# Patient Record
Sex: Female | Born: 1975 | Race: White | Hispanic: No | Marital: Married | Smoking: Never smoker
Health system: Southern US, Community
[De-identification: ages and names within clinical notes are randomized; demographics above are authoritative.]

## PROBLEM LIST (undated history)

## (undated) DIAGNOSIS — C50919 Malignant neoplasm of unspecified site of unspecified female breast: Secondary | ICD-10-CM

## (undated) DIAGNOSIS — R102 Pelvic and perineal pain: Secondary | ICD-10-CM

## (undated) DIAGNOSIS — R3915 Urgency of urination: Secondary | ICD-10-CM

## (undated) DIAGNOSIS — N390 Urinary tract infection, site not specified: Secondary | ICD-10-CM

## (undated) DIAGNOSIS — N838 Other noninflammatory disorders of ovary, fallopian tube and broad ligament: Secondary | ICD-10-CM

## (undated) DIAGNOSIS — Z923 Personal history of irradiation: Secondary | ICD-10-CM

## (undated) DIAGNOSIS — E785 Hyperlipidemia, unspecified: Secondary | ICD-10-CM

## (undated) DIAGNOSIS — R319 Hematuria, unspecified: Secondary | ICD-10-CM

## (undated) DIAGNOSIS — N809 Endometriosis, unspecified: Secondary | ICD-10-CM

## (undated) HISTORY — DX: Hyperlipidemia, unspecified: E78.5

## (undated) HISTORY — DX: Malignant neoplasm of unspecified site of unspecified female breast: C50.919

## (undated) HISTORY — PX: AUGMENTATION MAMMAPLASTY: SUR837

---

## 1998-09-21 HISTORY — PX: ANAL SPHINCTEROTOMY: SHX1140

## 1999-09-22 HISTORY — PX: ABSCESS DRAINAGE: SHX1119

## 2000-01-30 ENCOUNTER — Other Ambulatory Visit: Admission: RE | Admit: 2000-01-30 | Discharge: 2000-01-30 | Payer: Self-pay | Admitting: Obstetrics and Gynecology

## 2000-02-23 ENCOUNTER — Other Ambulatory Visit: Admission: RE | Admit: 2000-02-23 | Discharge: 2000-02-23 | Payer: Self-pay | Admitting: Obstetrics and Gynecology

## 2000-03-09 ENCOUNTER — Emergency Department (HOSPITAL_COMMUNITY): Admission: EM | Admit: 2000-03-09 | Discharge: 2000-03-09 | Payer: Self-pay | Admitting: Emergency Medicine

## 2000-07-30 ENCOUNTER — Other Ambulatory Visit: Admission: RE | Admit: 2000-07-30 | Discharge: 2000-07-30 | Payer: Self-pay | Admitting: Obstetrics and Gynecology

## 2001-03-18 ENCOUNTER — Other Ambulatory Visit: Admission: RE | Admit: 2001-03-18 | Discharge: 2001-03-18 | Payer: Self-pay | Admitting: Obstetrics and Gynecology

## 2002-05-19 ENCOUNTER — Other Ambulatory Visit: Admission: RE | Admit: 2002-05-19 | Discharge: 2002-05-19 | Payer: Self-pay | Admitting: Obstetrics and Gynecology

## 2003-07-13 ENCOUNTER — Other Ambulatory Visit: Admission: RE | Admit: 2003-07-13 | Discharge: 2003-07-13 | Payer: Self-pay | Admitting: Obstetrics and Gynecology

## 2004-08-08 ENCOUNTER — Other Ambulatory Visit: Admission: RE | Admit: 2004-08-08 | Discharge: 2004-08-08 | Payer: Self-pay | Admitting: Obstetrics and Gynecology

## 2007-08-31 ENCOUNTER — Encounter (INDEPENDENT_AMBULATORY_CARE_PROVIDER_SITE_OTHER): Payer: Self-pay | Admitting: Obstetrics and Gynecology

## 2007-08-31 ENCOUNTER — Ambulatory Visit (HOSPITAL_COMMUNITY): Admission: RE | Admit: 2007-08-31 | Discharge: 2007-09-01 | Payer: Self-pay | Admitting: Obstetrics and Gynecology

## 2007-08-31 HISTORY — PX: OTHER SURGICAL HISTORY: SHX169

## 2011-02-03 NOTE — Op Note (Signed)
NAMEColman Miller                ACCOUNT NO.:  [id number]   MEDICAL RECORD NO.:  [id number]          PATIENT TYPE:  AMB   LOCATION:  SDC                           FACILITY:  [facility]   PHYSICIAN:  Miguel Aschoff, M.D.       DATE OF BIRTH:  03/09/1976   DATE OF PROCEDURE:  08/31/2007  DATE OF DISCHARGE:                               OPERATIVE REPORT   PREOPERATIVE DIAGNOSIS:  Left adnexal mass, probable endometrioma.   POSTOPERATIVE DIAGNOSIS:  Large endometrioma involving left ovary.  Endometriosis involving cul-de-sac and right ovary.   PROCEDURE:  Diagnostic laparoscopy, left oophorectomy.   SURGEON:  Dr. Miguel Aschoff.   ASSISTANT:  Dr. Conley Simmonds.   ANESTHESIA:  General.   COMPLICATIONS:  None.   JUSTIFICATION:  The patient is a 35 year old white female noted on  physical examination to have a large mass in the left adnexa and marked  nodularity involving the cul-de-sac.  The mass has been observed now  several times, and the ultrasound findings are consistent with large  endometrioma.  Because of symptoms associated with this, the patient  requested a procedure be done to deal with the endometrium, and she  presents now to undergo laparoscopy and laparotomy if indicated.  The  patient would like to preserve for fertility, and the plan is to proceed  with as minimal surgery as it takes to resolve this issue.  Risks and  benefits have been discussed with the patient including the possibility  of a laparotomy and a hysterectomy and bilateral salpingo-oophorectomy.   PROCEDURE:  The patient was taken to the operating room, placed in the  supine position.  General anesthesia was administered without  difficulty.  She was then placed into in the dorsal lithotomy position,  prepped and draped in the usual sterile fashion.  Bladder was  catheterized, and Hulka tenaculum was placed through the cervix and  held.  Attention was directed to the umbilicus where a small  infraumbilical  incision was made.  A Veress needle was inserted, and  then the abdomen was insufflated with 3 liters CO2.  Following  insufflation, the trocar to the laparoscope was placed followed by  laparoscope itself.  Then, under direct visualization, two accessory  ports were established.  A 5-mm port was established in the right lower  quadrant, and 11-mm port was established in the left lower performed  quadrant, both under direct visualization.  At this point, systematic  inspection of the pelvis was carried out.  This revealed the uterus to  be anterior, normal size and shape.  Anterior bladder and peritoneum was  unremarkable.  The right tube was inspected, traced out to its  fimbriated end which was noted be wrapped around the ovary, on the right  side, the right ovary was noted be adherent to the lateral pelvic  sidewall with the tube adherent to the right ovary.  The fimbriae,  however, were fine and delicate.  On the left side, there was a large  mass encompassing the ovary approximately 6 cm in size.  This was  somewhat adherent to the  lateral pelvic sidewall but was able to be  freed with blunt dissection.  It was apparent at this point this  represented a large endometrioma involving the ovary.  The left tube,  however, appeared to be within normal limits.  In the cul-de-sac, there  were marked implants noted within the cul-de-sac of endometriosis, and  there appeared to be some obliteration of the most distal portion of the  cul-de-sac with endometriosis.  Intestinal surfaces were unremarkable.  However, it did appear that the appendix was being pulled somewhere down  to the pelvis near the cul-de-sac.  There were no other remarkable  findings noted in the abdomen.  At this point, once the ovary was freed  off the lateral pelvic sidewall, it was elected to proceed with a left  oophorectomy.  This was done using the gyrus unit, and meso-ovarian  ligament was identified, grasped,  cauterized and then cut, and then this  continued along the meso-ovarian ligament until it was possible to free  the left ovary from the left tube.  Care was taken to avoid any damage  to the distal end of the fimbriated tube.  It was possible to remove the  ovary and _it was_________  placed in the cul-de-sac.  Inspection was  then made for hemostasis with the abdomen being irrigated with saline  via Nezhat suction irrigation unit.  Inspection for hemostasis appeared  to be excellent.  At that this point, the EndoCatch bag was introduced,  and the left ovary was placed into the EndoCatch bag and brought out  through the left lower quadrant incision prior to removing specimen from  the abdomen.  Final inspection was made for hemostasis.  Again,  hemostasis appeared be excellent.  In an effort to reduce the number of  adhesions, Intercede was placed in the cul-de-sac to try to avoid  adherence of the bowel and the tube into this area.  After the intercede  was placed, the left lower quadrant incision was extended proximally by  2 cm and then was possible to remove the specimen of the left ovary via  the EndoCatch bag.  Once this was done, all instruments were removed.  CO2 was allowed to escape in the left lower quadrant.  This incision was  closed using a figure-of-eight suture of 0 Vicryl to close the fascia.  Two such sutures were placed, and the fascia was successfully closed.  Subcutaneous tissue was then closed using interrupted 0 Vicryl suture,  and the remainder of the skin incisions were closed using subcuticular 4-  0 Vicryl.  Portal sites were then injected with a total of 10 mL of 1%  Xylocaine.  Steri-Strips were applied.  Hulka tenaculum was removed, and  the patient was reversed from the anesthetic and taken to the recovery  room in satisfactory condition.  The estimated blood loss was  approximately 20 mL.  The patient tolerated the procedure well and went  to the recovery  room in satisfactory condition.      Miguel Aschoff, M.D.  Electronically Signed     AR/MEDQ  D:  08/31/2007  T:  09/01/2007  Job:  [id number]

## 2011-02-06 NOTE — Discharge Summary (Signed)
NAMEColman Miller                ACCOUNT NO.:  [id number]   MEDICAL RECORD NO.:  [id number]          PATIENT TYPE:  OIB   LOCATION:  9316                          FACILITY:  [facility]   PHYSICIAN:  Miguel Aschoff, M.D.       DATE OF BIRTH:  1976-01-11   DATE OF ADMISSION:  08/31/2007  DATE OF DISCHARGE:  09/01/2007                               DISCHARGE SUMMARY   ADMISSION DIAGNOSIS:  Left adnexal mass.   FINAL DIAGNOSIS:  Endometrioma of left ovary.   OPERATIONS AND PROCEDURES:  Laparoscopy with left oophorectomy.   BRIEF HISTORY:  The patient is a 35 year old white female noted to have  a large mass on clinical examination and nodularity located in the cul-  de-sac.  Ultrasound examination suggested that this mass represented an  endometrioma and because of its nature, failure to resolve and clinical  symptoms, she presented to the hospital to undergo laparoscopy and  laparotomy, if indicated.   HOSPITAL COURSE:  Preoperative studies were obtained and then on  August 31, 2007 under general anesthesia laparoscopy was carried out  and confirmed the presence of a large endometrioma involving the left  ovary.  The endometrioma was removed by laparoscopically removing the  left ovary.  This was done without difficulty.  The patient's  postoperative course was essentially uncomplicated.  She tolerated  increased ambulation and diet well and by September 01, 2007 was in  satisfactory condition to be discharged home.   MEDICATIONS FOR HOME:  1. Tylox one every three hours as needed for pain.  2. Doxycycline one twice a day x7 days.   DIET:  She was sent home on a regular diet.   ACTIVITY:  Told to do no heavy lifting, place nothing per vagina for two  weeks.   FOLLOW-UP VISIT:  In four weeks and call if there were any problems such  as fever, pain or heavy bleeding.  Her hemoglobin on discharge was 1,  white count was 9800.   Final diagnosis on the pathology specimen revealed benign  ovary with  hemorrhagic cyst consistent with endometrioma.      Miguel Aschoff, M.D.  Electronically Signed     AR/MEDQ  D:  09/15/2007  T:  09/15/2007  Job:  [id number]

## 2011-06-29 LAB — APTT: aPTT: 34

## 2011-06-29 LAB — COMPREHENSIVE METABOLIC PANEL
Albumin: 4
BUN: 6
Calcium: 9.4
Creatinine, Ser: 0.63
Total Protein: 6.8

## 2011-06-29 LAB — DIFFERENTIAL
Lymphocytes Relative: 40
Monocytes Absolute: 0.4
Monocytes Relative: 10
Neutro Abs: 2.1

## 2011-06-29 LAB — CBC
HCT: 40.4
MCV: 91.1
MCV: 92.3
Platelets: 386
RBC: 3.54 — ABNORMAL LOW
RDW: 12.5
WBC: 9.8

## 2011-06-29 LAB — PROTIME-INR: Prothrombin Time: 13.4

## 2011-07-10 ENCOUNTER — Other Ambulatory Visit: Payer: Self-pay | Admitting: Obstetrics and Gynecology

## 2012-04-04 ENCOUNTER — Encounter (INDEPENDENT_AMBULATORY_CARE_PROVIDER_SITE_OTHER): Payer: Self-pay

## 2012-04-27 ENCOUNTER — Ambulatory Visit (INDEPENDENT_AMBULATORY_CARE_PROVIDER_SITE_OTHER): Payer: Self-pay | Admitting: Surgery

## 2012-05-30 ENCOUNTER — Ambulatory Visit (INDEPENDENT_AMBULATORY_CARE_PROVIDER_SITE_OTHER): Payer: Self-pay | Admitting: General Surgery

## 2012-06-21 HISTORY — PX: LAPAROSCOPIC CHOLECYSTECTOMY: SUR755

## 2013-07-14 ENCOUNTER — Other Ambulatory Visit: Payer: Self-pay | Admitting: Obstetrics and Gynecology

## 2013-08-27 ENCOUNTER — Inpatient Hospital Stay (HOSPITAL_COMMUNITY)
Admission: AD | Admit: 2013-08-27 | Discharge: 2013-08-27 | Disposition: A | Discharge status: Home / Self Care | Payer: 59 | Source: Ambulatory Visit | Attending: Obstetrics and Gynecology | Admitting: Obstetrics and Gynecology

## 2013-08-27 ENCOUNTER — Encounter (HOSPITAL_COMMUNITY): Payer: Self-pay | Admitting: *Deleted

## 2013-08-27 DIAGNOSIS — N83209 Unspecified ovarian cyst, unspecified side: Secondary | ICD-10-CM | POA: Insufficient documentation

## 2013-08-27 DIAGNOSIS — N390 Urinary tract infection, site not specified: Secondary | ICD-10-CM | POA: Insufficient documentation

## 2013-08-27 DIAGNOSIS — R109 Unspecified abdominal pain: Secondary | ICD-10-CM | POA: Insufficient documentation

## 2013-08-27 DIAGNOSIS — R319 Hematuria, unspecified: Secondary | ICD-10-CM | POA: Insufficient documentation

## 2013-08-27 DIAGNOSIS — N83201 Unspecified ovarian cyst, right side: Secondary | ICD-10-CM

## 2013-08-27 MED ORDER — OXYCODONE-ACETAMINOPHEN 5-325 MG PO TABS
1.0000 | ORAL_TABLET | Freq: Once | ORAL | Status: AC
  Administered 2013-08-27: 1 via ORAL
  Filled 2013-08-27: qty 1

## 2013-08-27 MED ORDER — PHENAZOPYRIDINE HCL 100 MG PO TABS
200.0000 mg | ORAL_TABLET | Freq: Once | ORAL | Status: AC
  Administered 2013-08-27: 200 mg via ORAL
  Filled 2013-08-27: qty 2

## 2013-08-27 MED ORDER — PHENAZOPYRIDINE HCL 200 MG PO TABS: 200.0000 mg | ORAL_TABLET | Freq: Three times a day (TID) | ORAL | Status: DC | PRN

## 2013-08-27 MED ORDER — NITROFURANTOIN MONOHYD MACRO 100 MG PO CAPS
100.0000 mg | ORAL_CAPSULE | Freq: Once | ORAL | Status: AC
  Administered 2013-08-27: 100 mg via ORAL
  Filled 2013-08-27: qty 1

## 2013-08-27 MED ORDER — NITROFURANTOIN MONOHYD MACRO 100 MG PO CAPS: 100.0000 mg | ORAL_CAPSULE | Freq: Two times a day (BID) | ORAL | Status: DC

## 2013-08-27 NOTE — MAU Provider Note (Signed)
History     CSN: [id number]  Arrival date and time: 08/27/13 4098   First Provider Initiated Contact with Patient 08/27/13 [phone]      Chief Complaint  Patient presents with  . Abdominal Pain  . Hematuria   HPI Carly Miller 37 y.o. transported via EMS from [facility] to [facility].  Client has known ovarian cyst and is planning to have surgery on Thursday for removal.  Has been having pain and urinary urgency since Thanksgiving.  Today the urgency was worse and she was having blood in her urine.  Was having difficulty managing the pain so she went to the closest ER.  Records reviewed from [facility].  Was treated for UTI today and given IV antibiotics.  Was given Morphine for the abdominal pain.  Currently her pain is 5/10 and she is feeling less pan than earlier today.   OB History   Grav Para Term Preterm Abortions TAB SAB Ect Mult Living   1 1 1  0 0 0 0 0 0 1      History reviewed. No pertinent past medical history.  Past Surgical History  Procedure Laterality Date  . Anal sphincteroplasty    . Salpingoophorectomy      left  . Cholecystectomy    . Abscess drainage      Family History  Problem Relation Age of Onset  . Asthma Mother   . Thyroid disease Mother   . Asthma Father   . Diabetes Father   . Thyroid disease Father   . Kidney disease Sister   . Thyroid disease Maternal Aunt   . Thyroid disease Maternal Uncle   . Cancer Maternal Grandmother     History  Substance Use Topics  . Smoking status: Never Smoker   . Smokeless tobacco: Not on file  . Alcohol Use: No    Allergies:  Allergies  Allergen Reactions  . Aleve [Naproxen Sodium]     "Feel like I'm choking when I take it"    Prescriptions prior to admission  Medication Sig Dispense Refill  . acetaminophen (TYLENOL) 500 MG tablet Take 1,000 mg by mouth every 6 (six) hours as needed.      Marland Kitchen HYDROcodone-acetaminophen (NORCO/VICODIN) 5-325 MG per tablet Take 1 tablet by mouth every 6 (six)  hours as needed for moderate pain.      . Multiple Vitamin (MULTIVITAMIN) capsule Take 1 capsule by mouth daily.        Review of Systems  Constitutional: Negative for fever.  Gastrointestinal: Positive for abdominal pain. Negative for nausea and vomiting.  Genitourinary: Positive for dysuria, urgency and hematuria.   Physical Exam   Blood pressure 96/54, pulse 94, temperature 98.1 F (36.7 C), temperature source Oral, resp. rate 18, last menstrual period 08/14/2013, SpO2 98.00%.  Physical Exam  Nursing note and vitals reviewed. Constitutional: She is oriented to person, place, and time. She appears well-developed and well-nourished.  HENT:  Head: Normocephalic.  Eyes: EOM are normal.  Neck: Neck supple.  GI: Soft. There is tenderness. There is no rebound and no guarding.  Musculoskeletal: Normal range of motion.  Some flank pain in the right side, but is not true CVA tenderness.  Neurological: She is alert and oriented to person, place, and time.  Skin: Skin is warm and dry.  Psychiatric: She has a normal mood and affect.    MAU Course  Procedures Urine culture ordered.  MDM 0510 Records from [place] reviewed. 4782  Consult with Dr. Vincente Poli re:  plan of care   Assessment and Plan  Ovarian cyst UTI  Plan Will give pyridium and Macrobid for UTI.  Will prescribe both for her. Will give Rx for vicodin for pain control. Will discharge.  Call the office if you are not doing well. Plan to have your scheduled surgery on Thursday.   Milano Rosevear 08/27/2013, 5:29 AM

## 2013-08-27 NOTE — Progress Notes (Signed)
Lilyan Punt Np notified of pt's increasing pain. Pain med ordered

## 2013-08-27 NOTE — Progress Notes (Signed)
Dr Vincente Poli notified of pt's arrival to MAU and status. Requested MAU provider to see pt and call MD. Lilyan Punt NP on unit and aware

## 2013-08-27 NOTE — MAU Note (Addendum)
Pt admitted to RM #6 via [facility] from [facility]. Pt has known mass R ovary and for surgery this Thurs with Dr Rana Snare. Pt presented to [facility] with pain and hematuria. Pt alert and oriented.

## 2013-08-28 LAB — URINE CULTURE
Colony Count: NO GROWTH
Culture: NO GROWTH

## 2013-08-29 ENCOUNTER — Encounter (HOSPITAL_BASED_OUTPATIENT_CLINIC_OR_DEPARTMENT_OTHER): Payer: Self-pay | Admitting: *Deleted

## 2013-08-30 ENCOUNTER — Encounter (HOSPITAL_BASED_OUTPATIENT_CLINIC_OR_DEPARTMENT_OTHER): Payer: Self-pay | Admitting: *Deleted

## 2013-08-30 NOTE — Progress Notes (Signed)
NPO AFTER MN. ARRIVE AT 0600. PRE-OP ORDERS PENDING.  NEEDS HG AND URING. MAY TAKE HYDROCODONE IF NEEDED W/ SIPS OF WATER.

## 2013-08-30 NOTE — H&P (Addendum)
Carly Miller presents today for evaluation of pelvic pain, history of right ovarian cyst.  She is scheduled for surgical evaluation of probable endometrioma in two days.  Carly Miller's history has progressively worsened just in this last week.  She has a known history of endometriosis and a right ovarian cyst.  It has gotten worse starting on Thanksgiving Day.  She presented shortly thereafter.  To the point that she has really been lying around in the bed and not being able to take care of herself or her family because she is in so much pain.  Currently on Vicodin.  She really does not like taking pain medicine.  She denies any fever or chills.  We had done an ultrasound shortly after that showing that there had been increase in the size of the cyst to about the 7-8 cm area.  The plan was to proceed laparoscopically on Thursday.  However, just two days ago on the weekend, she presented to the emergency room in [place] with gross hematuria and pain.  They did a CT scan which showed an 8.8 cm mass on the right side that showed it is possibly coming from the uterine wall or right adnexal area.  They transferred her by EMS to [facility] for further evaluation.  It showed a white count of 15, hemoglobin of 9.7.  She was placed on Macrobid and Pyridium and sent home with a UTI.  In the last 48 hours, her pain has actually improved fairly significantly.  She is currently not having gross hematuria, so the pain has improved.  She is not sure if this is due to the antibiotics or Pyridium, however, in the last day or so, she has really not tolerated the antibiotics well and has not been able to keep them down because she throws them up immediately.  In the past, has not had any problems with this.  Again, denies any fever or chills.  Really has not eaten a lot today other than some crackers. O: Physical exam:  Carly Miller appears to be fairly fatigued and in mild distress.  Abdomen is soft and nontender, nondistended, no rebound, no  guarding, no peritoneal signs, no flank pain.  Pelvic exam:  Uterus is anteverted and mobile.  You can palpate the mass on the right adnexal side just above the uterus and to the right adnexal area.  It is tender to deep palpation 2/5.  Urethra is nontender.  The bladder is relatively nontender.  Left adnexa is nontender.  Pelvic ultrasound was carried out without difficulty.  It shows the right ovary is now 6.1 cm, so it is actually much smaller than it was last week when we did an ultrasound.  Significantly smaller than the CT scan.  There is no free fluid.  There is flow seen to the ovary but there is no blood flow within the cyst.  The cyst appears to be more hemorrhagic in nature rather than endometriotic.  A&P: Pelvic pain, worsening, now with hematuria and possible urinary tract infection.  Urinalysis today was not really useful since she is on Pyridium.  It does show large red blood cells and leukocytes.  Discussed different options with Carly Miller.  Because she does not have gross hematuria we feel like she is in improving and she is afebrile.  Unable to keep the Macrobid down.  I wrote her for some Zofran.  I also changed her to Cipro 500 mg b.i.d. for 5 days.  Certainly if the pain or fever does  come back before this, she would need hospitalization with IV antibiotics.  Otherwise we discussed proceeding with surgical intervention.  We were going to proceed with laparoscopic evaluation of the abdomen and pelvis, ablation of endometriosis if there, evaluation of the right ovary and ovarian cyst.  If this is endometriotic or hemorrhagic, we are going to try to preserve the ovary but it is okay to remove the ovary if necessary, even though that would leave her essentially without ovaries and menopausal.  She does understand this and wishes that to occur if necessary.   If we have to remove the uterus, she is okay with doing a hysterectomy and removing the ovary.  She would prefer to do it at the time of this  surgery if the ovary has to be removed, she wants a hysterectomy as well.   Certainly if it is very involved as far as abscesses or bladder involvement or ureter involvement, we may need a urologist involved.  She does understand that as well.  So the plan would be hopefully to remove the cyst, preserve the ovary, or possibly remove the tube and ovary at that time.  We discussed the risks and benefits of the procedure at length including but not limited to risk of infection, bleeding, damage to bowel, bladder, ureter, obviously the ovary, the possibility of having to remove the uterus and/or ovary, removing the ovary.  She does give her informed consent and wishes to proceed.  I did spend nearly an hour and a half today face-to-face in evaluation and ultrasounds and reviewing records with Carly Miller today in the office.   Dineen Kid Rana Snare, MD/rg    This patient has been seen and examined.   All of her questions were answered.  Labs and vital signs reviewed.  Informed consent has been obtained.  The History and Physical is current. 08/31/13 0715 DL

## 2013-08-31 ENCOUNTER — Ambulatory Visit (HOSPITAL_BASED_OUTPATIENT_CLINIC_OR_DEPARTMENT_OTHER): Payer: 59 | Admitting: Anesthesiology

## 2013-08-31 ENCOUNTER — Encounter (HOSPITAL_COMMUNITY)
Admission: RE | Disposition: A | Discharge status: Home / Self Care | Payer: Self-pay | Source: Ambulatory Visit | Attending: Obstetrics and Gynecology

## 2013-08-31 ENCOUNTER — Encounter (HOSPITAL_BASED_OUTPATIENT_CLINIC_OR_DEPARTMENT_OTHER): Payer: Self-pay | Admitting: *Deleted

## 2013-08-31 ENCOUNTER — Encounter (HOSPITAL_BASED_OUTPATIENT_CLINIC_OR_DEPARTMENT_OTHER): Payer: 59 | Admitting: Anesthesiology

## 2013-08-31 ENCOUNTER — Inpatient Hospital Stay (HOSPITAL_BASED_OUTPATIENT_CLINIC_OR_DEPARTMENT_OTHER)
Admission: RE | Admit: 2013-08-31 | Discharge: 2013-09-03 | DRG: 743 | Disposition: A | Discharge status: Home / Self Care | Payer: 59 | Source: Ambulatory Visit | Attending: Obstetrics and Gynecology | Admitting: Obstetrics and Gynecology

## 2013-08-31 DIAGNOSIS — N80129 Deep endometriosis of ovary, unspecified ovary: Secondary | ICD-10-CM

## 2013-08-31 DIAGNOSIS — N80109 Endometriosis of ovary, unspecified side, unspecified depth: Principal | ICD-10-CM | POA: Diagnosis present

## 2013-08-31 DIAGNOSIS — Z90721 Acquired absence of ovaries, unilateral: Secondary | ICD-10-CM

## 2013-08-31 DIAGNOSIS — N736 Female pelvic peritoneal adhesions (postinfective): Secondary | ICD-10-CM

## 2013-08-31 DIAGNOSIS — N7093 Salpingitis and oophoritis, unspecified: Secondary | ICD-10-CM

## 2013-08-31 DIAGNOSIS — N801 Endometriosis of ovary: Principal | ICD-10-CM | POA: Diagnosis present

## 2013-08-31 DIAGNOSIS — R310 Gross hematuria: Secondary | ICD-10-CM | POA: Diagnosis present

## 2013-08-31 DIAGNOSIS — N949 Unspecified condition associated with female genital organs and menstrual cycle: Secondary | ICD-10-CM | POA: Diagnosis present

## 2013-08-31 DIAGNOSIS — N83209 Unspecified ovarian cyst, unspecified side: Secondary | ICD-10-CM | POA: Diagnosis present

## 2013-08-31 DIAGNOSIS — D649 Anemia, unspecified: Secondary | ICD-10-CM | POA: Diagnosis present

## 2013-08-31 DIAGNOSIS — Z5331 Laparoscopic surgical procedure converted to open procedure: Secondary | ICD-10-CM

## 2013-08-31 DIAGNOSIS — Z90710 Acquired absence of both cervix and uterus: Secondary | ICD-10-CM | POA: Insufficient documentation

## 2013-08-31 HISTORY — DX: Other noninflammatory disorders of ovary, fallopian tube and broad ligament: N83.8

## 2013-08-31 HISTORY — PX: LAPAROSCOPY: SHX197

## 2013-08-31 HISTORY — DX: Pelvic and perineal pain: R10.2

## 2013-08-31 HISTORY — DX: Hematuria, unspecified: R31.9

## 2013-08-31 HISTORY — DX: Urgency of urination: R39.15

## 2013-08-31 HISTORY — PX: ABDOMINAL HYSTERECTOMY: SHX81

## 2013-08-31 HISTORY — DX: Endometriosis, unspecified: N80.9

## 2013-08-31 HISTORY — DX: Urinary tract infection, site not specified: N39.0

## 2013-08-31 LAB — POCT HEMOGLOBIN-HEMACUE: Hemoglobin: 12.5 g/dL (ref 12.0–15.0)

## 2013-08-31 SURGERY — LAPAROSCOPY, DIAGNOSTIC
Anesthesia: General | Site: Abdomen

## 2013-08-31 MED ORDER — LIDOCAINE HCL (CARDIAC) 20 MG/ML IV SOLN
INTRAVENOUS | Status: DC | PRN
  Administered 2013-08-31: 70 mg via INTRAVENOUS

## 2013-08-31 MED ORDER — DEXTROSE 5 % IV SOLN
2.0000 g | INTRAVENOUS | Status: AC
  Administered 2013-08-31: 2 g via INTRAVENOUS
  Filled 2013-08-31: qty 2

## 2013-08-31 MED ORDER — INDIGOTINDISULFONATE SODIUM 8 MG/ML IJ SOLN
INTRAMUSCULAR | Status: DC | PRN
  Administered 2013-08-31: 5 mL via INTRAVENOUS

## 2013-08-31 MED ORDER — HYDROMORPHONE HCL PF 1 MG/ML IJ SOLN
INTRAMUSCULAR | Status: AC
  Filled 2013-08-31: qty 1

## 2013-08-31 MED ORDER — EPHEDRINE SULFATE 50 MG/ML IJ SOLN
INTRAMUSCULAR | Status: DC | PRN
  Administered 2013-08-31: 10 mg via INTRAVENOUS

## 2013-08-31 MED ORDER — MIDAZOLAM HCL 5 MG/5ML IJ SOLN
INTRAMUSCULAR | Status: DC | PRN
  Administered 2013-08-31: 2 mg via INTRAVENOUS

## 2013-08-31 MED ORDER — CEFOTETAN DISODIUM-DEXTROSE 2-2.08 GM-% IV SOLR
INTRAVENOUS | Status: AC
  Filled 2013-08-31: qty 50

## 2013-08-31 MED ORDER — MIDAZOLAM HCL 2 MG/2ML IJ SOLN
INTRAMUSCULAR | Status: AC
  Filled 2013-08-31: qty 2

## 2013-08-31 MED ORDER — OXYCODONE-ACETAMINOPHEN 5-325 MG PO TABS
1.0000 | ORAL_TABLET | ORAL | Status: DC | PRN
  Administered 2013-09-01 – 2013-09-03 (×10): 2 via ORAL
  Filled 2013-08-31 (×10): qty 2

## 2013-08-31 MED ORDER — MENTHOL 3 MG MT LOZG
1.0000 | LOZENGE | OROMUCOSAL | Status: DC | PRN
  Filled 2013-08-31: qty 9

## 2013-08-31 MED ORDER — FENTANYL CITRATE 0.05 MG/ML IJ SOLN
INTRAMUSCULAR | Status: AC
  Filled 2013-08-31: qty 4

## 2013-08-31 MED ORDER — NEOSTIGMINE METHYLSULFATE 1 MG/ML IJ SOLN
INTRAMUSCULAR | Status: DC | PRN
  Administered 2013-08-31: 4 mg via INTRAVENOUS

## 2013-08-31 MED ORDER — DEXAMETHASONE SODIUM PHOSPHATE 4 MG/ML IJ SOLN
INTRAMUSCULAR | Status: DC | PRN
  Administered 2013-08-31: 10 mg via INTRAVENOUS

## 2013-08-31 MED ORDER — ZOLPIDEM TARTRATE 5 MG PO TABS
5.0000 mg | ORAL_TABLET | Freq: Every evening | ORAL | Status: DC | PRN
  Administered 2013-09-01: 5 mg via ORAL
  Filled 2013-08-31: qty 1

## 2013-08-31 MED ORDER — DIPHENHYDRAMINE HCL 50 MG/ML IJ SOLN: 12.5000 mg | Freq: Four times a day (QID) | INTRAMUSCULAR | Status: DC | PRN

## 2013-08-31 MED ORDER — IBUPROFEN 600 MG PO TABS
600.0000 mg | ORAL_TABLET | Freq: Four times a day (QID) | ORAL | Status: DC | PRN
  Administered 2013-09-01 – 2013-09-02 (×2): 600 mg via ORAL
  Filled 2013-08-31 (×2): qty 1

## 2013-08-31 MED ORDER — ACETAMINOPHEN 10 MG/ML IV SOLN
INTRAVENOUS | Status: DC | PRN
  Administered 2013-08-31: 1000 mg via INTRAVENOUS

## 2013-08-31 MED ORDER — DIPHENHYDRAMINE HCL 12.5 MG/5ML PO ELIX: 12.5000 mg | ORAL_SOLUTION | Freq: Four times a day (QID) | ORAL | Status: DC | PRN

## 2013-08-31 MED ORDER — METOCLOPRAMIDE HCL 5 MG/ML IJ SOLN
INTRAMUSCULAR | Status: DC | PRN
  Administered 2013-08-31: 10 mg via INTRAVENOUS

## 2013-08-31 MED ORDER — HYDROMORPHONE HCL PF 1 MG/ML IJ SOLN
0.2000 mg | INTRAMUSCULAR | Status: DC | PRN
  Administered 2013-08-31: 0.5 mg via INTRAVENOUS
  Filled 2013-08-31: qty 1

## 2013-08-31 MED ORDER — LACTATED RINGERS IV SOLN
INTRAVENOUS | Status: DC
  Administered 2013-08-31: 11:00:00 via INTRAVENOUS
  Filled 2013-08-31: qty 1000

## 2013-08-31 MED ORDER — LACTATED RINGERS IV SOLN
INTRAVENOUS | Status: DC
  Administered 2013-08-31 (×3): via INTRAVENOUS
  Filled 2013-08-31: qty 1000

## 2013-08-31 MED ORDER — PROPOFOL 10 MG/ML IV BOLUS
INTRAVENOUS | Status: DC | PRN
  Administered 2013-08-31: 200 mg via INTRAVENOUS

## 2013-08-31 MED ORDER — ROCURONIUM BROMIDE 100 MG/10ML IV SOLN
INTRAVENOUS | Status: DC | PRN
  Administered 2013-08-31 (×2): 10 mg via INTRAVENOUS
  Administered 2013-08-31: 30 mg via INTRAVENOUS
  Administered 2013-08-31 (×2): 10 mg via INTRAVENOUS

## 2013-08-31 MED ORDER — HYDROMORPHONE 0.3 MG/ML IV SOLN
INTRAVENOUS | Status: DC
  Administered 2013-08-31: 2.79 mg via INTRAVENOUS
  Administered 2013-08-31 (×2): via INTRAVENOUS
  Administered 2013-08-31: 3.39 mg via INTRAVENOUS
  Administered 2013-09-01: 1.19 mg via INTRAVENOUS
  Administered 2013-09-01: 1.79 mg via INTRAVENOUS
  Filled 2013-08-31 (×2): qty 25

## 2013-08-31 MED ORDER — DEXTROSE-NACL 5-0.45 % IV SOLN
INTRAVENOUS | Status: DC
  Administered 2013-08-31 – 2013-09-01 (×3): via INTRAVENOUS

## 2013-08-31 MED ORDER — ONDANSETRON HCL 4 MG/2ML IJ SOLN
INTRAMUSCULAR | Status: DC | PRN
  Administered 2013-08-31: 4 mg via INTRAVENOUS

## 2013-08-31 MED ORDER — GLYCOPYRROLATE 0.2 MG/ML IJ SOLN
INTRAMUSCULAR | Status: DC | PRN
  Administered 2013-08-31: 0.2 mg via INTRAVENOUS
  Administered 2013-08-31: 0.4 mg via INTRAVENOUS

## 2013-08-31 MED ORDER — SODIUM CHLORIDE 0.9 % IJ SOLN: 9.0000 mL | INTRAMUSCULAR | Status: DC | PRN

## 2013-08-31 MED ORDER — HYDROMORPHONE HCL PF 1 MG/ML IJ SOLN
0.2500 mg | INTRAMUSCULAR | Status: DC | PRN
  Administered 2013-08-31 (×4): 0.25 mg via INTRAVENOUS
  Filled 2013-08-31: qty 1

## 2013-08-31 MED ORDER — HEMOSTATIC AGENTS (NO CHARGE) OPTIME
TOPICAL | Status: DC | PRN
  Administered 2013-08-31: 1 via TOPICAL

## 2013-08-31 MED ORDER — FENTANYL CITRATE 0.05 MG/ML IJ SOLN
INTRAMUSCULAR | Status: AC
  Filled 2013-08-31: qty 6

## 2013-08-31 MED ORDER — DEXTROSE 5 % IV SOLN
1.0000 g | Freq: Two times a day (BID) | INTRAVENOUS | Status: DC
  Administered 2013-08-31 – 2013-09-03 (×6): 1 g via INTRAVENOUS
  Filled 2013-08-31 (×7): qty 1

## 2013-08-31 MED ORDER — LACTATED RINGERS IR SOLN
Status: DC | PRN
  Administered 2013-08-31: 3000 mL

## 2013-08-31 MED ORDER — ONDANSETRON HCL 4 MG/2ML IJ SOLN: 4.0000 mg | Freq: Four times a day (QID) | INTRAMUSCULAR | Status: DC | PRN

## 2013-08-31 MED ORDER — NALOXONE HCL 0.4 MG/ML IJ SOLN: 0.4000 mg | INTRAMUSCULAR | Status: DC | PRN

## 2013-08-31 MED ORDER — PROMETHAZINE HCL 25 MG/ML IJ SOLN
6.2500 mg | INTRAMUSCULAR | Status: DC | PRN
  Filled 2013-08-31: qty 1

## 2013-08-31 MED ORDER — FENTANYL CITRATE 0.05 MG/ML IJ SOLN
INTRAMUSCULAR | Status: DC | PRN
  Administered 2013-08-31 (×7): 50 ug via INTRAVENOUS
  Administered 2013-08-31: 100 ug via INTRAVENOUS
  Administered 2013-08-31: 50 ug via INTRAVENOUS

## 2013-08-31 SURGICAL SUPPLY — 76 items
ADH SKN CLS APL DERMABOND .7 (GAUZE/BANDAGES/DRESSINGS)
APL SKNCLS STERI-STRIP NONHPOA (GAUZE/BANDAGES/DRESSINGS)
APPLICATOR COTTON TIP 6IN STRL (MISCELLANEOUS) ×3 IMPLANT
BAG SPEC RTRVL LRG 6X4 10 (ENDOMECHANICALS)
BAG URINE DRAINAGE (UROLOGICAL SUPPLIES) ×2 IMPLANT
BANDAGE ADHESIVE 1X3 (GAUZE/BANDAGES/DRESSINGS) IMPLANT
BENZOIN TINCTURE PRP APPL 2/3 (GAUZE/BANDAGES/DRESSINGS) IMPLANT
BLADE SURG 11 STRL SS (BLADE) ×3 IMPLANT
CANISTER SUCTION 1200CC (MISCELLANEOUS) ×2 IMPLANT
CANISTER SUCTION 2500CC (MISCELLANEOUS) ×2 IMPLANT
CATH FOLEY 2WAY SLVR  5CC 14FR (CATHETERS) ×1
CATH FOLEY 2WAY SLVR 5CC 14FR (CATHETERS) ×1 IMPLANT
CATH ROBINSON RED A/P 16FR (CATHETERS) ×3 IMPLANT
CLOTH BEACON ORANGE TIMEOUT ST (SAFETY) ×3 IMPLANT
COVER MAYO STAND STRL (DRAPES) ×4 IMPLANT
COVER TABLE BACK 60X90 (DRAPES) ×4 IMPLANT
DERMABOND ADVANCED (GAUZE/BANDAGES/DRESSINGS)
DERMABOND ADVANCED .7 DNX12 (GAUZE/BANDAGES/DRESSINGS) IMPLANT
DRAPE CAMERA CLOSED 9X96 (DRAPES) ×3 IMPLANT
DRAPE UNDERBUTTOCKS STRL (DRAPE) ×5 IMPLANT
DRAPE WARM FLUID 44X44 (DRAPE) ×2 IMPLANT
DRESSING TELFA 8X3 (GAUZE/BANDAGES/DRESSINGS) ×2 IMPLANT
ELECT REM PT RETURN 9FT ADLT (ELECTROSURGICAL) ×3
ELECTRODE REM PT RTRN 9FT ADLT (ELECTROSURGICAL) ×2 IMPLANT
FORCEPS CUTTING 33CM 5MM (CUTTING FORCEPS) IMPLANT
FORCEPS CUTTING 45CM 5MM (CUTTING FORCEPS) IMPLANT
GAUZE SPONGE 4X4 16PLY XRAY LF (GAUZE/BANDAGES/DRESSINGS) ×2 IMPLANT
GLOVE BIO SURGEON STRL SZ8 (GLOVE) ×3 IMPLANT
GLOVE BIOGEL M 6.5 STRL (GLOVE) ×2 IMPLANT
GLOVE BIOGEL M STER SZ 6 (GLOVE) ×2 IMPLANT
GLOVE BIOGEL M STRL SZ7.5 (GLOVE) ×4 IMPLANT
GLOVE BIOGEL PI IND STRL 6.5 (GLOVE) ×1 IMPLANT
GLOVE BIOGEL PI IND STRL 7.5 (GLOVE) ×1 IMPLANT
GLOVE BIOGEL PI INDICATOR 6.5 (GLOVE) ×1
GLOVE BIOGEL PI INDICATOR 7.5 (GLOVE) ×1
GLOVE SURG ORTHO 8.0 STRL STRW (GLOVE) ×5 IMPLANT
GOWN PREVENTION PLUS LG XLONG (DISPOSABLE) ×3 IMPLANT
GOWN PREVENTION PLUS XLARGE (GOWN DISPOSABLE) ×4 IMPLANT
GOWN STRL NON-REIN LRG LVL3 (GOWN DISPOSABLE) ×8 IMPLANT
HOLDER FOLEY CATH W/STRAP (MISCELLANEOUS) ×2 IMPLANT
IV NS 500ML (IV SOLUTION) ×15
IV NS 500ML BAXH (IV SOLUTION) ×5 IMPLANT
NDL INSUFFLATION 14GA 120MM (NEEDLE) ×1 IMPLANT
NEEDLE INSUFFLATION 14GA 120MM (NEEDLE) ×3 IMPLANT
NS IRRIG 500ML POUR BTL (IV SOLUTION) ×3 IMPLANT
PACK BASIN DAY SURGERY FS (CUSTOM PROCEDURE TRAY) ×3 IMPLANT
PACK LAPAROSCOPY II (CUSTOM PROCEDURE TRAY) ×3 IMPLANT
PAD OB MATERNITY 4.3X12.25 (PERSONAL CARE ITEMS) ×3 IMPLANT
PAD PREP 24X48 CUFFED NSTRL (MISCELLANEOUS) ×3 IMPLANT
POUCH SPECIMEN RETRIEVAL 10MM (ENDOMECHANICALS) IMPLANT
SCISSORS LAP 5X35 DISP (ENDOMECHANICALS) IMPLANT
SET IRRIG TUBING LAPAROSCOPIC (IRRIGATION / IRRIGATOR) ×2 IMPLANT
SOLUTION ANTI FOG 6CC (MISCELLANEOUS) ×3 IMPLANT
SOLUTION ELECTROLUBE (MISCELLANEOUS) ×3 IMPLANT
SPONGE GAUZE 4X4 12PLY (GAUZE/BANDAGES/DRESSINGS) ×2 IMPLANT
SPONGE LAP 18X18 X RAY DECT (DISPOSABLE) ×4 IMPLANT
STRIP CLOSURE SKIN 1/4X4 (GAUZE/BANDAGES/DRESSINGS) IMPLANT
SUT MNCRL 0 MO-4 VIOLET 18 CR (SUTURE) ×2 IMPLANT
SUT MON AB-0 CT1 36 (SUTURE) ×6 IMPLANT
SUT MONOCRYL 0 MO 4 18  CR/8 (SUTURE) ×2
SUT VIC AB 0 CT1 18XCR BRD 8 (SUTURE) ×2 IMPLANT
SUT VIC AB 0 CT1 36 (SUTURE) ×4 IMPLANT
SUT VIC AB 0 CT1 8-18 (SUTURE) ×6
SUT VICRYL 0 UR6 27IN ABS (SUTURE) ×3 IMPLANT
SUT VICRYL RAPIDE 3 0 (SUTURE) ×3 IMPLANT
SYR BULB IRRIGATION 50ML (SYRINGE) ×2 IMPLANT
TAPE CLOTH SURG 4X10 WHT LF (GAUZE/BANDAGES/DRESSINGS) ×2 IMPLANT
TOWEL OR 17X24 6PK STRL BLUE (TOWEL DISPOSABLE) ×6 IMPLANT
TRAY DSU PREP LF (CUSTOM PROCEDURE TRAY) ×3 IMPLANT
TROCAR Z-THREAD BLADED 11X100M (TROCAR) ×3 IMPLANT
TROCAR Z-THREAD BLADED 5X100MM (TROCAR) ×6 IMPLANT
TUBE CONNECTING 12X1/4 (SUCTIONS) ×2 IMPLANT
TUBING INSUFFLATION W/FILTER (TUBING) ×3 IMPLANT
WARMER LAPAROSCOPE (MISCELLANEOUS) ×3 IMPLANT
WATER STERILE IRR 1000ML POUR (IV SOLUTION) ×1 IMPLANT
WATER STERILE IRR 500ML POUR (IV SOLUTION) ×3 IMPLANT

## 2013-08-31 NOTE — Transfer of Care (Signed)
Immediate Anesthesia Transfer of Care Note  Patient: Carly Miller  Procedure(s) Performed: Procedure(s) (LRB): LAPAROSCOPY DIAGNOSTIC (N/A) HYSTERECTOMY ABDOMINAL, LYSIS OF ADHESIONS (N/A)  Patient Location: PACU  Anesthesia Type: General  Level of Consciousness: awake, alert  and oriented  Airway & Oxygen Therapy: Patient Spontanous Breathing and Patient connected to face mask oxygen  Post-op Assessment: Report given to PACU RN and Post -op Vital signs reviewed and stable  Post vital signs: Reviewed and stable  Complications: No apparent anesthesia complications

## 2013-08-31 NOTE — Anesthesia Preprocedure Evaluation (Signed)
Anesthesia Evaluation  Patient identified by MRN, date of birth, ID band Patient awake    Reviewed: Allergy & Precautions, H&P , NPO status , Patient's Chart, lab work & pertinent test results  Airway Mallampati: II TM Distance: >3 FB Neck ROM: Full    Dental  (+) Teeth Intact and Dental Advisory Given   Pulmonary neg pulmonary ROS,  breath sounds clear to auscultation        Cardiovascular negative cardio ROS  Rhythm:Regular Rate:Normal     Neuro/Psych negative neurological ROS  negative psych ROS   GI/Hepatic negative GI ROS, Neg liver ROS,   Endo/Other  negative endocrine ROS  Renal/GU negative Renal ROS  negative genitourinary   Musculoskeletal negative musculoskeletal ROS (+)   Abdominal   Peds negative pediatric ROS (+)  Hematology negative hematology ROS (+)   Anesthesia Other Findings   Reproductive/Obstetrics negative OB ROS Ovarian Cyst                           Anesthesia Physical Anesthesia Plan  ASA: I  Anesthesia Plan: General   Post-op Pain Management:    Induction: Intravenous  Airway Management Planned: Oral ETT  Additional Equipment:   Intra-op Plan:   Post-operative Plan: Extubation in OR  Informed Consent: I have reviewed the patients History and Physical, chart, labs and discussed the procedure including the risks, benefits and alternatives for the proposed anesthesia with the patient or authorized representative who has indicated his/her understanding and acceptance.   Dental advisory given  Plan Discussed with: CRNA  Anesthesia Plan Comments:         Anesthesia Quick Evaluation

## 2013-08-31 NOTE — Procedures (Signed)
Preop:  Right ov cyst, pelvic pain, history of endometriosis Postop:  Same plus dense pelvic adhesions, large right tubovarian abscess Procedure:  Dx Laporoscopy, larporotomy with TAH, RSO, Left Slapingectomy, LOA Surgeon Rana Snare Asst Adkins EBL 350cc Foley Anerobic and aerobic cultures sent Uterus, right tube and ovary and left tube sent to path PACU in stable condition

## 2013-08-31 NOTE — Anesthesia Procedure Notes (Signed)
Procedure Name: Intubation Date/Time: 08/31/2013 7:37 AM Performed by: Norva Pavlov Pre-anesthesia Checklist: Patient identified, Emergency Drugs available, Suction available and Patient being monitored Patient Re-evaluated:Patient Re-evaluated prior to inductionOxygen Delivery Method: Circle System Utilized Preoxygenation: Pre-oxygenation with 100% oxygen Intubation Type: IV induction Ventilation: Mask ventilation without difficulty Laryngoscope Size: Mac and 3 Grade View: Grade I Tube type: Oral Tube size: 7.0 mm Number of attempts: 1 Airway Equipment and Method: stylet and oral airway Placement Confirmation: ETT inserted through vocal cords under direct vision,  positive ETCO2 and breath sounds checked- equal and bilateral Secured at: 21 cm Tube secured with: Tape Dental Injury: Teeth and Oropharynx as per pre-operative assessment

## 2013-09-01 ENCOUNTER — Encounter (HOSPITAL_BASED_OUTPATIENT_CLINIC_OR_DEPARTMENT_OTHER): Payer: Self-pay | Admitting: Obstetrics and Gynecology

## 2013-09-01 LAB — CBC
HCT: 32.8 % — ABNORMAL LOW (ref 36.0–46.0)
Hemoglobin: 10.6 g/dL — ABNORMAL LOW (ref 12.0–15.0)
MCH: 28.4 pg (ref 26.0–34.0)
MCHC: 32.3 g/dL (ref 30.0–36.0)
MCV: 87.9 fL (ref 78.0–100.0)
Platelets: 758 10*3/uL — ABNORMAL HIGH (ref 150–400)
RBC: 3.73 MIL/uL — ABNORMAL LOW (ref 3.87–5.11)
RDW: 13.1 % (ref 11.5–15.5)
WBC: 19.2 10*3/uL — ABNORMAL HIGH (ref 4.0–10.5)

## 2013-09-01 LAB — URINE CULTURE
Colony Count: NO GROWTH
Culture: NO GROWTH

## 2013-09-01 NOTE — Anesthesia Postprocedure Evaluation (Signed)
Anesthesia Post Note  Patient: Carly Miller  Procedure(s) Performed: Procedure(s) (LRB): LAPAROSCOPY DIAGNOSTIC (N/A) HYSTERECTOMY ABDOMINAL, LYSIS OF ADHESIONS (N/A)  Anesthesia type: General  Patient location: PACU  Post pain: Pain level controlled  Post assessment: Post-op Vital signs reviewed  Last Vitals:  Filed Vitals:   09/01/13 1000  BP: 115/79  Pulse: 61  Temp: 36.7 C  Resp: 17    Post vital signs: Reviewed  Level of consciousness: sedated  Complications: No apparent anesthesia complications

## 2013-09-01 NOTE — Op Note (Signed)
NAMEJodie Miller NO.:  [id number]  MEDICAL RECORD NO.:  [id number]  LOCATION:  1525                         FACILITY:  [facility]  PHYSICIAN:  Dineen Kid. Rana Snare, M.D.    DATE OF BIRTH:  06/06/1976  DATE OF PROCEDURE: DATE OF DISCHARGE:                              OPERATIVE REPORT   PREOPERATIVE DIAGNOSES:  Pelvic pain, worsening now with hematuria, right ovarian mass, history of endometrioma.  POSTOPERATIVE DIAGNOSES:  Pelvic pain, worsening now with hematuria, right ovarian mass, history of endometrioma, a large tubo-ovarian abscess and pelvic adhesions.  SURGEON:  Turner Daniels, MD  ASSISTANT:  Zelphia Cairo, MD  PROCEDURE:  Diagnostic laparoscopy with conversion to laparotomy, lysis of adhesions, total abdominal hysterectomy with right salpingo- oophorectomy, left salpingectomy.  ANESTHESIA:  General endotracheal.  INDICATIONS:  Ms. Carly Miller is a 37 year old, G1, P1, who has been seen by me multiple times in the last several weeks.  Recently presented to me for evaluation of pelvic pain.  She gives a history of having an IUD inserted by another doctor.  Apparently, it had came out, they put that back, and a week later had an infection where she started having pain. They removed it and placed her on antibiotics.  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 Approximate a week later, she presented to me was doing better but the cyst measured in the 6-7 cm range was started to have discomfort and patient desires surgical intervention.  We discussed scheduling a laparoscopic evaluation of the right ovary, approximately a week later on Thanksgiving, she began having such bad pain she could not get out of bed.  It was all over the right lower quadrant shooting down to her leg. She presented to my office in a moderate amount of discomfort. Ultrasound at that time, showed a 7.5-cm cyst on the right ovary, suspicious also  for hemorrhage but also another area suspicious for an endometrioma.  She was not febrile, and thus, we decided to move up the surgery and to do this as soon as possible with hopes of preserving the right ovary.  Three to four days after this, she presented to the emergency room in the evening with frank hematuria and pain.  They did a CAT scan that day, which showed a mass coming from the right side of the uterus which is contiguous with the uterus measuring 8.2 cm in size on the right side, also suspicious for an endometrioma or cyst.  Her white count had been elevated to 15, hemoglobin 9.7, and she was sent to [facility] for further evaluation.  She was seen at [facility], was sent home on Macrobid and Pyridium, and told to follow up in my office, this was 2 days ago.  We saw her in the office.  Repeat ultrasound showed a cystic area on the right side.  At this time she was afebrile and we talked about the procedure showed her hematuria had nearly dissipated and the pain had improved somewhat.  At this time, [place] wanted to not only proceed with the surgery and evaluation but felt that if we  thought it would help her she would like to have the right tube and ovary and the uterus removed at the same time.  We discussed the procedure at length.  Discussed possibly just laparoscopic evaluation of the ovary and ovarian cystectomy with preservation of the ovary which would in my preferred method, due to the fact that she is young and has already had her left tube and ovary removed due to the endometriosis.  We discussed CO2 laser ablation of endometriosis.  We discussed the risks after removal of  the right tube and ovary she would like a hysterectomy, may or may not be able to do it at that time, and then certainly with a new symptom discussed the possibility that it could be a mass and could require operating another time or even opening her up and do a hysterectomy at that  time.  She does give her informed consent and wants [place] to be as aggressive as possible to make her pain improved and feel better.  We had  discussed risks, benefits, at length and informed consent was obtained.  FINDINGS AT THE TIME OF SURGERY:  Upon laparoscopic evaluation, there was a large mass coming from the right side of the uterus and able to tell where the uterus began, and the right ovary began, it appeared to be a large mass involving not only the ovary, but the entire right broad ligament to the pelvic sidewall, unable to visualize the ovary or ovarian tissue of fallopian tube on that side.  The liver appeared to be normal.  The appendix was retrocecal, nothing in the left adnexal area other than a small redundant fallopian tube.  At this time because of the size of the nature of this, we elected to proceed with laparotomy and in laparotomy there was a large tubo-ovarian abscess involving the entire right broad ligament, to the edge of the uterus, ovary was identified, but did have a 3-4 cm endometrioma noted.  The uterus and the tubo-ovarian complex were also adhered to the underlying bowel and epiploica.  DESCRIPTION OF PROCEDURE:  After adequate analgesia, the patient placed in the dorsal lithotomy position.  She was sterilely prepped and draped. Bladder sterilely drained.  Graves speculum was placed.  A common tenaculum was then placed and 1-cm infraumbilical skin incision was made.  A Veress needle was inserted.  The abdomen was insufflated with dullness to percussion.  An 11-mm trocar was inserted.  Laparoscope was inserted.  The above findings were noted and a 5-mm trocar was inserted to left of the midline.  After careful systematic evaluation of the abdomen and pelvis, it was deemed that because of the size of the nature of this and this would not be able to be approached the laparoscopic route.  The abdomen is then desufflated.  Trocar were removed.   The infraumbilical skin incision was closed with 0 Vicryl interrupted suture and the fascia 3-0 Vicryl repeat subcuticular suture.  The patient was converted to a laparotomy and my partner, Dr. Renaldo Fiddler was called to assist.  The Pfannenstiel skin incision was made down to the fascia, incised transversely, extended superiorly and inferiorly off the bellies of the rectus muscles were separated sharply in the midline.  The peritoneum was then entered sharply and the above findings were noted. There is no evidence of peritonitis or just a large mass involving the entire right broad ligament, contiguous with the uterus.  The left ovary was surgically absent.  The bowel was packed cephalad and  the Lenox Ahr retractor was then placed.  Kelly clamps were placed across the utero-ovarian ligaments bilaterally.  Uterus was elevated.  The base of the right round ligament was ligated with 0 Monocryl suture.  The right broad ligament was opened using Bovie cautery, and carried down to the bladder flap.  Enlarged ovary without abscess was then identified just below over the broad ligament.  The bladder was dissected off the anterior surface of the cervix, and the left uterine vasculature was dissected and clamped with Haney clamps down to the level of the uterine arteries and ligated with 0 Monocryl suture.  The left tube had previously been removed.  After opening the broad ligament on the right side noticing the large ovarian mass, a small amount of pus was extruded from this.  Anaerobic and aerobic cultures were carried out, and as the ovary decompressed we did sharply dissect this away from the bowel.  We did go ahead and clamp across the utero-ovarian ligament, and continue with hysterectomy at this time to achieve more room so that we can visualize the right ovary.  The right and left uterine vasculature was ligated and tied with 0 Monocryl suture.  The bladder was dissected off the  anterior surface of the cervix and with Heaney clamps clamped across the cardinal ligaments down to the uterosacral ligaments.  The vagina was then entered.  The cervix and uterus were removed completely and removed from the field.  The vagina was then closed with angle sutures incorporating the uterosacral ligaments and the 0 Monocryl suture.  The vagina was then closed with figure-of-eights with good approximation. Good hemostasis was achieved.  There was some induration from the right side.  Care was taken to achieve good hemostasis using Bovie cautery and also suture of 2-0 chromic.  The patient had been given indigo carmine and no evidence of bladder injury was noted, and no leaking of blue fluid that had been noted as well.  Copious amount of irrigation was applied and good hemostasis had been achieved.  Direct herself towards the right tubal ovarian abscess, we were able to identify the infundibulopelvic ligament.  We ligated this with a Heaney clamp and dissected across this down towards the bowel where we carefully elevated the small bowel and sharply dissected along the plane on the right ovary dissecting the bowel from the ovarian complex cyst.  This was elevated and removed.  We were able to remove the entire tubo-ovarian complex, which again included the ovary, the entire broad ligament and also endometrioma.  Copious amount of irrigation was carried out and reexamination revealed that it appear to be well away from the ureter. However, the entered wall did appear to be very indurated.  Good hemostasis had been achieved, except for the indurated area where we had small bleeding due to the induration and swelling from the lateral dissection, but all the pedicles appeared to be normal.  The left tube was then sharply removed using a Heaney clamp across the base of the mesosalpinx, excised, sent to pathology, and ligated with 0 Monocryl suture.  After copious amount of   irrigation, adequate hemostasis was assured of all of these areas.  We were able to identify the ureters, appeared to be free from injury, the bladder also appeared to be free from injury.  Hemostasis had been achieved.  Surgicel was then placed across the area that had been denuded by the tubo-ovarian abscess.  The packing was then removed.  The O'Connor-O'Sullivan retractor was  removed.  The peritoneum was closed with 0 Monocryl suture and viewed the rectus muscle plicated in midline.  Irrigation was applied.  After adequate hemostasis, the fascia was closed with 0 Vicryl suture in a running fashion.  Irrigation applied after adequate hemostasis.  Skin stapled.  The patient was stable on transfer to recovery room.  Sponge, needle count was normal x3.  Estimated blood loss 350 mL.  The patient received 2 g of cefotetan preoperatively.  We would admit her for admission to the [facility] to continue on IV antibiotics.     Dineen Kid Rana Snare, M.D.     DCL/MEDQ  D:  08/31/2013  T:  09/01/2013  Job:  [id number]

## 2013-09-01 NOTE — Progress Notes (Signed)
1 Day Post-Op Procedure(s) (LRB): LAPAROSCOPY DIAGNOSTIC (N/A) HYSTERECTOMY ABDOMINAL, LYSIS OF ADHESIONS (N/A)  Subjective: Patient reports tolerating PO and no problems voiding.   I discussed the findings and surgery at length with the patient last night.  She was happy to have had TAH/RSO and that she will hopefully feel better.  I discussed the recovery and the plan of care and all of there questions were answered.  Objective: I have reviewed patient's vital signs, intake and output, medications and labs.  General: alert, cooperative, appears stated age and mild distress GI: soft, non-tender; bowel sounds normal; no masses,  no organomegaly  Assessment: s/p Procedure(s): LAPAROSCOPY DIAGNOSTIC (N/A) HYSTERECTOMY ABDOMINAL, LYSIS OF ADHESIONS (N/A): stable  Plan: Adv diet Ambulate Cont IV abx due to TOA/Cultures pending Currently Afebrile Elevated WBC and Plts Recheck CBC in am  LOS: 1 day    Joe Tanney C 09/01/2013, 9:18 AM

## 2013-09-02 LAB — CBC
HCT: 28 % — ABNORMAL LOW (ref 36.0–46.0)
Hemoglobin: 9.1 g/dL — ABNORMAL LOW (ref 12.0–15.0)
MCH: 28.8 pg (ref 26.0–34.0)
MCHC: 32.5 g/dL (ref 30.0–36.0)
MCV: 88.6 fL (ref 78.0–100.0)
Platelets: 511 K/uL — ABNORMAL HIGH (ref 150–400)
RBC: 3.16 MIL/uL — ABNORMAL LOW (ref 3.87–5.11)
RDW: 13.3 % (ref 11.5–15.5)
WBC: 12.6 K/uL — ABNORMAL HIGH (ref 4.0–10.5)

## 2013-09-02 LAB — COMPREHENSIVE METABOLIC PANEL
Albumin: 2 g/dL — ABNORMAL LOW (ref 3.5–5.2)
Alkaline Phosphatase: 89 U/L (ref 39–117)
BUN: 5 mg/dL — ABNORMAL LOW (ref 6–23)
Calcium: 8.2 mg/dL — ABNORMAL LOW (ref 8.4–10.5)
GFR calc Af Amer: >90 mL/min (ref >90)
Potassium: 3.7 mEq/L (ref 3.5–5.1)
Sodium: 136 mEq/L (ref 135–145)
Total Protein: 5.8 g/dL — ABNORMAL LOW (ref 6.0–8.3)

## 2013-09-02 NOTE — Progress Notes (Signed)
2 Days Post-Op Procedure(s) (LRB): LAPAROSCOPY DIAGNOSTIC (N/A) HYSTERECTOMY ABDOMINAL, LYSIS OF ADHESIONS (N/A)  Subjective: Patient reports tolerating PO, + flatus and no problems voiding.    Objective: I have reviewed patient's vital signs, intake and output, medications, labs, microbiology and pathology.  General: alert, cooperative, appears stated age and no distress GI: soft, non-tender; bowel sounds normal; no masses,  no organomegaly and incision: clean, dry and intact  Assessment: s/p Procedure(s): LAPAROSCOPY DIAGNOSTIC (N/A) HYSTERECTOMY ABDOMINAL, LYSIS OF ADHESIONS (N/A): stable and progressing well  Plan: Encourage ambulation Discussed benign path (c/w findings) Cultures - E coli Urine Culture neg WBC decreasing Mild anemia Continue IV abx , consider d/c tomorrow  LOS: 2 days    Carly Miller C 09/02/2013, 11:14 AM

## 2013-09-03 LAB — CBC
HCT: 29.3 % — ABNORMAL LOW (ref 36.0–46.0)
Hemoglobin: 9.4 g/dL — ABNORMAL LOW (ref 12.0–15.0)
MCH: 28.4 pg (ref 26.0–34.0)
MCV: 88.5 fL (ref 78.0–100.0)
RDW: 13.4 % (ref 11.5–15.5)
WBC: 10.3 10*3/uL (ref 4.0–10.5)

## 2013-09-03 LAB — CULTURE, ROUTINE-ABSCESS

## 2013-09-03 MED ORDER — AMOXICILLIN-POT CLAVULANATE 875-125 MG PO TABS: 1.0000 | ORAL_TABLET | Freq: Two times a day (BID) | ORAL | Status: DC

## 2013-09-03 MED ORDER — IBUPROFEN 600 MG PO TABS: 600.0000 mg | ORAL_TABLET | Freq: Four times a day (QID) | ORAL | Status: DC | PRN

## 2013-09-03 MED ORDER — ESTRADIOL 2 MG PO TABS: 2.0000 mg | ORAL_TABLET | Freq: Every day | ORAL | Status: DC

## 2013-09-03 MED ORDER — OXYCODONE-ACETAMINOPHEN 5-325 MG PO TABS: 1.0000 | ORAL_TABLET | ORAL | Status: DC | PRN

## 2013-09-03 NOTE — Discharge Summary (Signed)
Physician Discharge Summary  Patient ID: Carly Miller MRN: [id number] DOB/AGE: 1976/02/25 37 y.o.  Admit date: 08/31/2013 Discharge date: 09/03/2013  Admission Diagnoses:  Discharge Diagnoses:  Active Problems:   S/P TAH (total abdominal hysterectomy)   TOA (tubo-ovarian abscess)   Endometrioma of ovary   Pelvic adhesions   S/P removal of right ovary   Discharged Condition: good  Hospital Course: Pt underwent TAH/RSO and LOA for Endometrioma and TOA noted on Laproscopy converted to laporotomy.  Her post op care was unremarkable with good return of bowel and bladder function.  She remained on IV Cefotetan for 3 days and had WBC decrease from 19 to 10 on POD #3.  She remained afebrile throughout her stay.  Cultures returned with E Coli.  Path was benign and consistent with op findings.  By POD #3 she was tolerating regular diet, afebrile, passing flatus, and pain well managed with oral meds and desired d/c home.  Consults: None  Significant Diagnostic Studies:  Treatments: IV hydration and antibiotics: cefotetan TAH/RSO, LOA  Discharge Exam: Blood pressure 92/59, pulse 81, temperature 97.9 F (36.6 C), temperature source Oral, resp. rate 18, height 5' 4.5" (1.638 m), weight 68.04 kg (150 lb), last menstrual period 08/10/2013, SpO2 97.00%. General appearance: alert, cooperative, appears stated age and no distress Cardio: regular rate and rhythm, S1, S2 normal, no murmur, click, rub or gallop Incision/Wound: Clean dry and intact.  BS+ and abdomen soft  Disposition: 01-Home or Self Care  Discharge Orders   Future Orders Complete By Expires   Apply steri strips  As directed    Scheduling Instructions:     1/4 inch strips about 1-2 cm apart   Call MD for:  difficulty breathing, headache or visual disturbances  As directed    Call MD for:  persistant nausea and vomiting  As directed    Call MD for:  redness, tenderness, or signs of infection (pain, swelling, redness, odor or  green/yellow discharge around incision site)  As directed    Call MD for:  severe uncontrolled pain  As directed    Call MD for:  temperature >100.4  As directed    Diet general  As directed    Discharge instructions  As directed    Comments:     Call for incision check in 1-2 weeks   Driving Restrictions  As directed    Comments:     No driving for 2 weeks   Increase activity slowly  As directed    Lifting restrictions  As directed    Comments:     No lifting anything greater than 10 pounds (if you have to ask, don't lift it)   Remove staples  As directed    Sexual Activity Restrictions  As directed    Comments:     Nothing in the vagina for 6 weeks       Medication List    STOP taking these medications       HYDROcodone-acetaminophen 5-325 MG per tablet  Commonly known as:  NORCO/VICODIN     nitrofurantoin (macrocrystal-monohydrate) 100 MG capsule  Commonly known as:  MACROBID     phenazopyridine 200 MG tablet  Commonly known as:  PYRIDIUM      TAKE these medications       amoxicillin-clavulanate 875-125 MG per tablet  Commonly known as:  AUGMENTIN  Take 1 tablet by mouth 2 (two) times daily.     estradiol 2 MG tablet  Commonly known as:  ESTRACE  Take 1 tablet (2 mg total) by mouth daily.     ibuprofen 600 MG tablet  Commonly known as:  ADVIL,MOTRIN  Take 1 tablet (600 mg total) by mouth every 6 (six) hours as needed (mild pain).     multivitamin capsule  Take 1 capsule by mouth daily.     oxyCODONE-acetaminophen 5-325 MG per tablet  Commonly known as:  PERCOCET/ROXICET  Take 1-2 tablets by mouth every 4 (four) hours as needed for severe pain (moderate to severe pain (when tolerating fluids)).         Signed: Zandra Lajeunesse C 09/03/2013, 11:51 AM

## 2013-09-03 NOTE — Progress Notes (Signed)
Removed incisional staples and applied steri-strips per MD order.

## 2013-09-05 LAB — ANAEROBIC CULTURE

## 2014-07-23 ENCOUNTER — Encounter (HOSPITAL_BASED_OUTPATIENT_CLINIC_OR_DEPARTMENT_OTHER): Payer: Self-pay | Admitting: Obstetrics and Gynecology

## 2017-07-23 ENCOUNTER — Emergency Department (HOSPITAL_COMMUNITY)
Admission: EM | Admit: 2017-07-23 | Discharge: 2017-07-23 | Disposition: A | Discharge status: Home / Self Care | Payer: BLUE CROSS/BLUE SHIELD | Attending: Emergency Medicine | Admitting: Emergency Medicine

## 2017-07-23 ENCOUNTER — Encounter (HOSPITAL_COMMUNITY): Payer: Self-pay

## 2017-07-23 ENCOUNTER — Emergency Department (HOSPITAL_COMMUNITY): Payer: BLUE CROSS/BLUE SHIELD

## 2017-07-23 DIAGNOSIS — R51 Headache: Secondary | ICD-10-CM | POA: Insufficient documentation

## 2017-07-23 DIAGNOSIS — Z79899 Other long term (current) drug therapy: Secondary | ICD-10-CM | POA: Diagnosis not present

## 2017-07-23 DIAGNOSIS — R519 Headache, unspecified: Secondary | ICD-10-CM

## 2017-07-23 LAB — BASIC METABOLIC PANEL
Anion gap: 8 (ref 5–15)
BUN: 11 mg/dL (ref 6–20)
CALCIUM: 8.6 mg/dL — AB (ref 8.9–10.3)
CHLORIDE: 104 mmol/L (ref 101–111)
CO2: 25 mmol/L (ref 22–32)
CREATININE: 0.87 mg/dL (ref 0.44–1.00)
GFR calc non Af Amer: >60 mL/min (ref >60)
GLUCOSE: 93 mg/dL (ref 65–99)
Potassium: 3.8 mmol/L (ref 3.5–5.1)
Sodium: 137 mmol/L (ref 135–145)

## 2017-07-23 LAB — CBC
HCT: 38.9 % (ref 36.0–46.0)
Hemoglobin: 12.6 g/dL (ref 12.0–15.0)
MCH: 30.1 pg (ref 26.0–34.0)
MCHC: 32.4 g/dL (ref 30.0–36.0)
MCV: 92.8 fL (ref 78.0–100.0)
PLATELETS: 319 10*3/uL (ref 150–400)
RBC: 4.19 MIL/uL (ref 3.87–5.11)
RDW: 12.4 % (ref 11.5–15.5)
WBC: 6.2 10*3/uL (ref 4.0–10.5)

## 2017-07-23 MED ORDER — DIPHENHYDRAMINE HCL 50 MG/ML IJ SOLN
25.0000 mg | Freq: Once | INTRAMUSCULAR | Status: AC
  Administered 2017-07-23: 25 mg via INTRAVENOUS
  Filled 2017-07-23: qty 1

## 2017-07-23 MED ORDER — PROCHLORPERAZINE EDISYLATE 5 MG/ML IJ SOLN
10.0000 mg | Freq: Once | INTRAMUSCULAR | Status: AC
  Administered 2017-07-23: 10 mg via INTRAVENOUS
  Filled 2017-07-23: qty 2

## 2017-07-23 MED ORDER — SODIUM CHLORIDE 0.9 % IV BOLUS (SEPSIS)
1000.0000 mL | Freq: Once | INTRAVENOUS | Status: AC
  Administered 2017-07-23: 1000 mL via INTRAVENOUS

## 2017-07-23 MED ORDER — DEXAMETHASONE SODIUM PHOSPHATE 10 MG/ML IJ SOLN
10.0000 mg | Freq: Once | INTRAMUSCULAR | Status: AC
  Administered 2017-07-23: 10 mg via INTRAVENOUS
  Filled 2017-07-23: qty 1

## 2017-07-23 MED ORDER — KETOROLAC TROMETHAMINE 30 MG/ML IJ SOLN
30.0000 mg | Freq: Once | INTRAMUSCULAR | Status: AC
  Administered 2017-07-23: 30 mg via INTRAVENOUS
  Filled 2017-07-23: qty 1

## 2017-07-23 NOTE — ED Triage Notes (Signed)
Left side head pain, shooting pains intermittently, taking alleve without relief

## 2017-07-23 NOTE — ED Provider Notes (Signed)
[facility] EMERGENCY DEPARTMENT Provider Note   CSN: [id number] Arrival date & time: 07/23/17  0251     History   Chief Complaint Chief Complaint  Patient presents with  . Headache    HPI Carly Miller is a 41 y.o. female.  Patient presents to the emergency department for evaluation of headache.  Patient reports that sharp and stabbing pain on the left side of her head just behind her ear that started 12 hours ago.  She took Aleve migraine at home without relief.  She does have a history of migraines, however, this headache is different than her previous migraines.  She is not experiencing any light sensitivity, nausea or vomiting.  Patient does high fever, neck pain and stiffness.  Has not had any blurred vision, numbness, tingling or weakness of the extremities.      Past Medical History:  Diagnosis Date  . Endometriosis   . Hematuria   . Mass of ovary    RIGHT  . Pelvic pain in female   . Urgency of urination   . UTI (lower urinary tract infection)     Patient Active Problem List   Diagnosis Date Noted  . S/P TAH (total abdominal hysterectomy) 08/31/2013  . TOA (tubo-ovarian abscess) 08/31/2013  . Endometrioma of ovary 08/31/2013  . Pelvic adhesions 08/31/2013  . S/P removal of right ovary 08/31/2013    Past Surgical History:  Procedure Laterality Date  . ABDOMINAL HYSTERECTOMY N/A 08/31/2013   Procedure: HYSTERECTOMY ABDOMINAL, LYSIS OF ADHESIONS;  Surgeon: Luz Lex, MD;  Location: [facility];  Service: Gynecology;  Laterality: N/A;  . ABSCESS DRAINAGE  2001   RECTAL  . ANAL SPHINCTEROTOMY  2000  . DIAGNOSTIC LAPAROSCOPY LEFT OOPHORECTOMY  08-31-2007   ENDOMETRIOMA  . LAPAROSCOPIC CHOLECYSTECTOMY  OCT 2013  . LAPAROSCOPY N/A 08/31/2013   Procedure: LAPAROSCOPY DIAGNOSTIC;  Surgeon: Luz Lex, MD;  Location: [facility];  Service: Gynecology;  Laterality: N/A;    OB History    Gravida Para Term Preterm AB Living   1  1 1  0 0 1   SAB TAB Ectopic Multiple Live Births   0 0 0 0         Home Medications    Prior to Admission medications   Medication Sig Start Date End Date Taking? Authorizing Provider  escitalopram (LEXAPRO) 10 MG tablet Take 10 mg by mouth daily.   Yes [provider]  ibuprofen (ADVIL,MOTRIN) 600 MG tablet Take 1 tablet (600 mg total) by mouth every 6 (six) hours as needed (mild pain). 09/03/13  Yes Louretta Shorten, MD  Multiple Vitamin (MULTIVITAMIN) capsule Take 1 capsule by mouth daily.   Yes [provider]  norgestimate-ethinyl estradiol (ORTHO-CYCLEN,SPRINTEC,PREVIFEM) 0.25-35 MG-MCG tablet Take 1 tablet by mouth daily.   Yes [provider]  amoxicillin-clavulanate (AUGMENTIN) 875-125 MG per tablet Take 1 tablet by mouth 2 (two) times daily. 09/03/13   Louretta Shorten, MD  estradiol (ESTRACE) 2 MG tablet Take 1 tablet (2 mg total) by mouth daily. 09/03/13   Louretta Shorten, MD  oxyCODONE-acetaminophen (PERCOCET/ROXICET) 5-325 MG per tablet Take 1-2 tablets by mouth every 4 (four) hours as needed for severe pain (moderate to severe pain (when tolerating fluids)). 09/03/13   Louretta Shorten, MD    Family History Family History  Problem Relation Age of Onset  . Asthma Mother   . Thyroid disease Mother   . Asthma Father   . Diabetes Father   . Thyroid  disease Father   . Kidney disease Sister   . Thyroid disease Maternal Aunt   . Thyroid disease Maternal Uncle   . Cancer Maternal Grandmother     Social History Social History  Substance Use Topics  . Smoking status: Never Smoker  . Smokeless tobacco: Never Used  . Alcohol use No     Allergies   Aleve [naproxen sodium]   Review of Systems Review of Systems  Neurological: Positive for headaches.  All other systems reviewed and are negative.    Physical Exam Updated Vital Signs BP 121/75 (BP Location: Left Arm)   Pulse 86   Resp 18   Ht 5\' 7"  (1.702 m)   Wt 70.3 kg (155 lb)   LMP 08/14/2013    SpO2 98%   BMI 24.28 kg/m   Physical Exam  Constitutional: She is oriented to person, place, and time. She appears well-developed and well-nourished. No distress.  HENT:  Head: Normocephalic and atraumatic.  Right Ear: Hearing normal.  Left Ear: Hearing normal.  Nose: Nose normal.  Mouth/Throat: Oropharynx is clear and moist and mucous membranes are normal.  Eyes: Pupils are equal, round, and reactive to light. Conjunctivae and EOM are normal.  Neck: Normal range of motion. Neck supple.  Cardiovascular: Regular rhythm, S1 normal and S2 normal.  Exam reveals no gallop and no friction rub.   No murmur heard. Pulmonary/Chest: Effort normal and breath sounds normal. No respiratory distress. She exhibits no tenderness.  Abdominal: Soft. Normal appearance and bowel sounds are normal. There is no hepatosplenomegaly. There is no tenderness. There is no rebound, no guarding, no tenderness at McBurney's point and negative Murphy's sign. No hernia.  Musculoskeletal: Normal range of motion.  Neurological: She is alert and oriented to person, place, and time. She has normal strength. No cranial nerve deficit or sensory deficit. Coordination normal. GCS eye subscore is 4. GCS verbal subscore is 5. GCS motor subscore is 6.  Extraocular muscle movement: normal No visual field cut Pupils: equal and reactive both direct and consensual response is normal No nystagmus present    Sensory function is intact to light touch, pinprick Proprioception intact  Grip strength 5/5 symmetric in upper extremities Normal finger to nose bilaterally  Lower extremity strength 5/5 against gravity     Skin: Skin is warm, dry and intact. No rash noted. No cyanosis.  Psychiatric: She has a normal mood and affect. Her speech is normal and behavior is normal. Thought content normal.  Nursing note and vitals reviewed.    ED Treatments / Results  Labs (all labs ordered are listed, but only abnormal results are  displayed) Labs Reviewed  BASIC METABOLIC PANEL - Abnormal; Notable for the following:       Result Value   Calcium 8.6 (*)    All other components within normal limits  CBC    EKG  EKG Interpretation None       Radiology Ct Head Wo Contrast  Result Date: 07/23/2017 CLINICAL DATA:  41 year old female with left-sided headache. EXAM: CT HEAD WITHOUT CONTRAST TECHNIQUE: Contiguous axial images were obtained from the base of the skull through the vertex without intravenous contrast. COMPARISON:  None. FINDINGS: Brain: No evidence of acute infarction, hemorrhage, hydrocephalus, extra-axial collection or mass lesion/mass effect. Vascular: No hyperdense vessel or unexpected calcification. Skull: Normal. Negative for fracture or focal lesion. Sinuses/Orbits: Mild mucoperiosteal thickening of paranasal sinuses. No air-fluid levels. The mastoid air cells are clear. Other: None IMPRESSION: No acute intracranial pathology. Electronically Signed  By: Anner Crete M.D.   On: 07/23/2017 03:49    Procedures Procedures (including critical care time)  Medications Ordered in ED Medications  sodium chloride 0.9 % bolus 1,000 mL (0 mLs Intravenous Stopped 07/23/17 0402)  ketorolac (TORADOL) 30 MG/ML injection 30 mg (30 mg Intravenous Given 07/23/17 0322)  prochlorperazine (COMPAZINE) injection 10 mg (10 mg Intravenous Given 07/23/17 0324)  dexamethasone (DECADRON) injection 10 mg (10 mg Intravenous Given 07/23/17 0326)  diphenhydrAMINE (BENADRYL) injection 25 mg (25 mg Intravenous Given 07/23/17 0320)     Initial Impression / Assessment and Plan / ED Course  I have reviewed the triage vital signs and the nursing notes.  Pertinent labs & imaging results that were available during my care of the patient were reviewed by me and considered in my medical decision making (see chart for details).     Presents to the ER for evaluation of headache.  She does have a history of recurrent migraines, but the  headache she is experiencing tonight is unusual for her.  As it is a significant change in the pattern of her headaches, other etiology was considered.  She therefore underwent CT scan and this was normal, no evidence of intracranial abnormality.  Symptoms are felt to be low risk for subarachnoid hemorrhage, I do not feel the patient requires any further workup at this time.  She is feeling improvement after migraine cocktail.  She will be discharged, follow-up with her primary doctor.  Final Clinical Impressions(s) / ED Diagnoses   Final diagnoses:  Bad headache    New Prescriptions New Prescriptions   No medications on file     Orpah Greek, MD 07/23/17 [phone]

## 2020-06-20 ENCOUNTER — Other Ambulatory Visit: Payer: Self-pay

## 2020-06-20 DIAGNOSIS — Z20822 Contact with and (suspected) exposure to covid-19: Secondary | ICD-10-CM

## 2020-06-21 LAB — SPECIMEN STATUS REPORT

## 2020-06-21 LAB — NOVEL CORONAVIRUS, NAA: SARS-CoV-2, NAA: NOT DETECTED

## 2020-06-21 LAB — SARS-COV-2, NAA 2 DAY TAT

## 2021-09-17 ENCOUNTER — Other Ambulatory Visit: Payer: Self-pay | Admitting: Nurse Practitioner

## 2021-09-17 DIAGNOSIS — N6311 Unspecified lump in the right breast, upper outer quadrant: Secondary | ICD-10-CM

## 2021-10-10 ENCOUNTER — Other Ambulatory Visit: Payer: Self-pay | Admitting: Nurse Practitioner

## 2021-10-10 ENCOUNTER — Other Ambulatory Visit: Payer: Self-pay

## 2021-10-10 ENCOUNTER — Ambulatory Visit
Admission: RE | Admit: 2021-10-10 | Discharge: 2021-10-10 | Disposition: A | Discharge status: Home / Self Care | Payer: BC Managed Care – PPO | Source: Ambulatory Visit | Attending: Nurse Practitioner | Admitting: Nurse Practitioner

## 2021-10-10 ENCOUNTER — Ambulatory Visit
Admission: RE | Admit: 2021-10-10 | Discharge: 2021-10-10 | Disposition: A | Discharge status: Home / Self Care | Payer: Self-pay | Source: Ambulatory Visit | Attending: Nurse Practitioner | Admitting: Nurse Practitioner

## 2021-10-10 DIAGNOSIS — N6311 Unspecified lump in the right breast, upper outer quadrant: Secondary | ICD-10-CM

## 2021-10-27 ENCOUNTER — Ambulatory Visit
Admission: RE | Admit: 2021-10-27 | Discharge: 2021-10-27 | Disposition: A | Discharge status: Home / Self Care | Payer: BC Managed Care – PPO | Source: Ambulatory Visit | Attending: Nurse Practitioner | Admitting: Nurse Practitioner

## 2021-10-27 DIAGNOSIS — N6311 Unspecified lump in the right breast, upper outer quadrant: Secondary | ICD-10-CM

## 2021-10-27 HISTORY — PX: BREAST BIOPSY: SHX20

## 2021-10-29 ENCOUNTER — Telehealth: Payer: Self-pay | Admitting: Hematology and Oncology

## 2021-10-29 NOTE — Telephone Encounter (Signed)
Spoke to patient to confirm morning clinic appointment for 2/15, packet will be sent via email

## 2021-10-31 ENCOUNTER — Encounter: Payer: Self-pay | Admitting: *Deleted

## 2021-10-31 DIAGNOSIS — Z170 Estrogen receptor positive status [ER+]: Secondary | ICD-10-CM | POA: Insufficient documentation

## 2021-10-31 DIAGNOSIS — C50411 Malignant neoplasm of upper-outer quadrant of right female breast: Secondary | ICD-10-CM

## 2021-11-04 NOTE — Progress Notes (Signed)
[place] CONSULT NOTE  Patient Care Team: Glenda Chroman, MD as PCP - General (Internal Medicine) Rockwell Germany, RN as Oncology Nurse Navigator Mauro Kaufmann, RN as Oncology Nurse Navigator Erroll Luna, MD as Consulting Physician (General Surgery) Benay Pike, MD as Consulting Physician (Hematology and Oncology) Gery Pray, MD as Consulting Physician (Radiation Oncology)  CHIEF COMPLAINTS/PURPOSE OF CONSULTATION:  Newly diagnosed breast cancer  HISTORY OF PRESENTING ILLNESS:  Carly Miller 46 y.o. female is here because of recent diagnosis of right breast cancer  Diagnostic mammogram 10/10/2021 Indeterminate right breast mass at the 11 o'clock position corresponding to the patient, recommend ultrasound-guided biopsy of the, no suspicious right axillary lymphadenopathy.   Targeted ultrasound is performed, showing an irregular, hypoechoic mass with associated vascularity at the 11 o'clock position 2 cm from the nipple. It measures 1.9 x 1.6 x 1.0 cm. This corresponds with the patient's palpable lump. Evaluation of the right axilla demonstrates no suspicious lymphadenopathy.  Biopsy of the area showed IDC, grade 1-2, ER positive 100% moderate staining, PR positive 100% strong staining, Her 2 neg. Ki 5%.  I reviewed her records extensively and collaborated the history with the patient.  SUMMARY OF ONCOLOGIC HISTORY: Oncology History  Malignant neoplasm of upper-outer quadrant of right breast in female, estrogen receptor positive ([place])  10/31/2021 Initial Diagnosis   Malignant neoplasm of upper-outer quadrant of right breast in female, estrogen receptor positive ([place])   11/05/2021 Cancer Staging   Staging form: Breast, AJCC 8th Edition - Clinical: Stage IA (cT1c, cN0, cM0, G2, ER+, PR+, HER2-) - Signed by Benay Pike, MD on 11/05/2021 Stage prefix: Initial diagnosis Histologic grading system: 3 grade system      MEDICAL HISTORY:  Past Medical  History:  Diagnosis Date   Breast cancer ([place])    Endometriosis    Hematuria    Hyperlipidemia    Mass of ovary    RIGHT   Pelvic pain in female    Urgency of urination    UTI (lower urinary tract infection)     SURGICAL HISTORY: Past Surgical History:  Procedure Laterality Date   ABDOMINAL HYSTERECTOMY N/A 08/31/2013   Procedure: HYSTERECTOMY ABDOMINAL, LYSIS OF ADHESIONS;  Surgeon: Luz Lex, MD;  Location: [facility];  Service: Gynecology;  Laterality: N/A;   ABSCESS DRAINAGE  09/22/1999   RECTAL   ANAL SPHINCTEROTOMY  09/21/1998   BREAST BIOPSY Right 10/27/2021   DIAGNOSTIC LAPAROSCOPY LEFT OOPHORECTOMY  08/31/2007   ENDOMETRIOMA   LAPAROSCOPIC CHOLECYSTECTOMY  06/21/2012   LAPAROSCOPY N/A 08/31/2013   Procedure: LAPAROSCOPY DIAGNOSTIC;  Surgeon: Luz Lex, MD;  Location: [facility];  Service: Gynecology;  Laterality: N/A;    SOCIAL HISTORY: Social History   Socioeconomic History   Marital status: Married    Spouse name: Not on file   Number of children: Not on file   Years of education: Not on file   Highest education level: Not on file  Occupational History   Not on file  Tobacco Use   Smoking status: Never   Smokeless tobacco: Never  Substance and Sexual Activity   Alcohol use: No   Drug use: No   Sexual activity: Not on file    Comment: nuvaring  Other Topics Concern   Not on file  Social History Narrative   Not on file   Social Determinants of Health   Financial Resource Strain: Not on file  Food Insecurity: Not on file  Transportation Needs: Not  on file  Physical Activity: Not on file  Stress: Not on file  Social Connections: Not on file  Intimate Partner Violence: Not on file    FAMILY HISTORY: Family History  Problem Relation Age of Onset   Asthma Mother    Thyroid disease Mother    Asthma Father    Diabetes Father    Thyroid disease Father    Kidney disease Sister    Thyroid disease Maternal  Aunt    Thyroid disease Maternal Uncle    Cancer Maternal Grandmother    Breast cancer Neg Hx     ALLERGIES:  is allergic to aleve [naproxen sodium].  MEDICATIONS:  Current Outpatient Medications  Medication Sig Dispense Refill   escitalopram (LEXAPRO) 10 MG tablet Take 10 mg by mouth daily.     Multiple Vitamin (MULTIVITAMIN) capsule Take 1 capsule by mouth daily.     estradiol (ESTRACE) 2 MG tablet Take 1 tablet (2 mg total) by mouth daily. (Patient not taking: Reported on 11/05/2021) 30 tablet 12   ibuprofen (ADVIL,MOTRIN) 600 MG tablet Take 1 tablet (600 mg total) by mouth every 6 (six) hours as needed (mild pain). 30 tablet 0   norgestimate-ethinyl estradiol (ORTHO-CYCLEN,SPRINTEC,PREVIFEM) 0.25-35 MG-MCG tablet Take 1 tablet by mouth daily. (Patient not taking: Reported on 11/05/2021)     oxyCODONE-acetaminophen (PERCOCET/ROXICET) 5-325 MG per tablet Take 1-2 tablets by mouth every 4 (four) hours as needed for severe pain (moderate to severe pain (when tolerating fluids)). 30 tablet 0   No current facility-administered medications for this visit.    REVIEW OF SYSTEMS:   Constitutional: Denies fevers, chills or abnormal night sweats Eyes: Denies blurriness of vision, double vision or watery eyes Ears, nose, mouth, throat, and face: Denies mucositis or sore throat Respiratory: Denies cough, dyspnea or wheezes Cardiovascular: Denies palpitation, chest discomfort or lower extremity swelling Gastrointestinal:  Denies nausea, heartburn or change in bowel habits Skin: Denies abnormal skin rashes Lymphatics: Denies new lymphadenopathy or easy bruising Neurological:Denies numbness, tingling or new weaknesses Behavioral/Psych: Mood is stable, no new changes  Breast: she complains of lump in the right breast. All other systems were reviewed with the patient and are negative.   PHYSICAL EXAMINATION:  ECOG PERFORMANCE STATUS: 0 - Asymptomatic  Vitals:   11/05/21 0854  BP: 117/68   Pulse: 81  Resp: 16  Temp: 98.7 F (37.1 C)  SpO2: 100%   Filed Weights   11/05/21 0854  Weight: 161 lb 9.6 oz (73.3 kg)    GENERAL:alert, no distress and comfortable SKIN: skin color, texture, turgor are normal, no rashes or significant lesions EYES: normal, conjunctiva are pink and non-injected, sclera clear OROPHARYNX:no exudate, no erythema and lips, buccal mucosa, and tongue normal  NECK: supple, thyroid normal size, non-tender, without nodularity LYMPH:  no palpable lymphadenopathy in the cervical, axillary or inguinal LUNGS: clear to auscultation and percussion with normal breathing effort HEART: regular rate & rhythm and no murmurs and no lower extremity edema ABDOMEN:abdomen soft, non-tender and normal bowel sounds Musculoskeletal:no cyanosis of digits and no clubbing  PSYCH: alert & oriented x 3 with fluent speech NEURO: no focal motor/sensory deficits BREAST: palpable right breast nodule in the upper inner quadrant measuring 1-2 cms. No palpable axillary or supraclavicular lymphadenopathy   LABORATORY DATA:  I have reviewed the data as listed Lab Results  Component Value Date   WBC 4.6 11/05/2021   HGB 13.1 11/05/2021   HCT 40.9 11/05/2021   MCV 93.6 11/05/2021   PLT 389 11/05/2021  Lab Results  Component Value Date   NA 139 11/05/2021   K 3.9 11/05/2021   CL 105 11/05/2021   CO2 30 11/05/2021    RADIOGRAPHIC STUDIES: I have personally reviewed the radiological reports and agreed with the findings in the report.  ASSESSMENT AND PLAN:   Malignant neoplasm of upper-outer quadrant of right breast in female, estrogen receptor positive ([place]) This is a very pleasant 46 yr old premenopausal female patient with T1cN0M0 Grade 1-2 ER PR strongly positive Her 2 negative IDC as well as grade 2 DCIS referred to [facility] for consideration of adjuvant therapy. Given small tumor and strongly ER PR positive and Her 2 neg, we discussed with upfront surgery followed by  oncotype testing. We have discussed following details about oncotype.  We have discussed about Oncotype Dx score which is a well validated prognostic scoring system which can predict outcome with endocrine therapy alone and whether chemotherapy reduces recurrence.  Typically in patients with ER positive cancers that are node negative if the RS score is high typically greater than or equal to 26, chemotherapy is recommended.  In women with intermediate recurrence score younger than 19, there can still be some role for chemotherapy in addition to endocrine therapy especially if the recurrence score is between 21-25. If chemotherapy is needed, this will precede radiation and then after radiation she will continue on antiestrogen therapy. Discussed about genetic testing, continuing lifestyle interventions such as regular exercise, healthy diet with more focus on plant-based diet, limiting alcohol intake and avoiding any hormone replacement therapy.  We have also discussed antiestrogen therapy for adjuvant recommendations. We have discussed options for antiestrogen therapy today  With regards to Tamoxifen, we discussed that this is a SERM, selective estrogen receptor modulator. We discussed mechanism of action of Tamoxifen, adverse effects on Tamoxifen including but not limited to post menopausal symptoms, increased risk of DVT/PE, increased risk of endometrial cancer, questionable cataracts with long term use and increased risk of cardiovascular events in the study which was not statistically significant. A benefit from Tamoxifen would be improvement in bone density. With regards to aromatase inhibitors, we discussed mechanism of action, adverse effects including but not limited to post menopausal symptoms, arthralgias, myalgias, increased risk of cardiovascular events and bone loss.  Tamoxifen can be used in premenopausal and post menopausal women. Aromatase inhibitors can only be used in premenopausal  women along with OFS.  She had total hysterectomy and oophorectomy but says she has ovarian remnant so we may have to use tamoxifen but will run it by her gynecologist if she can be considered for aromatase inhibitors.   She agreed to participate specimen only study from exact sciences  All questions were answered. The patient knows to call the clinic with any problems, questions or concerns.    Benay Pike, MD 11/05/21

## 2021-11-04 NOTE — Progress Notes (Signed)
Radiation Oncology         ([phone]) [phone] ________________________________  Multidisciplinary Breast Oncology Clinic [facility]) Initial Outpatient Consultation  Name: Carly Miller MRN: [id number]  Date: 11/05/2021  DOB: 1976/03/30  EO:FHQR, Costella Hatcher, MD  Erroll Luna, MD   REFERRING PHYSICIAN: Erroll Luna, MD  DIAGNOSIS: The encounter diagnosis was Malignant neoplasm of upper-outer quadrant of right breast in female, estrogen receptor positive ([place]).  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 Malignant neoplasm of upper-outer quadrant of right breast in female, estrogen receptor positive ([place])  C50.411    Z17.0       HISTORY OF PRESENT ILLNESS::Carly Miller is a 46 y.o. female who is presenting to the office today for evaluation of her newly diagnosed breast cancer. She is accompanied by her husband. She is doing well overall.   The patient presented with a palpable right breast lump. Subsequently, she underwent unilateral right diagnostic mammography with tomography and right breast ultrasonography at [facility] on 10/10/21 showing: an indeterminate right breast mass at the 11 o'clock position, corresponding with the patient's palpable lump. No suspicious right axillary lymphadenopathy was appreciated. Of note: the patient has breast implants.  Right breast biopsy at the 11 o'clock position, 2 cmfn, on 10/27/21 showed: grade 1-2 invasive ductal carcinoma measuring 0.9 cm in the greatest linear dimension, and intermediate grade ductal carcinoma in-situ. Prognostic indicators significant for: estrogen receptor, 100% positive with moderate staining intensity, and progesterone receptor, 100% positive, with strong staining intensity. Proliferation marker Ki67 at <5%. HER2 negative.  Ultrasound showed the mass to measure 1.9 cm in greatest dimension.  She reports  having her breast augmentation 7 years ago while living in the [place] area. She reports no problems with her implants.   Menarche: 55-84 years old Age at first live birth: 46 years old GP: 1 LMP: on the provided for she indicates that she had a hysterectomy and oophorectomy in December of 2014 Contraceptive: Yes, though did not indicate for how long or when. HRT: Yes, she began hormone therapy in December of 2014, she stopped taking them the last week after he cancer diagnosis (noted that her OBGYN called to tell her to stop taking them around this time).    The patient was referred today for presentation in the multidisciplinary conference.  Radiology studies and pathology slides were presented there for review and discussion of treatment options.  A consensus was discussed regarding potential next steps.  PREVIOUS RADIATION THERAPY: No  PAST MEDICAL HISTORY:  Past Medical History:  Diagnosis Date   Breast cancer ([place])    Endometriosis    Hematuria    Hyperlipidemia    Mass of ovary    RIGHT   Pelvic pain in female    Urgency of urination    UTI (lower urinary tract infection)     PAST SURGICAL HISTORY: Past Surgical History:  Procedure Laterality Date   ABDOMINAL HYSTERECTOMY N/A 08/31/2013   Procedure: HYSTERECTOMY ABDOMINAL, LYSIS OF ADHESIONS;  Surgeon: Luz Lex, MD;  Location: [facility];  Service: Gynecology;  Laterality: N/A;   ABSCESS DRAINAGE  09/22/1999   RECTAL   ANAL SPHINCTEROTOMY  09/21/1998   BREAST BIOPSY Right 10/27/2021   DIAGNOSTIC LAPAROSCOPY LEFT OOPHORECTOMY  08/31/2007   ENDOMETRIOMA   LAPAROSCOPIC CHOLECYSTECTOMY  06/21/2012   LAPAROSCOPY N/A 08/31/2013   Procedure: LAPAROSCOPY  DIAGNOSTIC;  Surgeon: Luz Lex, MD;  Location: [facility];  Service: Gynecology;  Laterality: N/A;    FAMILY HISTORY:  Family History  Problem Relation Age of Onset   Asthma Mother    Thyroid disease Mother    Asthma Father     Diabetes Father    Thyroid disease Father    Kidney disease Sister    Thyroid disease Maternal Aunt    Thyroid disease Maternal Uncle    Cancer Maternal Grandmother    Breast cancer Neg Hx     SOCIAL HISTORY:  Social History   Socioeconomic History   Marital status: Married    Spouse name: Not on file   Number of children: Not on file   Years of education: Not on file   Highest education level: Not on file  Occupational History   Not on file  Tobacco Use   Smoking status: Never   Smokeless tobacco: Never  Substance and Sexual Activity   Alcohol use: No   Drug use: No   Sexual activity: Not on file    Comment: nuvaring  Other Topics Concern   Not on file  Social History Narrative   Not on file   Social Determinants of Health   Financial Resource Strain: Low Risk    Difficulty of Paying Living Expenses: Not hard at all  Food Insecurity: No Food Insecurity   Worried About Charity fundraiser in the Last Year: Never true   [place] in the Last Year: Never true  Transportation Needs: No Transportation Needs   Lack of Transportation (Medical): No   Lack of Transportation (Non-Medical): No  Physical Activity: Not on file  Stress: Not on file  Social Connections: Not on file    ALLERGIES:  Allergies  Allergen Reactions   Aleve [Naproxen Sodium] Other (See Comments)    "Feel like I'm choking when I take it"    MEDICATIONS:  Current Outpatient Medications  Medication Sig Dispense Refill   escitalopram (LEXAPRO) 10 MG tablet Take 10 mg by mouth daily.     estradiol (ESTRACE) 2 MG tablet Take 1 tablet (2 mg total) by mouth daily. (Patient not taking: Reported on 11/05/2021) 30 tablet 12   ibuprofen (ADVIL,MOTRIN) 600 MG tablet Take 1 tablet (600 mg total) by mouth every 6 (six) hours as needed (mild pain). 30 tablet 0   Multiple Vitamin (MULTIVITAMIN) capsule Take 1 capsule by mouth daily.     norgestimate-ethinyl estradiol (ORTHO-CYCLEN,SPRINTEC,PREVIFEM)  0.25-35 MG-MCG tablet Take 1 tablet by mouth daily. (Patient not taking: Reported on 11/05/2021)     oxyCODONE-acetaminophen (PERCOCET/ROXICET) 5-325 MG per tablet Take 1-2 tablets by mouth every 4 (four) hours as needed for severe pain (moderate to severe pain (when tolerating fluids)). 30 tablet 0   No current facility-administered medications for this encounter.    REVIEW OF SYSTEMS: A 10+ POINT REVIEW OF SYSTEMS WAS OBTAINED including neurology, dermatology, psychiatry, cardiac, respiratory, lymph, extremities, GI, GU, musculoskeletal, constitutional, reproductive, HEENT. On the provided form, she reports loss of sleep, fatigue, bladder discomfort, leg cramps, wearing glasses, hearing loss, irregular heartbeat, occasional heartburn, abdominal pain/discomfort, breast lump, headaches, anxiety, and hot flashes. She denies any other symptoms.  She has stopped taking hormone therapy with her diagnosis.   PHYSICAL EXAM:    Vitals with BMI 11/05/2021  Height '5\' 7"'   Weight 161 lbs 10 oz  BMI 41.6  Systolic 384  Diastolic 68  Pulse 81   Lungs are clear  to auscultation bilaterally. Heart has regular rate and rhythm. No palpable cervical, supraclavicular, or axillary adenopathy. Abdomen soft, non-tender, normal bowel sounds. Breast: Left breast with no palpable mass, nipple discharge, or bleeding and cosmetic implant in place. Right breast with cosmetic implant in place and a small biopsy site in the lateral aspect of the breast. There is a palpable mass measuring 1.5 cm in the periareolar 11 o'clock position of the right breast. No palpable mass, nipple discharge, or bleeding.   KPS = 100  100 - Normal; no complaints; no evidence of disease. 90   - Able to carry on normal activity; minor signs or symptoms of disease. 80   - Normal activity with effort; some signs or symptoms of disease. 40   - Cares for self; unable to carry on normal activity or to do active work. 60   - Requires occasional  assistance, but is able to care for most of his personal needs. 50   - Requires considerable assistance and frequent medical care. 31   - Disabled; requires special care and assistance. 63   - Severely disabled; hospital admission is indicated although death not imminent. 29   - Very sick; hospital admission necessary; active supportive treatment necessary. 10   - Moribund; fatal processes progressing rapidly. 0     - Dead  Karnofsky DA, Abelmann [place], Craver LS and Burchenal [facility] [phone]) The use of the nitrogen mustards in the palliative treatment of carcinoma: with particular reference to bronchogenic carcinoma Cancer 1 634-56  LABORATORY DATA:  Lab Results  Component Value Date   WBC 4.6 11/05/2021   HGB 13.1 11/05/2021   HCT 40.9 11/05/2021   MCV 93.6 11/05/2021   PLT 389 11/05/2021   Lab Results  Component Value Date   NA 139 11/05/2021   K 3.9 11/05/2021   CL 105 11/05/2021   CO2 30 11/05/2021   Lab Results  Component Value Date   ALT 17 11/05/2021   AST 16 11/05/2021   ALKPHOS 42 11/05/2021   BILITOT 0.2 (L) 11/05/2021    PULMONARY FUNCTION TEST:   Recent Review Flowsheet Data   There is no flowsheet data to display.     RADIOGRAPHY: US BREAST LTD UNI RIGHT INC AXILLA  Result Date: 10/10/2021 CLINICAL DATA:  45 year old female with a palpable right breast lump. EXAM: DIGITAL DIAGNOSTIC UNILATERAL RIGHT MAMMOGRAM WITH IMPLANTS, CAD AND TOMOSYNTHESIS; ULTRASOUND RIGHT BREAST LIMITED TECHNIQUE: Right digital diagnostic mammography and breast tomosynthesis was performed. The images were evaluated with computer-aided detection. Standard and/or implant displaced views were performed.; Targeted ultrasound examination of the right breast was performed COMPARISON:  Previous exam(s). ACR Breast Density Category c: The breast tissue is heterogeneously dense, which may obscure small masses. FINDINGS: Radiopaque BB was placed at the site of the patient's lump. No focal or suspicious  mammographic findings identified deep to the radiopaque BB or within the remainder of the right breast. The patient has retropectoral implants. Targeted ultrasound is performed, showing an irregular, hypoechoic mass with associated vascularity at the 11 o'clock position 2 cm from the nipple. It measures 1.9 x 1.6 x 1.0 cm. This corresponds with the patient's palpable lump. Evaluation of the right axilla demonstrates no suspicious lymphadenopathy. IMPRESSION: 1. Indeterminate right breast mass at the 11 o'clock position corresponding with the patient's palpable lump. Recommend ultrasound-guided biopsy. 2. No suspicious right axillary lymphadenopathy. RECOMMENDATION: 1. Ultrasound-guided biopsy of the right breast. 2. If pathology demonstrates malignancy, contrast enhanced MRI is recommended given the patient's breast  density and occult mammographic findings. I have discussed the findings and recommendations with the patient. If applicable, a reminder letter will be sent to the patient regarding the next appointment. BI-RADS CATEGORY  4: Suspicious. Electronically Signed   By: Kristopher Oppenheim M.D.   On: 10/10/2021 10:38  MM DIAG BREAST W/IMPLANT TOMO UNI R  Result Date: 10/10/2021 CLINICAL DATA:  46 year old female with a palpable right breast lump. EXAM: DIGITAL DIAGNOSTIC UNILATERAL RIGHT MAMMOGRAM WITH IMPLANTS, CAD AND TOMOSYNTHESIS; ULTRASOUND RIGHT BREAST LIMITED TECHNIQUE: Right digital diagnostic mammography and breast tomosynthesis was performed. The images were evaluated with computer-aided detection. Standard and/or implant displaced views were performed.; Targeted ultrasound examination of the right breast was performed COMPARISON:  Previous exam(s). ACR Breast Density Category c: The breast tissue is heterogeneously dense, which may obscure small masses. FINDINGS: Radiopaque BB was placed at the site of the patient's lump. No focal or suspicious mammographic findings identified deep to the radiopaque  BB or within the remainder of the right breast. The patient has retropectoral implants. Targeted ultrasound is performed, showing an irregular, hypoechoic mass with associated vascularity at the 11 o'clock position 2 cm from the nipple. It measures 1.9 x 1.6 x 1.0 cm. This corresponds with the patient's palpable lump. Evaluation of the right axilla demonstrates no suspicious lymphadenopathy. IMPRESSION: 1. Indeterminate right breast mass at the 11 o'clock position corresponding with the patient's palpable lump. Recommend ultrasound-guided biopsy. 2. No suspicious right axillary lymphadenopathy. RECOMMENDATION: 1. Ultrasound-guided biopsy of the right breast. 2. If pathology demonstrates malignancy, contrast enhanced MRI is recommended given the patient's breast density and occult mammographic findings. I have discussed the findings and recommendations with the patient. If applicable, a reminder letter will be sent to the patient regarding the next appointment. BI-RADS CATEGORY  4: Suspicious. Electronically Signed   By: Kristopher Oppenheim M.D.   On: 10/10/2021 10:38  MM CLIP PLACEMENT RIGHT  Result Date: 10/27/2021 CLINICAL DATA:  Status post right breast sound guided biopsy. EXAM: 3D DIAGNOSTIC RIGHT MAMMOGRAM POST ULTRASOUND BIOPSY COMPARISON:  Previous exam(s). FINDINGS: 3D Mammographic images were obtained following ultrasound guided biopsy of the right breast. The biopsy marking clip is in expected position at the site of biopsy. IMPRESSION: Appropriate positioning of the ribbon shaped biopsy marking clip at the site of biopsy in the upper-outer right breast. Final Assessment: Post Procedure Mammograms for Marker Placement Electronically Signed   By: Kristopher Oppenheim M.D.   On: 10/27/2021 08:24  [place] RT BREAST BX W LOC DEV 1ST LESION IMG BX SPEC US GUIDE  Addendum Date: 10/28/2021   ADDENDUM REPORT: 10/28/2021 14:27 ADDENDUM: Pathology revealed GRADE I-II INVASIVE DUCTAL CARCINOMA, INTERMEDIATE GRADE DUCTAL  CARCINOMA IN SITU of the RIGHT breast, 11 o'clock, 2cmfn, (ribbon clip). This was found to be concordant by Dr. Kristopher Oppenheim. Pathology results were discussed with the patient by telephone. The patient reported doing well after the biopsy with tenderness at the site. Post biopsy instructions and care were reviewed and questions were answered. The patient was encouraged to call [facility] for any additional concerns. My direct phone number was provided. The patient was referred to [facility] on November 05, 2021. Contrast enhanced MRI is recommended given the patient's breast density and occult mammographic findings. Pathology results reported by Terie Purser, RN on 10/28/2021. Electronically Signed   By: Kristopher Oppenheim M.D.   On: 10/28/2021 14:27   Result Date: 10/28/2021 CLINICAL DATA:  46 year old female with a suspicious right breast mass. EXAM: ULTRASOUND GUIDED RIGHT BREAST CORE NEEDLE BIOPSY COMPARISON:  Previous exam(s). PROCEDURE: I met with the patient and we discussed the procedure of ultrasound-guided biopsy, including benefits and alternatives. We discussed the high likelihood of a successful procedure. We discussed the risks of the procedure, including infection, bleeding, tissue injury, clip migration, and inadequate sampling. Informed written consent was given. The usual time-out protocol was performed immediately prior to the procedure. Lesion quadrant: Upper outer quadrant Using sterile technique and 1% Lidocaine as local anesthetic, under direct ultrasound visualization, a 14 gauge spring-loaded device was used to perform biopsy of a mass at the 11 o'clock position of the right breast using a lateral approach. At the conclusion of the procedure a ribbon shaped tissue marker clip was deployed into the biopsy cavity. Follow up 2 view mammogram was performed and dictated separately. IMPRESSION:  Ultrasound guided biopsy of the right breast. No apparent complications. Electronically Signed: By: Kristopher Oppenheim M.D. 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 We discussed the general course of radiation, potential side effects, and toxicities with radiation and the patient is interested in this approach.   The patient does have retropectoral implants and we discussed potential problems with this related to RT. 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 She understands these risks and wishes to proceed with radiation therapy with implant in place.    PLAN:  Genetics  MRI Right lumpectomy and SNL w/ Oncotype testing  Adjuvant radiation therapy Aromatase inhibitor    ------------------------------------------------  Blair Promise, PhD, MD  This document serves as a record of services personally performed by Gery Pray, MD. It was created on his behalf by Roney Mans, a trained medical scribe. The creation of this record is based on the scribe's personal observations and the provider's statements to them. This document has been checked and approved by the attending provider.

## 2021-11-05 ENCOUNTER — Other Ambulatory Visit: Payer: Self-pay | Admitting: *Deleted

## 2021-11-05 ENCOUNTER — Ambulatory Visit: Payer: Self-pay | Admitting: Surgery

## 2021-11-05 ENCOUNTER — Encounter: Payer: Self-pay | Admitting: Hematology and Oncology

## 2021-11-05 ENCOUNTER — Encounter: Payer: Self-pay | Admitting: Emergency Medicine

## 2021-11-05 ENCOUNTER — Ambulatory Visit: Payer: BC Managed Care – PPO | Attending: Surgery | Admitting: Physical Therapy

## 2021-11-05 ENCOUNTER — Inpatient Hospital Stay: Payer: BC Managed Care – PPO | Admitting: Licensed Clinical Social Worker

## 2021-11-05 ENCOUNTER — Inpatient Hospital Stay (HOSPITAL_BASED_OUTPATIENT_CLINIC_OR_DEPARTMENT_OTHER): Payer: BC Managed Care – PPO | Admitting: Hematology and Oncology

## 2021-11-05 ENCOUNTER — Inpatient Hospital Stay: Payer: BC Managed Care – PPO | Attending: Hematology and Oncology

## 2021-11-05 ENCOUNTER — Inpatient Hospital Stay (HOSPITAL_BASED_OUTPATIENT_CLINIC_OR_DEPARTMENT_OTHER): Payer: BC Managed Care – PPO | Admitting: Genetic Counselor

## 2021-11-05 ENCOUNTER — Encounter: Payer: Self-pay | Admitting: Genetic Counselor

## 2021-11-05 ENCOUNTER — Encounter: Payer: Self-pay | Admitting: Physical Therapy

## 2021-11-05 ENCOUNTER — Other Ambulatory Visit: Payer: Self-pay

## 2021-11-05 ENCOUNTER — Ambulatory Visit
Admission: RE | Admit: 2021-11-05 | Discharge: 2021-11-05 | Disposition: A | Discharge status: Home / Self Care | Payer: BC Managed Care – PPO | Source: Ambulatory Visit | Attending: Radiation Oncology | Admitting: Radiation Oncology

## 2021-11-05 DIAGNOSIS — C50411 Malignant neoplasm of upper-outer quadrant of right female breast: Secondary | ICD-10-CM | POA: Insufficient documentation

## 2021-11-05 DIAGNOSIS — Z170 Estrogen receptor positive status [ER+]: Secondary | ICD-10-CM

## 2021-11-05 DIAGNOSIS — R293 Abnormal posture: Secondary | ICD-10-CM | POA: Diagnosis not present

## 2021-11-05 DIAGNOSIS — C50911 Malignant neoplasm of unspecified site of right female breast: Secondary | ICD-10-CM

## 2021-11-05 LAB — CBC WITH DIFFERENTIAL (CANCER CENTER ONLY)
Abs Immature Granulocytes: 0 10*3/uL (ref 0.00–0.07)
Basophils Absolute: 0.1 10*3/uL (ref 0.0–0.1)
Basophils Relative: 1 %
Eosinophils Absolute: 0.2 10*3/uL (ref 0.0–0.5)
Eosinophils Relative: 3 %
HCT: 40.9 % (ref 36.0–46.0)
Hemoglobin: 13.1 g/dL (ref 12.0–15.0)
Immature Granulocytes: 0 %
Lymphocytes Relative: 41 %
Lymphs Abs: 1.9 10*3/uL (ref 0.7–4.0)
MCH: 30 pg (ref 26.0–34.0)
MCHC: 32 g/dL (ref 30.0–36.0)
MCV: 93.6 fL (ref 80.0–100.0)
Monocytes Absolute: 0.4 10*3/uL (ref 0.1–1.0)
Monocytes Relative: 8 %
Neutro Abs: 2.1 10*3/uL (ref 1.7–7.7)
Neutrophils Relative %: 47 %
Platelet Count: 389 10*3/uL (ref 150–400)
RBC: 4.37 MIL/uL (ref 3.87–5.11)
RDW: 12.3 % (ref 11.5–15.5)
WBC Count: 4.6 10*3/uL (ref 4.0–10.5)
nRBC: 0 % (ref 0.0–0.2)

## 2021-11-05 LAB — CMP (CANCER CENTER ONLY)
ALT: 17 U/L (ref 0–44)
AST: 16 U/L (ref 15–41)
Albumin: 3.9 g/dL (ref 3.5–5.0)
Alkaline Phosphatase: 42 U/L (ref 38–126)
Anion gap: 4 — ABNORMAL LOW (ref 5–15)
BUN: 11 mg/dL (ref 6–20)
CO2: 30 mmol/L (ref 22–32)
Calcium: 8.9 mg/dL (ref 8.9–10.3)
Chloride: 105 mmol/L (ref 98–111)
Creatinine: 0.81 mg/dL (ref 0.44–1.00)
GFR, Estimated: >60 mL/min (ref >60)
Glucose, Bld: 75 mg/dL (ref 70–99)
Potassium: 3.9 mmol/L (ref 3.5–5.1)
Sodium: 139 mmol/L (ref 135–145)
Total Bilirubin: 0.2 mg/dL — ABNORMAL LOW (ref 0.3–1.2)
Total Protein: 6.7 g/dL (ref 6.5–8.1)

## 2021-11-05 LAB — GENETIC SCREENING ORDER

## 2021-11-05 NOTE — Progress Notes (Signed)
Page Clinical Social Work  Initial Assessment   Carly Miller is a 46 y.o. year old female accompanied by husband, Adam. Clinical Social Work was referred by [facility] for assessment of psychosocial needs.   SDOH (Social Determinants of Health) assessments performed: Yes SDOH Interventions    Flowsheet Row Most Recent Value  SDOH Interventions   Food Insecurity Interventions Intervention Not Indicated  Financial Strain Interventions Intervention Not Indicated  Housing Interventions Intervention Not Indicated  Transportation Interventions Intervention Not Indicated       Distress Screen completed: Yes ONCBCN DISTRESS SCREENING 11/05/2021  Screening Type Initial Screening  Distress experienced in past week (1-10) 4  Emotional problem type Nervousness/Anxiety  Physical Problem type Mouth sores/swallowing      Family/Social Information:  Housing Arrangement: patient lives with husband, Quita Skye Family members/support persons in your life? Family (husband, daughter) and Teacher, adult education concerns: no  Employment: Working full time as Radio broadcast assistant. Income source: Employment. Will have access to FMLA if needed and has sick & vacation time. Plans to work through tx with some adjustments (less travel than normal) Financial concerns: No Type of concern: None Food access concerns: no Services Currently in place:  n/a  Coping/ Adjustment to diagnosis: Patient understands treatment plan and what happens next? yes, feels confident in plan moving forward Concerns about diagnosis and/or treatment:  no major concerns. Has good support and an understanding workplace Patient reported stressors:  Nervousness Patient enjoys reading and walking, going to the beach Current coping skills/ strengths: Active sense of humor , Capable of independent living , Communication skills , Motivation for treatment/growth , and Supportive family/friends     SUMMARY: Current SDOH Barriers:  No  significant SDOH concerns at this time  Clinical Social Work Clinical Goal(s):  N/A  Interventions: Discussed common feeling and emotions when being diagnosed with cancer, and the importance of support during treatment Informed patient of the support team roles and support services at [facility] Provided CSW contact information and encouraged patient to call with any questions or concerns   Follow Up Plan: Patient will contact CSW with any support or resource needs Patient verbalizes understanding of plan: Yes    Christeen Douglas LCSW

## 2021-11-05 NOTE — Therapy (Signed)
OUTPATIENT PHYSICAL THERAPY BREAST CANCER BASELINE EVALUATION   Patient Name: Carly Miller MRN: [id number] DOB:30-Dec-1975, 46 y.o., female Today's Date: 11/05/2021   PT End of Session - 11/05/21 1035     Visit Number 1    Number of Visits 2    Date for PT Re-Evaluation 12/31/21    PT Start Time 1037    PT Stop Time 0962    PT Time Calculation (min) 28 min    Activity Tolerance Patient tolerated treatment well    Behavior During Therapy WFL for tasks assessed/performed             Past Medical History:  Diagnosis Date   Breast cancer ([place])    Endometriosis    Hematuria    Hyperlipidemia    Mass of ovary    RIGHT   Pelvic pain in female    Urgency of urination    UTI (lower urinary tract infection)    Past Surgical History:  Procedure Laterality Date   ABDOMINAL HYSTERECTOMY N/A 08/31/2013   Procedure: HYSTERECTOMY ABDOMINAL, LYSIS OF ADHESIONS;  Surgeon: Luz Lex, MD;  Location: [facility];  Service: Gynecology;  Laterality: N/A;   ABSCESS DRAINAGE  09/22/1999   RECTAL   ANAL SPHINCTEROTOMY  09/21/1998   BREAST BIOPSY Right 10/27/2021   DIAGNOSTIC LAPAROSCOPY LEFT OOPHORECTOMY  08/31/2007   ENDOMETRIOMA   LAPAROSCOPIC CHOLECYSTECTOMY  06/21/2012   LAPAROSCOPY N/A 08/31/2013   Procedure: LAPAROSCOPY DIAGNOSTIC;  Surgeon: Luz Lex, MD;  Location: [facility];  Service: Gynecology;  Laterality: N/A;   Patient Active Problem List   Diagnosis Date Noted   Malignant neoplasm of upper-outer quadrant of right breast in female, estrogen receptor positive ([place]) 10/31/2021   S/P TAH (total abdominal hysterectomy) 08/31/2013   TOA (tubo-ovarian abscess) 08/31/2013   Endometrioma of ovary 08/31/2013   Pelvic adhesions 08/31/2013   S/P removal of right ovary 08/31/2013    PCP: Glenda Chroman, MD  REFERRING PROVIDER: Erroll Luna, MD  REFERRING DIAG: Right breast cancer  THERAPY DIAG:  Malignant neoplasm of upper-outer  quadrant of right breast in female, estrogen receptor positive ([place]) - Plan: PT plan of care cert/re-cert  Abnormal posture - Plan: PT plan of care cert/re-cert  ONSET DATE: 8/36/6294  SUBJECTIVE                                                                                                                                                                                           SUBJECTIVE STATEMENT: Patient reports she is here today to be seen by her medical team for her newly diagnosed right breast cancer.  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 It measures 1.9 cm and is located in the upper outer quadrant. It is ER/PR positive and HER2 negative with a Ki67 of < 5%. She had breast implants placed in 2016.  PATIENT GOALS   reduce lymphedema risk and learn post op HEP.   PAIN:  Are you having pain? No  PRECAUTIONS: Active CA  HAND DOMINANCE: right  WEIGHT BEARING RESTRICTIONS No  FALLS:  Has patient fallen in last 6 months? No, Number of falls: 0  LIVING ENVIRONMENT: Patient lives with: husband and 29 y.o. daughter Lives in: House/apartment Has following equipment at home: None  OCCUPATION: Full time; Programmer, multimedia  LEISURE: She rides a bike for an hour 5x/week  PRIOR LEVEL OF FUNCTION: Independent   OBJECTIVE  COGNITION:  Overall cognitive status: Within functional limits for tasks assessed    POSTURE:  Forward head and rounded shoulders posture  UPPER EXTREMITY AROM/PROM:  A/PROM RIGHT  11/05/2021   Shoulder extension 62  Shoulder flexion 155  Shoulder abduction 157  Shoulder internal rotation 65  Shoulder external rotation 83    (Blank rows = not tested)  A/PROM LEFT  11/05/2021  Shoulder extension 47  Shoulder flexion 141  Shoulder abduction 143  Shoulder internal rotation 64  Shoulder external rotation 72    (Blank rows = not tested)   CERVICAL AROM: All  within normal limits    UPPER EXTREMITY STRENGTH: WNL   LYMPHEDEMA ASSESSMENTS:   LANDMARK RIGHT  11/05/2021  10 cm proximal to olecranon process 27.5  Olecranon process 24.5  10 cm proximal to ulnar styloid process 21.4  Just proximal to ulnar styloid process 15.7  Across hand at thumb web space 19.3  At base of 2nd digit 6.6  (Blank rows = not tested)  LANDMARK LEFT  11/05/2021  10 cm proximal to olecranon process 28.5  Olecranon process 24.9  10 cm proximal to ulnar styloid process 20.7  Just proximal to ulnar styloid process 15.2  Across hand at thumb web space 19.1  At base of 2nd digit 6.4  (Blank rows = not tested)   L-DEX LYMPHEDEMA SCREENING:  The patient was assessed using the L-Dex machine today to produce a lymphedema index baseline score. The patient will be reassessed on a regular basis (typically every 3 months) to obtain new L-Dex scores. If the score is > 6.5 points away from his/her baseline score indicating onset of subclinical lymphedema, it will be recommended to wear a compression garment for 4 weeks, 12 hours per day and then be reassessed. If the score continues to be > 6.5 points from baseline at reassessment, we will initiate lymphedema treatment. Assessing in this manner has a 95% rate of preventing clinically significant lymphedema.   L-DEX FLOWSHEETS - 11/05/21 1000       L-DEX LYMPHEDEMA SCREENING   Measurement Type Unilateral    L-DEX MEASUREMENT EXTREMITY Upper Extremity    POSITION  Standing    DOMINANT SIDE Right    At Risk Side Right    BASELINE SCORE (UNILATERAL) 1.7              QUICK DASH SURVEY:  [name] - 11/05/21 0001     Open a tight or new jar No difficulty    Do heavy household chores (wash walls, wash floors) No difficulty    Carry a shopping bag or briefcase No difficulty    Wash your  back No difficulty    Use a knife to cut food No difficulty    Recreational activities in which you take some force or impact  through your arm, shoulder, or hand (golf, hammering, tennis) No difficulty    During the past week, to what extent has your arm, shoulder or hand problem interfered with your normal social activities with family, friends, neighbors, or groups? Not at all    During the past week, to what extent has your arm, shoulder or hand problem limited your work or other regular daily activities Not at all    Arm, shoulder, or hand pain. None    Tingling (pins and needles) in your arm, shoulder, or hand None    Difficulty Sleeping No difficulty    DASH Score 0 %              PATIENT EDUCATION:  Education details: Lymphedema risk reduction and post op shoulder/posture HEP Person educated: Patient Education method: Explanation, Demonstration, Handout Education comprehension: Patient verbalized understanding and returned demonstration   HOME EXERCISE PROGRAM: Patient was instructed today in a home exercise program today for post op shoulder range of motion. These included active assist shoulder flexion in sitting, scapular retraction, wall walking with shoulder abduction, and hands behind head external rotation.  She was encouraged to do these twice a day, holding 3 seconds and repeating 5 times when permitted by her physician.   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 It measures 1.9 cm and is located in the upper outer quadrant. It is ER/PR positive and HER2 negative with a Ki67 of < 5%. She had breast implants placed in 2016.Her multidisciplinary medical team met prior to her assessments to determine a recommended treatment plan. She is planning to have a right lumpectomy and sentinel node biopsy followed by Oncotype testing, radiation, and anti-estrogen therapy. She will benefit from a post op PT reassessment to determine needs and from L-Dex screens every 3 months for 2 years to detect subclinical  lymphedema.  Pt will benefit from skilled therapeutic intervention to improve on the following deficits: Decreased knowledge of precautions, impaired UE functional use, pain, decreased ROM, postural dysfunction.   PT treatment/interventions: ADL/self-care home management, pt/family education, therapeutic exercise  REHAB POTENTIAL: Excellent  CLINICAL DECISION MAKING: Stable/uncomplicated  EVALUATION COMPLEXITY: Low   GOALS: Goals reviewed with patient? YES  LONG TERM GOALS: (STG=LTG)   Name Target Date Goal status  1 Pt will be able to verbalize understanding of pertinent lymphedema risk reduction practices relevant to her dx specifically related to skin care.  Baseline:  No knowledge 11/05/2021 Achieved at eval  2 Pt will be able to return demo and/or verbalize understanding of the post op HEP related to regaining shoulder ROM. Baseline:  No knowledge 11/05/2021 Achieved at eval  3 Pt will be able to verbalize understanding of the importance of attending the post op After Breast CA Class for further lymphedema risk reduction education and therapeutic exercise.  Baseline:  No knowledge 11/05/2021 Achieved at eval  4 Pt will demo she has regained full shoulder ROM and function post operatively compared to baselines.  Baseline: See objective measurements taken today. 12/31/2021      PLAN: PT FREQUENCY/DURATION: EVAL and 1 follow up appointment.   PLAN FOR NEXT SESSION: will reassess 3-4 weeks post op to determine needs.   Patient will follow up at outpatient cancer rehab 3-4 weeks following surgery.  If  the patient requires physical therapy at that time, a specific plan will be dictated and sent to the referring physician for approval. The patient was educated today on appropriate basic range of motion exercises to begin post operatively and the importance of attending the After Breast Cancer class following surgery.  Patient was educated today on lymphedema risk reduction practices as  it pertains to recommendations that will benefit the patient immediately following surgery.  She verbalized good understanding.    Physical Therapy Information for After Breast Cancer Surgery/Treatment:  Lymphedema is a swelling condition that you may be at risk for in your arm if you have lymph nodes removed from the armpit area.  After a sentinel node biopsy, the risk is approximately 5-9% and is higher after an axillary node dissection.  There is treatment available for this condition and it is not life-threatening.  Contact your physician or physical therapist with concerns. You may begin the 4 shoulder/posture exercises (see additional sheet) when permitted by your physician (typically a week after surgery).  If you have drains, you may need to wait until those are removed before beginning range of motion exercises.  A general recommendation is to not lift your arms above shoulder height until drains are removed.  These exercises should be done to your tolerance and gently.  This is not a "no pain/no gain" type of recovery so listen to your body and stretch into the range of motion that you can tolerate, stopping if you have pain.  If you are having immediate reconstruction, ask your plastic surgeon about doing exercises as he or she may want you to wait. We encourage you to attend the free one time ABC (After Breast Cancer) class offered by [facility].  You will learn information related to lymphedema risk, prevention and treatment and additional exercises to regain mobility following surgery.  You can call [phone] for more information.  This is offered the 1st and 3rd Monday of each month.  You only attend the class one time. While undergoing any medical procedure or treatment, try to avoid blood pressure being taken or needle sticks from occurring on the arm on the side of cancer.   This recommendation begins after surgery and continues for the rest of your life.  This  may help reduce your risk of getting lymphedema (swelling in your arm). An excellent resource for those seeking information on lymphedema is the National Lymphedema Network's web site. It can be accessed at [facility]org If you notice swelling in your hand, arm or breast at any time following surgery (even if it is many years from now), please contact your doctor or physical therapist to discuss this.  Lymphedema can be treated at any time but it is easier for you if it is treated early on.  If you feel like your shoulder motion is not returning to normal in a reasonable amount of time, please contact your surgeon or physical therapist.  [facility] [phone]. [address], [address], [place] [address]  ABC CLASS After Breast Cancer Class  After Breast Cancer Class is a specially designed exercise class to assist you in a safe recover after having breast cancer surgery.  In this class you will learn how to get back to full function whether your drains were just removed or if you had surgery a month ago.  This one-time class is held the 1st and 3rd Monday of every month from 11:00 a.m. until 12:00  noon virtually.  This class is FREE and space is limited. For more information or to register for the next available class, call [phone].  Class Goals  Understand specific stretches to improve the flexibility of you chest and shoulder. Learn ways to safely strengthen your upper body and improve your posture. Understand the warning signs of infection and why you may be at risk for an arm infection. Learn about Lymphedema and prevention.  ** You do not attend this class until after surgery.  Drains must be removed to participate  Patient was instructed today in a home exercise program today for post op shoulder range of motion. These included active assist shoulder flexion in sitting, scapular retraction, wall walking with shoulder abduction, and hands  behind head external rotation.  She was encouraged to do these twice a day, holding 3 seconds and repeating 5 times when permitted by her physician.    Quetzal Meany,MARTI COOPER, PT 11/05/2021, 11:27 AM

## 2021-11-05 NOTE — Assessment & Plan Note (Addendum)
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 Given small tumor and strongly ER PR positive and Her 2 neg, we discussed with upfront surgery followed by oncotype testing. We have discussed following details about oncotype.  We have discussed about Oncotype Dx score which is a well validated prognostic scoring system which can predict outcome with endocrine therapy alone and whether chemotherapy reduces recurrence.  Typically in patients with ER positive cancers that are node negative if the RS score is high typically greater than or equal to 26, chemotherapy is recommended.  In women with intermediate recurrence score younger than 86, there can still be some role for chemotherapy in addition to endocrine therapy especially if the recurrence score is between 21-25. If chemotherapy is needed, this will precede radiation and then after radiation she will continue on antiestrogen therapy. Discussed about genetic testing, continuing lifestyle interventions such as regular exercise, healthy diet with more focus on plant-based diet, limiting alcohol intake and avoiding any hormone replacement therapy.  We have also discussed antiestrogen therapy for adjuvant recommendations.  We have discussed options for antiestrogen therapy today  With regards to Tamoxifen, we discussed that this is a SERM, selective estrogen receptor modulator. We discussed mechanism of action of Tamoxifen, adverse effects on Tamoxifen including but not limited to post menopausal symptoms, increased risk of DVT/PE, increased risk of endometrial cancer, questionable cataracts with long term use and increased risk of cardiovascular events in the study which was not statistically significant. A benefit from Tamoxifen would be improvement in bone density. With regards to  aromatase inhibitors, we discussed mechanism of action, adverse effects including but not limited to post menopausal symptoms, arthralgias, myalgias, increased risk of cardiovascular events and bone loss.  Tamoxifen can be used in premenopausal and post menopausal women. Aromatase inhibitors can only be used in premenopausal women along with OFS.  She had total hysterectomy and oophorectomy but says she has ovarian remnant so we may have to use tamoxifen but will run it by her gynecologist if she can be considered for aromatase inhibitors.

## 2021-11-05 NOTE — Research (Signed)
[employer] 2021-05 - Specimen Collection Study to Evaluate Biomarkers in Subjects with Cancer   11/05/21  Patient Carly Miller was identified by Dr. Chryl Heck as a potential candidate for the above listed study.  This Clinical Research Coordinator met with MILIYAH LUPER, MRN [id number], on 11/05/21 in a manner and location that ensures patient privacy to discuss participation in the above listed research study.  Patient is Accompanied by her husband .  A copy of the informed consent document with embedded HIPAA language was provided to the patient.  Patient reads, speaks, and understands [place].   Patient was provided with the business card of this Coordinator and encouraged to contact the research team with any questions.  Approximately 10 minutes were spent with the patient reviewing the informed consent documents.  Patient was provided the option of taking informed consent documents home to review and was encouraged to review at their convenience with their support network, including other care providers. Patient took the consent documents home to review. Plan:  Will follow up with patient via phone to coordinate consent and blood collection if she is interested.   Clabe Seal Clinical Research Coordinator I  11/05/21 11:32 AM

## 2021-11-05 NOTE — Progress Notes (Signed)
REFERRING PROVIDER: Benay Pike, MD [place], [place] [address]  PRIMARY PROVIDER:  Glenda Chroman, MD  PRIMARY REASON FOR VISIT:  1. Malignant neoplasm of upper-outer quadrant of right breast in female, estrogen receptor positive ([place])    HISTORY OF PRESENT ILLNESS:   Carly Miller, a 46 y.o. female, was seen for a [place] cancer genetics consultation during the breast multidisciplinary clinic at the request of Dr. Chryl Heck due to a personal history of cancer.  Carly Miller presents to clinic today to discuss the possibility of a hereditary predisposition to cancer, to discuss genetic testing, and to further clarify her future cancer risks, as well as potential cancer risks for family members.   In February 2023, at the age of 60, Carly Miller was diagnosed with invasive ductal carcinoma of the right breast.  CANCER HISTORY:  Oncology History  Malignant neoplasm of upper-outer quadrant of right breast in female, estrogen receptor positive ([place])  10/31/2021 Initial Diagnosis   Malignant neoplasm of upper-outer quadrant of right breast in female, estrogen receptor positive ([place])   11/05/2021 Cancer Staging   Staging form: Breast, AJCC 8th Edition - Clinical: Stage IA (cT1c, cN0, cM0, G2, ER+, PR+, HER2-) - Signed by Benay Pike, MD on 11/05/2021 Stage prefix: Initial diagnosis Histologic grading system: 3 grade system      RISK FACTORS:  Menarche was at age 39-12.  First live birth at age 89.  OCP use: yes, unknown how long she took them Ovaries intact: no.  Uterus intact: no.  Menopausal status: postmenopausal.  HRT use: 8 years. Colonoscopy: no Mammogram within the last year: yes. Any excessive radiation exposure in the past: no  Past Medical History:  Diagnosis Date   Breast cancer ([employer])    Endometriosis    Hematuria    Hyperlipidemia    Mass of ovary    RIGHT   Pelvic pain in female    Urgency of urination    UTI (lower urinary tract infection)      Past Surgical History:  Procedure Laterality Date   ABDOMINAL HYSTERECTOMY N/A 08/31/2013   Procedure: HYSTERECTOMY ABDOMINAL, LYSIS OF ADHESIONS;  Surgeon: Luz Lex, MD;  Location: [facility];  Service: Gynecology;  Laterality: N/A;   ABSCESS DRAINAGE  09/22/1999   RECTAL   ANAL SPHINCTEROTOMY  09/21/1998   BREAST BIOPSY Right 10/27/2021   DIAGNOSTIC LAPAROSCOPY LEFT OOPHORECTOMY  08/31/2007   ENDOMETRIOMA   LAPAROSCOPIC CHOLECYSTECTOMY  06/21/2012   LAPAROSCOPY N/A 08/31/2013   Procedure: LAPAROSCOPY DIAGNOSTIC;  Surgeon: Luz Lex, MD;  Location: [facility];  Service: Gynecology;  Laterality: N/A;    Social History   Socioeconomic History   Marital status: Married    Spouse name: Not on file   Number of children: Not on file   Years of education: Not on file   Highest education level: Not on file  Occupational History   Not on file  Tobacco Use   Smoking status: Never   Smokeless tobacco: Never  Substance and Sexual Activity   Alcohol use: No   Drug use: No   Sexual activity: Not on file    Comment: nuvaring  Other Topics Concern   Not on file  Social History Narrative   Not on file   Social Determinants of Health   Financial Resource Strain: Low Risk    Difficulty of Paying Living Expenses: Not hard at all  Food Insecurity: No Food Insecurity   Worried About Running  Out of Food in the Last Year: Never true   Ran Out of Food in the Last Year: Never true  Transportation Needs: No Transportation Needs   Lack of Transportation (Medical): No   Lack of Transportation (Non-Medical): No  Physical Activity: Not on file  Stress: Not on file  Social Connections: Not on file     FAMILY HISTORY:  We obtained a detailed, 4-generation family history.  Significant diagnoses are listed below: Family History  Problem Relation Age of Onset   Asthma Mother    Thyroid disease Mother    Asthma Father    Diabetes Father    Thyroid  disease Father    Kidney disease Sister    Thyroid disease Maternal Aunt    Thyroid disease Maternal Uncle    Leukemia Maternal Grandmother        dx. 64s   Breast cancer Neg Hx      Carly Miller's only known family history of cancer is a maternal grandmother diagnosed with leukemia in her 5s, she is deceased. Of note, she has limited information about her paternal family medical history. Carly Miller is unaware of previous family history of genetic testing for hereditary cancer risks. There is no reported Ashkenazi Jewish ancestry.   GENETIC COUNSELING ASSESSMENT: Carly Miller is a 46 y.o. female with a personal and family history of cancer which is somewhat suggestive of a hereditary cancer syndrome and predisposition to cancer given her young age at diagnosis. We, therefore, discussed and recommended the following at today's visit.   DISCUSSION: We discussed that 5 - 10% of cancer is hereditary, with most cases of hereditary breast cancer associated with mutations in BRCA1/2.  There are other genes that can be associated with hereditary breast cancer syndromes. Type of cancer risk and level of risk are gene-specific. We discussed that testing is beneficial for several reasons including knowing how to follow individuals after completing their treatment, identifying whether potential treatment options would be beneficial, and understanding if other family members could be at risk for cancer and allowing them to undergo genetic testing.   We reviewed the characteristics, features and inheritance patterns of hereditary cancer syndromes. We also discussed genetic testing, including the appropriate family members to test, the process of testing, insurance coverage and turn-around-time for results. We discussed the implications of a negative, positive and/or variant of uncertain significant result. In order to get genetic test results in a timely manner so that Carly Miller can use these genetic test results for  surgical decisions, we recommended Carly Miller pursue genetic testing for the BRCAplus Panel. Once complete, we recommend Carly Miller pursue reflex genetic testing to a more comprehensive gene panel.   Carly Miller  was offered a common hereditary cancer panel (47 genes) and an expanded pan-cancer panel (77 genes). Carly Miller was informed of the benefits and limitations of each panel, including that expanded pan-cancer panels contain genes that do not have clear management guidelines at this point in time.  We also discussed that as the number of genes included on a panel increases, the chances of variants of uncertain significance increases.  After considering the benefits and limitations of each gene panel, Carly Miller elected to have [facility]'s CancerNext-Expanded Panel+RNA.   The CancerNext-Expanded gene panel offered by [facility] and includes sequencing, rearrangement, and RNA analysis for the following 77 genes: AIP, ALK, APC, ATM, AXIN2, BAP1, BARD1, BLM, BMPR1A, BRCA1, BRCA2, BRIP1, CDC73, CDH1, CDK4, CDKN1B, CDKN2A, CHEK2, CTNNA1, DICER1, FANCC, FH, FLCN, GALNT12,  KIF1B, LZTR1, MAX, MEN1, MET, MLH1, MSH2, MSH3, MSH6, MUTYH, NBN, NF1, NF2, NTHL1, PALB2, PHOX2B, PMS2, POT1, PRKAR1A, PTCH1, PTEN, RAD51C, RAD51D, RB1, RECQL, RET, SDHA, SDHAF2, SDHB, SDHC, SDHD, SMAD4, SMARCA4, SMARCB1, SMARCE1, STK11, SUFU, TMEM127, TP53, TSC1, TSC2, VHL and XRCC2 (sequencing and deletion/duplication); EGFR, EGLN1, HOXB13, KIT, MITF, PDGFRA, POLD1, and POLE (sequencing only); EPCAM and GREM1 (deletion/duplication only).   Based on Carly Miller's personal history of cancer, she meets medical criteria for genetic testing. Despite that she meets criteria, she may still have an out of pocket cost. We discussed that if her out of pocket cost for testing is over $100, the laboratory should contact them to discuss self-pay prices, patient pay assistance programs, if applicable, and other billing options.   PLAN: After considering the  risks, benefits, and limitations, Carly Miller provided informed consent to pursue genetic testing and the blood sample was sent to [facility] for analysis of the CancerNext-Expanded Panel. Results should be available within approximately 1-2 weeks' time, at which point they will be disclosed by telephone to Carly Miller, as will any additional recommendations warranted by these results. Carly Miller will receive a summary of her genetic counseling visit and a copy of her results once available. This information will also be available in Epic.   Carly Miller questions were answered to her satisfaction today. Our contact information was provided should additional questions or concerns arise. Thank you for the referral and allowing [place] to share in the care of your patient.   Carly Passy, MS, [facility] Genetic Counselor [email or web address] (P) [phone]  The patient was seen for a total of 20 minutes in face-to-face genetic counseling.  The patient brought her husband. Drs. Lindi Adie and/or Burr Medico were available to discuss this case as needed.  _______________________________________________________________________ For Office Staff:  Number of people involved in session: 2 Was an Intern/ student involved with case: no

## 2021-11-06 ENCOUNTER — Other Ambulatory Visit: Payer: Self-pay | Admitting: Surgery

## 2021-11-06 ENCOUNTER — Encounter: Payer: Self-pay | Admitting: *Deleted

## 2021-11-06 DIAGNOSIS — C50911 Malignant neoplasm of unspecified site of right female breast: Secondary | ICD-10-CM

## 2021-11-06 DIAGNOSIS — Z170 Estrogen receptor positive status [ER+]: Secondary | ICD-10-CM

## 2021-11-07 ENCOUNTER — Telehealth: Payer: Self-pay | Admitting: *Deleted

## 2021-11-07 ENCOUNTER — Encounter: Payer: Self-pay | Admitting: *Deleted

## 2021-11-07 ENCOUNTER — Telehealth: Payer: Self-pay | Admitting: Emergency Medicine

## 2021-11-07 ENCOUNTER — Telehealth: Payer: Self-pay | Admitting: Hematology and Oncology

## 2021-11-07 NOTE — Telephone Encounter (Signed)
[employer] 2021-05 - Specimen Collection Study to Evaluate Biomarkers in Subjects with Cancer   11/07/21  Called to follow up with patient regarding this study.  Patient states she would like to participate in this study.  Scheduled appointment for Friday 11/14/21 at 8:30am to sign consent, with labs to follow.  Patient denied questions regarding this study at this time.  Advised patient to call with any questions or concerns.  Clabe Seal Clinical Research Coordinator I  11/07/21 4:10 PM

## 2021-11-07 NOTE — Telephone Encounter (Signed)
Sch per 2/14 inbasket, pt aware °

## 2021-11-07 NOTE — Telephone Encounter (Signed)
Spoke to pt concerning [place] from 2.15.23. Denies questions or concerns regarding dx or treatment care plan. Encourage pt to call with needs. Received verbal understanding. Confirmed future appts with date, time and location

## 2021-11-14 ENCOUNTER — Inpatient Hospital Stay: Payer: BC Managed Care – PPO | Admitting: Emergency Medicine

## 2021-11-14 ENCOUNTER — Encounter (HOSPITAL_BASED_OUTPATIENT_CLINIC_OR_DEPARTMENT_OTHER): Payer: Self-pay | Admitting: Surgery

## 2021-11-14 ENCOUNTER — Other Ambulatory Visit: Payer: Self-pay | Admitting: *Deleted

## 2021-11-14 ENCOUNTER — Inpatient Hospital Stay: Payer: BC Managed Care – PPO

## 2021-11-14 ENCOUNTER — Ambulatory Visit
Admission: RE | Admit: 2021-11-14 | Discharge: 2021-11-14 | Disposition: A | Discharge status: Home / Self Care | Payer: BC Managed Care – PPO | Source: Ambulatory Visit | Attending: Surgery | Admitting: Surgery

## 2021-11-14 ENCOUNTER — Other Ambulatory Visit: Payer: Self-pay

## 2021-11-14 DIAGNOSIS — Z006 Encounter for examination for normal comparison and control in clinical research program: Secondary | ICD-10-CM

## 2021-11-14 DIAGNOSIS — C50411 Malignant neoplasm of upper-outer quadrant of right female breast: Secondary | ICD-10-CM

## 2021-11-14 DIAGNOSIS — Z170 Estrogen receptor positive status [ER+]: Secondary | ICD-10-CM

## 2021-11-14 LAB — RESEARCH LABS

## 2021-11-14 MED ORDER — GADOBUTROL 1 MMOL/ML IV SOLN
7.0000 mL | Freq: Once | INTRAVENOUS | Status: AC | PRN
  Administered 2021-11-14: 7 mL via INTRAVENOUS

## 2021-11-14 NOTE — Research (Signed)
[employer] 2021-05 - Specimen Collection Study to Evaluate Biomarkers in Subjects with Cancer   This Nurse has reviewed this patient's inclusion and exclusion criteria as a second review and confirms Carly Miller is eligible for study participation.  Patient may continue with enrollment.  Foye Spurling, BSN, RN, [place] Nurse II 11/14/2021

## 2021-11-14 NOTE — Research (Signed)
[employer] 2021-05 - Specimen Collection Study to Evaluate Biomarkers in Subjects with Cancer   11/14/21  Informed Consent:  Patient Carly Miller was identified by Dr. Chryl Heck as a potential candidate for the above listed study.  This Clinical Research Coordinator met with Carly Miller, MRN [id number] on 11/14/21 in a manner and location that ensures patient privacy to discuss participation in the above listed research study.  Patient is Unaccompanied.  Patient was previously provided with informed consent documents.  Patient confirmed they have read the informed consent documents.  As outlined in the informed consent form, this Coordinator and Nilda L Altschuler discussed the purpose of the research study, the investigational nature of the study, study procedures and requirements for study participation, potential risks and benefits of study participation, as well as alternatives to participation.  This study is not blinded or double-blinded. The patient understands participation is voluntary and they may withdraw from study participation at any time.  This study does not involve randomization.  This study does not involve an investigational drug or device. This study does not involve a placebo. Patient understands enrollment is pending full eligibility review.   Confidentiality and how the patient's information will be used as part of study participation were discussed.  Patient was informed there is reimbursement provided for their time and effort spent on trial participation.  The patient is encouraged to discuss research study participation with their insurance provider to determine what costs they may incur as part of study participation, including research related injury.    All questions were answered to patient's satisfaction.  The informed consent with embedded HIPAA language was reviewed page by page.  The patient's mental and emotional status is appropriate to provide informed consent, and the  patient verbalizes an understanding of study participation.  Patient has agreed to participate in the above listed research study and has voluntarily signed the informed consent version 04 Oct 2020, Revised 20 Oct 2021 with embedded HIPAA language on 11/14/21 at 08:47AM.  The patient was provided with a copy of the signed informed consent form with embedded HIPAA language for their reference.  No study specific procedures were obtained prior to the signing of the informed consent document.  Approximately 10 minutes were spent with the patient reviewing the informed consent documents.  Patient was not requested to complete a Release of Information form.  History Collection:  This patient denies past medical history of high blood pressure, coronary artery disease, lupus, rheumatoid arthritis, diabetes, or lynch syndrome.  This patient is not taking magnesium supplements. She does report taking a Flintstones multivitamin daily, but this does not contain magnesium.  The patient does report family history of cancer of leukemia in her grandmother.  She denies any other known family history of cancer in 1st or 2nd degree relatives.  The patient reports no history of alcohol consumption and no history of tobacco use.  Eligibility:  This Coordinator has reviewed this patient's inclusion and exclusion criteria and confirmed Carly Miller is eligible for study participation.  Patient will continue with enrollment.  Specimen Collection:  Blood sample was collected via venipuncture at 08:58 AM.  Gift Card:  The patient was given a $58 [employer] gift card for her participation in this study.    Patient was thanked for her time and advised to call with any questions.   Clabe Seal Clinical Research Coordinator I  11/14/21 9:06 AM

## 2021-11-17 ENCOUNTER — Encounter: Payer: Self-pay | Admitting: Genetic Counselor

## 2021-11-17 ENCOUNTER — Telehealth: Payer: Self-pay | Admitting: *Deleted

## 2021-11-17 ENCOUNTER — Other Ambulatory Visit: Payer: Self-pay | Admitting: *Deleted

## 2021-11-17 ENCOUNTER — Encounter: Payer: Self-pay | Admitting: *Deleted

## 2021-11-17 ENCOUNTER — Telehealth: Payer: Self-pay | Admitting: Genetic Counselor

## 2021-11-17 DIAGNOSIS — C50411 Malignant neoplasm of upper-outer quadrant of right female breast: Secondary | ICD-10-CM

## 2021-11-17 DIAGNOSIS — Z1379 Encounter for other screening for genetic and chromosomal anomalies: Secondary | ICD-10-CM | POA: Insufficient documentation

## 2021-11-17 DIAGNOSIS — Z170 Estrogen receptor positive status [ER+]: Secondary | ICD-10-CM

## 2021-11-17 NOTE — Telephone Encounter (Signed)
Called pt to discuss MRI results. Informed right US/bx will need to be done as small area noted. Received verbal understanding. No further needs voiced at this time.

## 2021-11-17 NOTE — Telephone Encounter (Signed)
I contacted Carly Miller to discuss her genetic testing results. No pathogenic variants were identified in the first 8 genes analyzed. Of note, we are still waiting on additional genes and will contact her when they are available. Detailed clinic note to follow.  The test report has been scanned into EPIC and is located under the Molecular Pathology section of the Results Review tab.  A portion of the result report is included below for reference.   Carly Passy, MS, [facility] Genetic Counselor [email or web address] (P) [phone]

## 2021-11-18 ENCOUNTER — Other Ambulatory Visit: Payer: Self-pay | Admitting: Surgery

## 2021-11-18 ENCOUNTER — Encounter: Payer: Self-pay | Admitting: *Deleted

## 2021-11-18 DIAGNOSIS — Z170 Estrogen receptor positive status [ER+]: Secondary | ICD-10-CM

## 2021-11-18 DIAGNOSIS — C50411 Malignant neoplasm of upper-outer quadrant of right female breast: Secondary | ICD-10-CM

## 2021-11-20 ENCOUNTER — Ambulatory Visit
Admission: RE | Admit: 2021-11-20 | Discharge: 2021-11-20 | Disposition: A | Discharge status: Home / Self Care | Payer: BC Managed Care – PPO | Source: Ambulatory Visit | Attending: Surgery | Admitting: Surgery

## 2021-11-20 DIAGNOSIS — Z170 Estrogen receptor positive status [ER+]: Secondary | ICD-10-CM

## 2021-11-20 DIAGNOSIS — C50411 Malignant neoplasm of upper-outer quadrant of right female breast: Secondary | ICD-10-CM

## 2021-11-21 ENCOUNTER — Telehealth: Payer: Self-pay | Admitting: *Deleted

## 2021-11-21 ENCOUNTER — Telehealth: Payer: Self-pay | Admitting: Genetic Counselor

## 2021-11-21 ENCOUNTER — Encounter: Payer: Self-pay | Admitting: *Deleted

## 2021-11-21 ENCOUNTER — Ambulatory Visit: Payer: Self-pay | Admitting: Genetic Counselor

## 2021-11-21 DIAGNOSIS — Z1379 Encounter for other screening for genetic and chromosomal anomalies: Secondary | ICD-10-CM

## 2021-11-21 MED ORDER — CHLORHEXIDINE GLUCONATE CLOTH 2 % EX PADS: 6.0000 | MEDICATED_PAD | Freq: Once | CUTANEOUS | Status: DC

## 2021-11-21 NOTE — Telephone Encounter (Signed)
I contacted [name] to discuss her genetic testing results. No pathogenic variants were identified in the 77 genes analyzed. Detailed clinic note to follow. ? ?The test report has been scanned into EPIC and is located under the Molecular Pathology section of the Results Review tab.  A portion of the result report is included below for reference.  ? ?Carly Passy, MS, [place] ?Genetic Counselor ?[email or web address] ?(P) [phone] ? ? ?

## 2021-11-21 NOTE — Telephone Encounter (Signed)
Spoke to pt concerning bx results, genetic results and next steps. Received verbal understanding. Discussed when final path is released nurse navigators will automatically send of oncotype testing. Gave time line for expected release date of results. ?Pt has cancer policy that needs to be filled out. Informed pt that this RN will work on it, have it signed and returned to pt by 3/6 or 3/7 ?

## 2021-11-21 NOTE — Progress Notes (Signed)
HPI:   ?Carly Miller was previously seen in the [place] clinic due to a personal history of cancer and concerns regarding a hereditary predisposition to cancer. Please refer to our prior cancer genetics clinic note for more information regarding our discussion, assessment and recommendations, at the time. Carly Miller recent genetic test results were disclosed to Carly, as were recommendations warranted by these results. These results and recommendations are discussed in more detail below. ? ?CANCER HISTORY:  ?Oncology History  ?Malignant neoplasm of upper-outer quadrant of right breast in female, estrogen receptor positive ([place])  ?10/31/2021 Initial Diagnosis  ? Malignant neoplasm of upper-outer quadrant of right breast in female, estrogen receptor positive ([place]) ?  ?11/05/2021 Cancer Staging  ? Staging form: Breast, AJCC 8th Edition ?- Clinical: Stage IA (cT1c, cN0, cM0, G2, ER+, PR+, HER2-) - Signed by Benay Pike, MD on 11/05/2021 ?Stage prefix: Initial diagnosis ?Histologic grading system: 3 grade system ? ?  ? Genetic Testing  ? [facility] CancerNext-Expanded Panel was Negative. Report date is 11/17/2021. ? ?The CancerNext-Expanded gene panel offered by [facility] and includes sequencing, rearrangement, and RNA analysis for the following 77 genes: AIP, ALK, APC, ATM, AXIN2, BAP1, BARD1, BLM, BMPR1A, BRCA1, BRCA2, BRIP1, CDC73, CDH1, CDK4, CDKN1B, CDKN2A, CHEK2, CTNNA1, DICER1, FANCC, FH, FLCN, GALNT12, KIF1B, LZTR1, MAX, MEN1, MET, MLH1, MSH2, MSH3, MSH6, MUTYH, NBN, NF1, NF2, NTHL1, PALB2, PHOX2B, PMS2, POT1, PRKAR1A, PTCH1, PTEN, RAD51C, RAD51D, RB1, RECQL, RET, SDHA, SDHAF2, SDHB, SDHC, SDHD, SMAD4, SMARCA4, SMARCB1, SMARCE1, STK11, SUFU, TMEM127, TP53, TSC1, TSC2, VHL and XRCC2 (sequencing and deletion/duplication); EGFR, EGLN1, HOXB13, KIT, MITF, PDGFRA, POLD1, and POLE (sequencing only); EPCAM and GREM1 (deletion/duplication only).  ?  ? ? ?FAMILY HISTORY:  ?We obtained a detailed,  4-generation family history.  Significant diagnoses are listed below: ?     ?Family History  ?Problem Relation Age of Onset  ? Asthma Mother    ? Thyroid disease Mother    ? Asthma Father    ? Diabetes Father    ? Thyroid disease Father    ? Kidney disease Sister    ? Thyroid disease Maternal Aunt    ? Thyroid disease Maternal Uncle    ? Leukemia Maternal Grandmother    ?      dx. 40s  ? Breast cancer Neg Hx    ?  ?  ? ?Carly Miller's only known family history of cancer is a maternal grandmother diagnosed with leukemia in Carly 75s, she is deceased. Of note, she has limited information about Carly paternal family medical history. Carly Miller is unaware of previous family history of genetic testing for hereditary cancer risks. There is no reported Ashkenazi Jewish ancestry.  ? ?GENETIC TEST RESULTS:  ?The [facility] CancerNext-Expanded Panel found no pathogenic mutations.  ? ?The CancerNext-Expanded gene panel offered by [facility] and includes sequencing, rearrangement, and RNA analysis for the following 77 genes: AIP, ALK, APC, ATM, AXIN2, BAP1, BARD1, BLM, BMPR1A, BRCA1, BRCA2, BRIP1, CDC73, CDH1, CDK4, CDKN1B, CDKN2A, CHEK2, CTNNA1, DICER1, FANCC, FH, FLCN, GALNT12, KIF1B, LZTR1, MAX, MEN1, MET, MLH1, MSH2, MSH3, MSH6, MUTYH, NBN, NF1, NF2, NTHL1, PALB2, PHOX2B, PMS2, POT1, PRKAR1A, PTCH1, PTEN, RAD51C, RAD51D, RB1, RECQL, RET, SDHA, SDHAF2, SDHB, SDHC, SDHD, SMAD4, SMARCA4, SMARCB1, SMARCE1, STK11, SUFU, TMEM127, TP53, TSC1, TSC2, VHL and XRCC2 (sequencing and deletion/duplication); EGFR, EGLN1, HOXB13, KIT, MITF, PDGFRA, POLD1, and POLE (sequencing only); EPCAM and GREM1 (deletion/duplication only).  ? ?The test report has been scanned into EPIC and is located under the Molecular  Pathology section of the Results Review tab.  A portion of the result report is included below for reference. Genetic testing reported out on 11/17/2021.  ? ? ? ? ? ? ? ?Even though a pathogenic variant was not identified, possible explanations  for Carly personal history of cancer may include: ?There may be no hereditary risk for cancer in the family. Carly Miller personal history of cancer may be due to other genetic or environmental factors. ?There may be a gene mutation in one of these genes that current testing methods cannot detect, but that chance is small. ?There could be another gene that has not yet been discovered, or that we have not yet tested, that is responsible for the cancer diagnoses in the family.  ? ?Therefore, it is important to remain in touch with cancer genetics in the future so that we can continue to offer Carly Miller the most up to date genetic testing.  ? ?ADDITIONAL GENETIC TESTING:  ?We discussed with Carly Miller that Carly genetic testing was fairly extensive.  If there are genes identified to increase cancer risk that can be analyzed in the future, we would be happy to discuss and coordinate this testing at that time.   ? ?CANCER SCREENING RECOMMENDATIONS:  ?Carly Miller's test result is considered negative (normal).  This means that we have not identified a hereditary cause for Carly personal history of cancer at this time. Most cancers happen by chance and this negative test suggests that Carly cancer may fall into this category.   ? ?An individual's cancer risk and medical management are not determined by genetic test results alone. Overall cancer risk assessment incorporates additional factors, including personal medical history, family history, and any available genetic information that may result in a personalized plan for cancer prevention and surveillance. Therefore, it is recommended she continue to follow the cancer management and screening guidelines provided by Carly oncology and primary healthcare provider. ? ?RECOMMENDATIONS FOR FAMILY MEMBERS:   ?Since she did not inherit a mutation in a cancer predisposition gene included on this panel, Carly Miller could not have inherited a mutation from Carly in one of these  genes. ?Individuals in this family might be at some increased risk of developing cancer, over the general population risk, due to the family history of cancer. We recommend Carly Miller begin mammograms at age 85 (93 years prior to Carly Miller's age at diagnosis).  ? ?FOLLOW-UP:  ?Cancer genetics is a rapidly advancing field and it is possible that new genetic tests will be appropriate for Carly and/or Carly family members in the future. We encouraged Carly to remain in contact with cancer genetics on an annual basis so we can update Carly personal and family histories and let Carly know of advances in cancer genetics that may benefit this family.  ? ?Our contact number was provided. Ms. Venning questions were answered to Carly satisfaction, and she knows she is welcome to call us at anytime with additional questions or concerns.  ? ?Lucille Passy, MS, [place] ?Genetic Counselor ?[email or web address] ?(P) [phone] ? ? ?

## 2021-11-21 NOTE — Progress Notes (Signed)
? ? ? ? ?  Enhanced Recovery after Surgery for Orthopedics ?Enhanced Recovery after Surgery is a protocol used to improve the stress on your body and your recovery after surgery. ? ?Patient Instructions ? ?The night before surgery:  ?No food after midnight. ONLY clear liquids after midnight ? ?The day of surgery (if you do NOT have diabetes):  ?Drink ONE (1) Pre-Surgery Clear Ensure as directed.   ?This drink was given to you during your hospital  ?pre-op appointment visit. ?The pre-op nurse will instruct you on the time to drink the  ?Pre-Surgery Ensure depending on your surgery time. ?Finish the drink at the designated time by the pre-op nurse.  ?Nothing else to drink after completing the  ?Pre-Surgery Clear Ensure. ? ?The day of surgery (if you have diabetes): ?Drink ONE (1) Gatorade 2 (G2) as directed. ?This drink was given to you during your hospital  ?pre-op appointment visit.  ?The pre-op nurse will instruct you on the time to drink the  ? Gatorade 2 (G2) depending on your surgery time. ?Color of the Gatorade may vary. Red is not allowed. ?Nothing else to drink after completing the  ?Gatorade 2 (G2). ? ?       If you have questions, please contact your surgeon?s office. ? ? ?Patient received surgical soap with instructions and verbalized understanding. ?

## 2021-11-24 ENCOUNTER — Ambulatory Visit
Admission: RE | Admit: 2021-11-24 | Discharge: 2021-11-24 | Disposition: A | Discharge status: Home / Self Care | Payer: BC Managed Care – PPO | Source: Ambulatory Visit | Attending: Surgery | Admitting: Surgery

## 2021-11-24 DIAGNOSIS — C50911 Malignant neoplasm of unspecified site of right female breast: Secondary | ICD-10-CM

## 2021-11-25 ENCOUNTER — Ambulatory Visit (HOSPITAL_BASED_OUTPATIENT_CLINIC_OR_DEPARTMENT_OTHER): Payer: BC Managed Care – PPO | Admitting: Anesthesiology

## 2021-11-25 ENCOUNTER — Ambulatory Visit (HOSPITAL_BASED_OUTPATIENT_CLINIC_OR_DEPARTMENT_OTHER)
Admission: RE | Admit: 2021-11-25 | Discharge: 2021-11-25 | Disposition: A | Discharge status: Home / Self Care | Payer: BC Managed Care – PPO | Attending: Surgery | Admitting: Surgery

## 2021-11-25 ENCOUNTER — Other Ambulatory Visit: Payer: Self-pay

## 2021-11-25 ENCOUNTER — Ambulatory Visit
Admission: RE | Admit: 2021-11-25 | Discharge: 2021-11-25 | Disposition: A | Discharge status: Home / Self Care | Payer: BC Managed Care – PPO | Source: Ambulatory Visit | Attending: Surgery | Admitting: Surgery

## 2021-11-25 ENCOUNTER — Encounter (HOSPITAL_BASED_OUTPATIENT_CLINIC_OR_DEPARTMENT_OTHER): Payer: Self-pay | Admitting: Surgery

## 2021-11-25 ENCOUNTER — Encounter (HOSPITAL_BASED_OUTPATIENT_CLINIC_OR_DEPARTMENT_OTHER)
Admission: RE | Disposition: A | Discharge status: Home / Self Care | Payer: Self-pay | Source: Home / Self Care | Attending: Surgery

## 2021-11-25 DIAGNOSIS — Z170 Estrogen receptor positive status [ER+]: Secondary | ICD-10-CM | POA: Insufficient documentation

## 2021-11-25 DIAGNOSIS — C50411 Malignant neoplasm of upper-outer quadrant of right female breast: Secondary | ICD-10-CM | POA: Diagnosis present

## 2021-11-25 DIAGNOSIS — C50911 Malignant neoplasm of unspecified site of right female breast: Secondary | ICD-10-CM

## 2021-11-25 HISTORY — PX: BREAST LUMPECTOMY WITH RADIOACTIVE SEED AND SENTINEL LYMPH NODE BIOPSY: SHX6550

## 2021-11-25 SURGERY — BREAST LUMPECTOMY WITH RADIOACTIVE SEED AND SENTINEL LYMPH NODE BIOPSY
Anesthesia: General | Site: Breast | Laterality: Right

## 2021-11-25 MED ORDER — SODIUM CHLORIDE 0.9 % IV SOLN
INTRAVENOUS | Status: AC
  Filled 2021-11-25: qty 10

## 2021-11-25 MED ORDER — OXYCODONE HCL 5 MG PO TABS: 5.0000 mg | ORAL_TABLET | Freq: Four times a day (QID) | ORAL | 0 refills | Status: DC | PRN

## 2021-11-25 MED ORDER — OXYCODONE HCL 5 MG PO TABS: 5.0000 mg | ORAL_TABLET | Freq: Once | ORAL | Status: DC | PRN

## 2021-11-25 MED ORDER — LACTATED RINGERS IV SOLN: INTRAVENOUS | Status: DC

## 2021-11-25 MED ORDER — CEFAZOLIN SODIUM-DEXTROSE 2-4 GM/100ML-% IV SOLN
2.0000 g | INTRAVENOUS | Status: AC
  Administered 2021-11-25: 2 g via INTRAVENOUS

## 2021-11-25 MED ORDER — ONDANSETRON HCL 4 MG/2ML IJ SOLN
INTRAMUSCULAR | Status: DC | PRN
  Administered 2021-11-25: 4 mg via INTRAVENOUS

## 2021-11-25 MED ORDER — PROPOFOL 10 MG/ML IV BOLUS
INTRAVENOUS | Status: DC | PRN
  Administered 2021-11-25: 200 mg via INTRAVENOUS

## 2021-11-25 MED ORDER — EPHEDRINE SULFATE (PRESSORS) 50 MG/ML IJ SOLN
INTRAMUSCULAR | Status: DC | PRN
  Administered 2021-11-25: 5 mg via INTRAVENOUS
  Administered 2021-11-25: 10 mg via INTRAVENOUS

## 2021-11-25 MED ORDER — FENTANYL CITRATE (PF) 100 MCG/2ML IJ SOLN
INTRAMUSCULAR | Status: AC
  Filled 2021-11-25: qty 2

## 2021-11-25 MED ORDER — PROPOFOL 10 MG/ML IV BOLUS
INTRAVENOUS | Status: AC
  Filled 2021-11-25: qty 20

## 2021-11-25 MED ORDER — MIDAZOLAM HCL 2 MG/2ML IJ SOLN
INTRAMUSCULAR | Status: AC
  Filled 2021-11-25: qty 2

## 2021-11-25 MED ORDER — BUPIVACAINE LIPOSOME 1.3 % IJ SUSP
INTRAMUSCULAR | Status: DC | PRN
  Administered 2021-11-25: 10 mL

## 2021-11-25 MED ORDER — BUPIVACAINE-EPINEPHRINE (PF) 0.25% -1:200000 IJ SOLN
INTRAMUSCULAR | Status: DC | PRN
  Administered 2021-11-25: 16 mL

## 2021-11-25 MED ORDER — PHENYLEPHRINE HCL (PRESSORS) 10 MG/ML IV SOLN
INTRAVENOUS | Status: DC | PRN
  Administered 2021-11-25 (×5): 80 ug via INTRAVENOUS

## 2021-11-25 MED ORDER — BUPIVACAINE HCL (PF) 0.5 % IJ SOLN
INTRAMUSCULAR | Status: DC | PRN
  Administered 2021-11-25: 15 mL

## 2021-11-25 MED ORDER — FENTANYL CITRATE (PF) 100 MCG/2ML IJ SOLN
100.0000 ug | Freq: Once | INTRAMUSCULAR | Status: AC
  Administered 2021-11-25: 50 ug via INTRAVENOUS

## 2021-11-25 MED ORDER — MAGTRACE LYMPHATIC TRACER
INTRAMUSCULAR | Status: DC | PRN
  Administered 2021-11-25: 2 mL via INTRAMUSCULAR

## 2021-11-25 MED ORDER — PHENYLEPHRINE 40 MCG/ML (10ML) SYRINGE FOR IV PUSH (FOR BLOOD PRESSURE SUPPORT)
PREFILLED_SYRINGE | INTRAVENOUS | Status: AC
  Filled 2021-11-25: qty 10

## 2021-11-25 MED ORDER — ONDANSETRON HCL 4 MG/2ML IJ SOLN
INTRAMUSCULAR | Status: AC
  Filled 2021-11-25: qty 2

## 2021-11-25 MED ORDER — FENTANYL CITRATE (PF) 100 MCG/2ML IJ SOLN: 25.0000 ug | INTRAMUSCULAR | Status: DC | PRN

## 2021-11-25 MED ORDER — CEFAZOLIN SODIUM-DEXTROSE 2-4 GM/100ML-% IV SOLN
INTRAVENOUS | Status: AC
  Filled 2021-11-25: qty 100

## 2021-11-25 MED ORDER — MIDAZOLAM HCL 2 MG/2ML IJ SOLN
2.0000 mg | Freq: Once | INTRAMUSCULAR | Status: AC
  Administered 2021-11-25: 2 mg via INTRAVENOUS

## 2021-11-25 MED ORDER — SODIUM CHLORIDE 0.9 % IV SOLN
INTRAVENOUS | Status: DC | PRN
  Administered 2021-11-25: 500 mL

## 2021-11-25 MED ORDER — EPHEDRINE 5 MG/ML INJ
INTRAVENOUS | Status: AC
  Filled 2021-11-25: qty 5

## 2021-11-25 MED ORDER — OXYCODONE HCL 5 MG/5ML PO SOLN: 5.0000 mg | Freq: Once | ORAL | Status: DC | PRN

## 2021-11-25 MED ORDER — ACETAMINOPHEN 500 MG PO TABS: 1000.0000 mg | ORAL_TABLET | ORAL | Status: AC

## 2021-11-25 MED ORDER — ACETAMINOPHEN 500 MG PO TABS
1000.0000 mg | ORAL_TABLET | Freq: Once | ORAL | Status: AC
  Administered 2021-11-25: 1000 mg via ORAL

## 2021-11-25 MED ORDER — FENTANYL CITRATE (PF) 100 MCG/2ML IJ SOLN
INTRAMUSCULAR | Status: DC | PRN
  Administered 2021-11-25: 25 ug via INTRAVENOUS
  Administered 2021-11-25: 50 ug via INTRAVENOUS
  Administered 2021-11-25: 25 ug via INTRAVENOUS

## 2021-11-25 MED ORDER — PROCHLORPERAZINE EDISYLATE 10 MG/2ML IJ SOLN: 10.0000 mg | Freq: Once | INTRAMUSCULAR | Status: DC | PRN

## 2021-11-25 MED ORDER — DEXAMETHASONE SODIUM PHOSPHATE 10 MG/ML IJ SOLN
INTRAMUSCULAR | Status: DC | PRN
  Administered 2021-11-25: 4 mg via INTRAVENOUS

## 2021-11-25 MED ORDER — LIDOCAINE HCL (CARDIAC) PF 100 MG/5ML IV SOSY
PREFILLED_SYRINGE | INTRAVENOUS | Status: DC | PRN
  Administered 2021-11-25: 40 mg via INTRATRACHEAL

## 2021-11-25 MED ORDER — ACETAMINOPHEN 500 MG PO TABS
ORAL_TABLET | ORAL | Status: AC
  Filled 2021-11-25: qty 2

## 2021-11-25 SURGICAL SUPPLY — 45 items
ADH SKN CLS APL DERMABOND .7 (GAUZE/BANDAGES/DRESSINGS) ×1
APL PRP STRL LF DISP 70% ISPRP (MISCELLANEOUS) ×1
APPLIER CLIP 9.375 MED OPEN (MISCELLANEOUS) ×3
APR CLP MED 9.3 20 MLT OPN (MISCELLANEOUS) ×1
BLADE SURG 15 STRL LF DISP TIS (BLADE) ×2 IMPLANT
BLADE SURG 15 STRL SS (BLADE) ×3
CANISTER SUCT 1200ML W/VALVE (MISCELLANEOUS) ×4 IMPLANT
CHLORAPREP W/TINT 26 (MISCELLANEOUS) ×4 IMPLANT
CLIP APPLIE 9.375 MED OPEN (MISCELLANEOUS) ×2 IMPLANT
COVER BACK TABLE 60X90IN (DRAPES) ×4 IMPLANT
COVER MAYO STAND STRL (DRAPES) ×4 IMPLANT
COVER PROBE W GEL 5X96 (DRAPES) ×4 IMPLANT
DERMABOND ADVANCED (GAUZE/BANDAGES/DRESSINGS) ×2
DERMABOND ADVANCED .7 DNX12 (GAUZE/BANDAGES/DRESSINGS) ×2 IMPLANT
DRAPE LAPAROSCOPIC ABDOMINAL (DRAPES) ×4 IMPLANT
DRAPE UTILITY XL STRL (DRAPES) ×4 IMPLANT
ELECT COATED BLADE 2.86 ST (ELECTRODE) ×4 IMPLANT
ELECT REM PT RETURN 9FT ADLT (ELECTROSURGICAL) ×3
ELECTRODE REM PT RTRN 9FT ADLT (ELECTROSURGICAL) ×2 IMPLANT
GLOVE SRG 8 PF TXTR STRL LF DI (GLOVE) ×2 IMPLANT
GLOVE SURG LTX SZ8 (GLOVE) ×4 IMPLANT
GLOVE SURG POLYISO LF SZ7 (GLOVE) ×4 IMPLANT
GLOVE SURG UNDER POLY LF SZ8 (GLOVE) ×3
GOWN STRL REUS W/ TWL LRG LVL3 (GOWN DISPOSABLE) ×4 IMPLANT
GOWN STRL REUS W/ TWL XL LVL3 (GOWN DISPOSABLE) ×2 IMPLANT
GOWN STRL REUS W/TWL LRG LVL3 (GOWN DISPOSABLE) ×6
GOWN STRL REUS W/TWL XL LVL3 (GOWN DISPOSABLE) ×3
HEMOSTAT ARISTA ABSORB 3G PWDR (HEMOSTASIS) ×2 IMPLANT
KIT MARKER MARGIN INK (KITS) ×4 IMPLANT
NDL HYPO 25X1 1.5 SAFETY (NEEDLE) ×2 IMPLANT
NEEDLE HYPO 25X1 1.5 SAFETY (NEEDLE) ×3 IMPLANT
NS IRRIG 1000ML POUR BTL (IV SOLUTION) ×4 IMPLANT
PACK BASIN DAY SURGERY FS (CUSTOM PROCEDURE TRAY) ×4 IMPLANT
PENCIL SMOKE EVACUATOR (MISCELLANEOUS) ×4 IMPLANT
SLEEVE SCD COMPRESS KNEE MED (STOCKING) ×4 IMPLANT
SPONGE T-LAP 4X18 ~~LOC~~+RFID (SPONGE) ×6 IMPLANT
SUT MNCRL AB 4-0 PS2 18 (SUTURE) ×4 IMPLANT
SUT VICRYL 3-0 CR8 SH (SUTURE) ×4 IMPLANT
SYR CONTROL 10ML LL (SYRINGE) ×4 IMPLANT
TOWEL GREEN STERILE FF (TOWEL DISPOSABLE) ×4 IMPLANT
TRACER MAGTRACE VIAL (MISCELLANEOUS) ×2 IMPLANT
TRAY FAXITRON CT DISP (TRAY / TRAY PROCEDURE) ×4 IMPLANT
TUBE CONNECTING 20'X1/4 (TUBING) ×1
TUBE CONNECTING 20X1/4 (TUBING) ×3 IMPLANT
YANKAUER SUCT BULB TIP NO VENT (SUCTIONS) ×4 IMPLANT

## 2021-11-25 NOTE — Anesthesia Procedure Notes (Signed)
Anesthesia Regional Block: Pectoralis block  ? ?Pre-Anesthetic Checklist: , timeout performed,  Correct Patient, Correct Site, Correct Laterality,  Correct Procedure, Correct Position, site marked,  Risks and benefits discussed,  Surgical consent,  Pre-op evaluation,  At surgeon's request and post-op pain management ? ?Laterality: Right ? ?Prep: chloraprep     ?  ?Needles:  ?Injection technique: Single-shot ? ?Needle Type: Echogenic Needle   ? ? ?Needle Length: 10cm  ?Needle Gauge: 21  ? ? ? ?Additional Needles: ? ? ?Narrative:  ?Start time: 11/25/2021 8:40 AM ?End time: 11/25/2021 8:44 AM ?Injection made incrementally with aspirations every 5 mL. ? ?Performed by: Personally  ?Anesthesiologist: Audry Pili, MD ? ?Additional Notes: ?No pain on injection. No increased resistance to injection. Injection made in 5cc increments. Good needle visualization. Patient tolerated the procedure well. ? ? ? ? ?

## 2021-11-25 NOTE — Op Note (Signed)
Preoperative diagnosis: Stage I right breast cancer upper outer quadrant ER positive ? ?Postoperative diagnosis: Same ? ?Procedure: Right breast seed localized lumpectomy with right axillary sentinel lymph node mapping utilizing mag trace ? ?Surgeon: Erroll Luna, MD ? ?Assistant: Albin Felling MD ? ?I was personally present during the key and critical portions of this procedure and immediately available throughout the entire procedure, as documented in my operative note.  ? ?Anesthesia LMA with l right pectoral block and 0.25% Marcaine plain ? ?EBL: Minimal ? ?Specimen: Right breast tissue with seed and clip verified by Faxitron.  Of note the seed was sent separate.  Also additional margins around the cavity were all excised.  Grossly margins were normal.  2 right axillary sentinel nodes ? ?Drains: None ? ?Indications for procedure: The patient is a 46 year old female found to have stage I right breast cancer.  She was seen in the multidisciplinary clinic.  Breast conserving options were discussed as well as mastectomy with reconstruction.  Of note she had retropectoral implants that were about 44 years old.  She wished to preserve her breast and opted for breast conserving surgery.  We discussed the pros and cons of this.  We discussed local regional recurrence rates as well as long-term survival statistics comparing it to mastectomy with reconstruction.  We discussed potential damage to her pre-existing implants during surgery which would require removal and replacement.  We discussed lymphedema and complications associated with lymph node surgery to include pain, shoulder stiffness, lymphedema and injury to major nerve and/or vascular structures.  After discussion of the above she wished to proceed with breast conserving surgery for her stage I right breast cancer.The procedure has been discussed with the patient. Alternatives to surgery have been discussed with the patient.  Risks of surgery include  bleeding,  Infection,  Seroma formation, death,  and the need for further surgery.   The patient understands and wishes to proceed. Sentinel lymph node mapping and dissection has been discussed with the patient.  Risk of bleeding,  Infection,  Seroma formation,  Additional procedures,,  Shoulder weakness ,  Shoulder stiffness,  Nerve and blood vessel injury and reaction to the mapping dyes have been discussed.  Alternatives to surgery have been discussed with the patient.  The patient agrees to proceed.  ? ? ? ?Description of procedure: The patient was met in the holding area.  Questions were answered.  Her seed was placed yesterday at the [facility].  Films were available for review.  The right side was marked as the correct site and all questions were answered.  A pectoral block was placed by anesthesia.  She was then taken back to the operative room.  She is placed supine on the OR table.  After induction of general esthesia, right breast was prepped with alcohol and 2 cc of mag trace were injected under the right nipple areolar complex.  Massage was then done for 5 minutes.  The right breast was then prepped and draped in a sterile fashion and a second timeout was performed.  Proper patient, site and procedure verified.  She received appropriate preoperative antibiotics.  Films were available for review.  Signal was picked up of the seed in the right upper outer quadrant.  Curvilinear incision was made directly over this to minimize the amount of excessive tissue to be removed to hopefully preserve as much cosmesis as possible.  Dissection was carried down all tissue around the seed and clip were excised.  This was  taken all the way to the pectoralis muscle which was quite thin.  The implant was never exposed but some of the posterior margin is the pectoralis muscle.  This was sent for imaging and the clip was in the specimen since the seed was sent  separately.  The wound was irrigated.  Clips  were placed.  Hemostasis achieved with cautery.  The wound was then carefully closed with a deep layer 3-0 Vicryl and 4-0 Monocryl for the skin. ? ?The right axilla was then examined with the mag trace probe.  The hotspot was identified.  A 4 cm transverse incision was made along the inferior border of the right axilla.  Dissection was carried down to the level 1 content basin.  There were 2 hot nodes which were level 1 nodes removed.  These grossly appeared normal.  Upon palpation there were no other nodes.  These had taken up the tracer in the background counts approached baseline.  The long thoracic nerve, thoracodorsal trunk and axillary vein were all preserved.  Hemostasis achieved with cautery and [place]ista.  Prior to this irrigation was used.  Once hemostasis was assured, the wound was closed with a deep layer 3-0 Vicryl.  4 Monocryl used to close skin in a subcuticular fashion.  Dermabond was applied.  All counts found to be correct.  Breast binder placed.  The patient was awoke extubated taken to recovery in satisfactory condition. ? ? ?

## 2021-11-25 NOTE — Anesthesia Preprocedure Evaluation (Addendum)
Anesthesia Evaluation  ?Patient identified by MRN, date of birth, ID band ?Patient awake ? ? ? ?Reviewed: ?Allergy & Precautions, NPO status , Patient's Chart, lab work & pertinent test results ? ?History of Anesthesia Complications ?Negative for: history of anesthetic complications ? ?Airway ?Mallampati: II ? ?TM Distance: >3 FB ?Neck ROM: Full ? ? ? Dental ? ?(+) Dental Advisory Given ?  ?Pulmonary ?neg pulmonary ROS,  ?  ?Pulmonary exam normal ? ? ? ? ? ? ? Cardiovascular ?negative cardio ROS ?Normal cardiovascular exam ? ? ?  ?Neuro/Psych ?negative neurological ROS ? negative psych ROS  ? GI/Hepatic ?negative GI ROS, Neg liver ROS,   ?Endo/Other  ?negative endocrine ROS ? Renal/GU ?negative Renal ROS  ? ?  ?Musculoskeletal ?negative musculoskeletal ROS ?(+)  ? Abdominal ?  ?Peds ? Hematology ?negative hematology ROS ?(+)   ?Anesthesia Other Findings ? ? Reproductive/Obstetrics ? ?Breast cancer ?Endometriosis ?Ovarian mass s/p oophorectomy ?s/p hysterectomy ? ? ?  ? ? ? ? ? ? ? ? ? ? ? ? ? ?  ?  ? ? ? ? ? ? ? ?Anesthesia Physical ?Anesthesia Plan ? ?ASA: 2 ? ?Anesthesia Plan: General  ? ?Post-op Pain Management: Regional block* and Tylenol PO (pre-op)*  ? ?Induction: Intravenous ? ?PONV Risk Score and Plan: 3 and Treatment may vary due to age or medical condition, Ondansetron, Dexamethasone, Midazolam and Scopolamine patch - Pre-op ? ?Airway Management Planned: LMA ? ?Additional Equipment: None ? ?Intra-op Plan:  ? ?Post-operative Plan: Extubation in OR ? ?Informed Consent: I have reviewed the patients History and Physical, chart, labs and discussed the procedure including the risks, benefits and alternatives for the proposed anesthesia with the patient or authorized representative who has indicated his/her understanding and acceptance.  ? ? ? ?Dental advisory given ? ?Plan Discussed with: CRNA and Anesthesiologist ? ?Anesthesia Plan Comments:   ? ? ? ? ? ?Anesthesia Quick  Evaluation ? ?

## 2021-11-25 NOTE — Progress Notes (Signed)
Assisted Dr. Fransisco Beau with right, ultrasound guided, pectoralis block. Side rails up, monitors on throughout procedure. See vital signs in flow sheet. Tolerated Procedure well. ?

## 2021-11-25 NOTE — Anesthesia Procedure Notes (Signed)
Procedure Name: LMA Insertion ?Date/Time: 11/25/2021 9:43 AM ?Performed by: Glory Buff, CRNA ?Pre-anesthesia Checklist: Patient identified, Emergency Drugs available, Suction available and Patient being monitored ?Patient Re-evaluated:Patient Re-evaluated prior to induction ?Oxygen Delivery Method: Circle system utilized ?Preoxygenation: Pre-oxygenation with 100% oxygen ?Induction Type: IV induction ?LMA: LMA inserted ?LMA Size: 4.0 ?Number of attempts: 1 ?Placement Confirmation: CO2 detector ?Tube secured with: Tape ?Dental Injury: Teeth and Oropharynx as per pre-operative assessment  ? ? ? ? ?

## 2021-11-25 NOTE — Transfer of Care (Signed)
Immediate Anesthesia Transfer of Care Note ? ?Patient: Carly Miller ? ?Procedure(s) Performed: RIGHT BREAST LUMPECTOMY WITH RADIOACTIVE SEED AND SENTINEL LYMPH NODE BIOPSY (Right: Breast) ? ?Patient Location: PACU ? ?Anesthesia Type:General ? ?Level of Consciousness: drowsy, patient cooperative and responds to stimulation ? ?Airway & Oxygen Therapy: Patient Spontanous Breathing and Patient connected to face mask oxygen ? ?Post-op Assessment: Report given to RN and Post -op Vital signs reviewed and stable ? ?Post vital signs: Reviewed and stable ? ?Last Vitals:  ?Vitals Value Taken Time  ?BP    ?Temp    ?Pulse 80 11/25/21 1119  ?Resp 22 11/25/21 1119  ?SpO2 100 % 11/25/21 1119  ?Vitals shown include unvalidated device data. ? ?Last Pain:  ?Vitals:  ? 11/25/21 0811  ?TempSrc: Oral  ?PainSc: 0-No pain  ?   ? ?Patients Stated Pain Goal: 5 (11/25/21 8209) ? ?Complications: No notable events documented. ?

## 2021-11-25 NOTE — Anesthesia Postprocedure Evaluation (Signed)
Anesthesia Post Note ? ?Patient: Carly Miller ? ?Procedure(s) Performed: RIGHT BREAST LUMPECTOMY WITH RADIOACTIVE SEED AND SENTINEL LYMPH NODE BIOPSY (Right: Breast) ? ?  ? ?Patient location during evaluation: PACU ?Anesthesia Type: General ?Level of consciousness: awake and alert ?Pain management: pain level controlled ?Vital Signs Assessment: post-procedure vital signs reviewed and stable ?Respiratory status: spontaneous breathing, nonlabored ventilation and respiratory function stable ?Cardiovascular status: stable and blood pressure returned to baseline ?Anesthetic complications: no ? ? ?No notable events documented. ? ?Last Vitals:  ?Vitals:  ? 11/25/21 1139 11/25/21 1145  ?BP: 114/79 112/72  ?Pulse: 79 80  ?Resp: (!) 21 18  ?Temp:  (!) 36.4 ?C  ?SpO2: 100% 97%  ?  ?Last Pain:  ?Vitals:  ? 11/25/21 1145  ?TempSrc:   ?PainSc: 0-No pain  ? ? ?  ?  ?  ?  ?  ?  ? ?Audry Pili ? ? ? ? ?

## 2021-11-25 NOTE — Discharge Instructions (Addendum)
?Post Anesthesia Home Care Instructions ? ?Activity: ?Get plenty of rest for the remainder of the day. A responsible individual must stay with you for 24 hours following the procedure.  ?For the next 24 hours, DO NOT: ?-Drive a car ?-Paediatric nurse ?-Drink alcoholic beverages ?-Take any medication unless instructed by your physician ?-Make any legal decisions or sign important papers. ? ?Meals: ?Start with liquid foods such as gelatin or soup. Progress to regular foods as tolerated. Avoid greasy, spicy, heavy foods. If nausea and/or vomiting occur, drink only clear liquids until the nausea and/or vomiting subsides. Call your physician if vomiting continues. ? ?Special Instructions/Symptoms: ?Your throat may feel dry or sore from the anesthesia or the breathing tube placed in your throat during surgery. If this causes discomfort, gargle with warm salt water. The discomfort should disappear within 24 hours. ? ?If you had a scopolamine patch placed behind your ear for the management of post- operative nausea and/or vomiting: ? ?1. The medication in the patch is effective for 72 hours, after which it should be removed.  Wrap patch in a tissue and discard in the trash. Wash hands thoroughly with soap and water. ?2. You may remove the patch earlier than 72 hours if you experience unpleasant side effects which may include dry mouth, dizziness or visual disturbances. ?3. Avoid touching the patch. Wash your hands with soap and water after contact with the patch. ?    ? ? ? ?Next dose of Tylenol can be given after 2:25 PM. ? ? ? ? ? ? ? ? ? ?[facility] ?Office Phone Number [phone] ? ?BREAST BIOPSY/ PARTIAL MASTECTOMY: POST OP INSTRUCTIONS ? ?Always review your discharge instruction sheet given to you by the facility where your surgery was performed. ? ?IF YOU HAVE DISABILITY OR FAMILY LEAVE FORMS, YOU MUST BRING THEM TO THE OFFICE FOR PROCESSING.  DO NOT GIVE THEM TO YOUR DOCTOR. ? ?A prescription  for pain medication may be given to you upon discharge.  Take your pain medication as prescribed, if needed.  If narcotic pain medicine is not needed, then you may take acetaminophen (Tylenol) or ibuprofen (Advil) as needed. ?Take your usually prescribed medications unless otherwise directed ?If you need a refill on your pain medication, please contact your pharmacy.  They will contact our office to request authorization.  Prescriptions will not be filled after 5pm or on week-ends. ?You should eat very light the first 24 hours after surgery, such as soup, crackers, pudding, etc.  Resume your normal diet the day after surgery. ?Most patients will experience some swelling and bruising in the breast.  Ice packs and a good support bra will help.  Swelling and bruising can take several days to resolve.  ?It is common to experience some constipation if taking pain medication after surgery.  Increasing fluid intake and taking a stool softener will usually help or prevent this problem from occurring.  A mild laxative (Milk of Magnesia or Miralax) should be taken according to package directions if there are no bowel movements after 48 hours. ?Unless discharge instructions indicate otherwise, you may remove your bandages 24-48 hours after surgery, and you may shower at that time.  You may have steri-strips (small skin tapes) in place directly over the incision.  These strips should be left on the skin for 7-10 days.  If your surgeon used skin glue on the incision, you may shower in 24 hours.  The glue will flake off over the next 2-3 weeks.  Any  sutures or staples will be removed at the office during your follow-up visit. ?ACTIVITIES:  You may resume regular daily activities (gradually increasing) beginning the next day.  Wearing a good support bra or sports bra minimizes pain and swelling.  You may have sexual intercourse when it is comfortable. ?You may drive when you no longer are taking prescription pain medication, you  can comfortably wear a seatbelt, and you can safely maneuver your car and apply brakes. ?RETURN TO WORK:  ______________________________________________________________________________________ ?You should see your doctor in the office for a follow-up appointment approximately two weeks after your surgery.  Your doctor?s nurse will typically make your follow-up appointment when she calls you with your pathology report.  Expect your pathology report 2-3 business days after your surgery.  You may call to check if you do not hear from [place] after three days. ?OTHER INSTRUCTIONS: _______________________________________________________________________________________________ _____________________________________________________________________________________________________________________________________ ?_____________________________________________________________________________________________________________________________________ ?_____________________________________________________________________________________________________________________________________ ? ?WHEN TO CALL YOUR DOCTOR: ?Fever over 101.0 ?Nausea and/or vomiting. ?Extreme swelling or bruising. ?Continued bleeding from incision. ?Increased pain, redness, or drainage from the incision. ? ?The clinic staff is available to answer your questions during regular business hours.  Please don?t hesitate to call and ask to speak to one of the nurses for clinical concerns.  If you have a medical emergency, go to the nearest emergency room or call 911.  A surgeon from [facility] is always on call at the hospital. ? ?For further questions, please visit [email or web address]   ? ?

## 2021-11-25 NOTE — H&P (Signed)
History of Present Illness: ?Carly Miller is a 46 y.o. female who is seen today as an office consultation at the request of Dr. Pasty Arch for evaluation of Breast Cancer ?.  ? ?Patient seen today in the multidisciplinary clinic for evaluation of newly diagnosed right breast cancer. Patient had a mass right breast upper outer quadrant since December 2022. Work-up revealed a 2 cm mass or area of distortion right breast upper outer quadrant which was not clear. 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 No family history of breast cancer. ? ?Review of Systems: ?A complete review of systems was obtained from the patient. I have reviewed this information and discussed as appropriate with the patient. See HPI as well for other ROS. ? ? ? ?Medical History: ?Past Medical History:  ?Diagnosis Date  ? Hyperlipidemia  ? ?There is no problem list on file for this patient. ? ?Past Surgical History:  ?Procedure Laterality Date  ? HYSTERECTOMY  ? ? ?Allergies  ?Allergen Reactions  ? Naproxen Sodium Other (See Comments) and Hives  ?"feels like I'm choking" ?"Feel like I'm choking when I take it" ? ? ?No current outpatient medications on file prior to visit.  ? ?No current facility-administered medications on file prior to visit.  ? ?Family History  ?Problem Relation Age of Onset  ? Obesity Mother  ? Hyperlipidemia (Elevated cholesterol) Mother  ? Diabetes Father  ? ? ?Social History  ? ?Tobacco Use  ?Smoking Status Never  ?Smokeless Tobacco Never  ? ? ?Social History  ? ?Socioeconomic History  ? Marital status: Unknown  ?Tobacco Use  ? Smoking status: Never  ? Smokeless tobacco: Never  ?Substance and Sexual Activity  ? Alcohol use: Not Currently  ? Drug use: Not Currently  ? ?Objective:  ?There were no vitals filed for this visit.  ?There is no height or weight on file to calculate BMI. ? ?Physical Exam ?Constitutional:  ?Appearance: Normal appearance.  ?HENT:   ?Head: Normocephalic.  ?Eyes:  ?Pupils: Pupils are equal, round, and reactive to light.  ?Cardiovascular:  ?Rate and Rhythm: Normal rate.  ?Pulmonary:  ?Effort: Pulmonary effort is normal.  ?Breath sounds: No stridor.  ?Chest:  ?Breasts: ?Left: Normal.  ? ?Musculoskeletal:  ?Cervical back: Normal range of motion.  ?Lymphadenopathy:  ?Upper Body:  ?Right upper body: No supraclavicular or axillary adenopathy.  ?Left upper body: No supraclavicular or axillary adenopathy.  ?Neurological:  ?General: No focal deficit present.  ?Mental Status: She is alert and oriented to person, place, and time.  ? ? ? ?Labs, Imaging and Diagnostic Testing: ?Please see above ? ?Assessment and Plan:  ? ?Diagnoses and all orders for this visit: ? ?Malignant neoplasm of upper-outer quadrant of right breast in female, estrogen receptor positive (CMS-HCC) ? ? ? ?Discussed breast conserving surgery versus mastectomy and reconstruction. Long-term expectations, recovery, complications of each as well as recurrence rates discussed. She is opted for a right breast seed localized lumpectomy with right axillary sentinel mapping. We will obtain an MRI to evaluate location of implant with respect to her cancer. Discussed risk of bleeding, infection, implant rupture, implant replacement, cosmetic deformity, swelling, seroma, lymphedema, stiffness to the shoulder, arm numbness, the need further treatment standard procedures. She agrees to proceed. Discussed seed localization and potential postsurgical treatment. ? ?No follow-ups on file. ? ?Kennieth Francois, MD  ?

## 2021-11-26 ENCOUNTER — Encounter (HOSPITAL_BASED_OUTPATIENT_CLINIC_OR_DEPARTMENT_OTHER): Payer: Self-pay | Admitting: Surgery

## 2021-11-27 LAB — SURGICAL PATHOLOGY

## 2021-11-28 ENCOUNTER — Encounter: Payer: Self-pay | Admitting: Surgery

## 2021-12-02 ENCOUNTER — Encounter: Payer: Self-pay | Admitting: *Deleted

## 2021-12-02 ENCOUNTER — Telehealth: Payer: Self-pay | Admitting: *Deleted

## 2021-12-02 NOTE — Telephone Encounter (Signed)
Ordered oncotype per Dr. Chryl Heck. Faxed requisition to pathology and [facility]. ?

## 2021-12-11 ENCOUNTER — Telehealth: Payer: Self-pay | Admitting: *Deleted

## 2021-12-11 ENCOUNTER — Inpatient Hospital Stay: Payer: BC Managed Care – PPO | Attending: Hematology and Oncology | Admitting: Hematology and Oncology

## 2021-12-11 ENCOUNTER — Encounter: Payer: Self-pay | Admitting: Hematology and Oncology

## 2021-12-11 ENCOUNTER — Other Ambulatory Visit: Payer: Self-pay

## 2021-12-11 ENCOUNTER — Encounter: Payer: BC Managed Care – PPO | Admitting: Genetic Counselor

## 2021-12-11 ENCOUNTER — Encounter: Payer: Self-pay | Admitting: *Deleted

## 2021-12-11 ENCOUNTER — Other Ambulatory Visit: Payer: BC Managed Care – PPO

## 2021-12-11 DIAGNOSIS — C50411 Malignant neoplasm of upper-outer quadrant of right female breast: Secondary | ICD-10-CM | POA: Insufficient documentation

## 2021-12-11 DIAGNOSIS — Z170 Estrogen receptor positive status [ER+]: Secondary | ICD-10-CM | POA: Diagnosis not present

## 2021-12-11 NOTE — Telephone Encounter (Signed)
Received onoctype score of 17. Physician team notified. Called pt with results, discussed chemo not recommended and next step is xrt with Dr.Kinard. Received verbal understanding. Referral placed. ?

## 2021-12-11 NOTE — Progress Notes (Signed)
[facility] ?CONSULT NOTE ? ?Patient Care Team: ?Louretta Shorten, MD as PCP - General (Obstetrics and Gynecology) ?Rockwell Germany, RN as Oncology Nurse Navigator ?Mauro Kaufmann, RN as Oncology Nurse Navigator ?Erroll Luna, MD as Consulting Physician (General Surgery) ?Benay Pike, MD as Consulting Physician (Hematology and Oncology) ?Gery Pray, MD as Consulting Physician (Radiation Oncology) ? ?CHIEF COMPLAINTS/PURPOSE OF CONSULTATION:  ?Newly diagnosed breast cancer ? ?HISTORY OF PRESENTING ILLNESS:  ?Carly Miller 46 y.o. female is here because of recent diagnosis of right breast cancer ? ?Diagnostic mammogram 10/10/2021 ?Indeterminate right breast mass at the 11 o'clock position corresponding to the patient, recommend ultrasound-guided biopsy of the, no suspicious right axillary lymphadenopathy. ?  ?Targeted ultrasound is performed, showing an irregular, hypoechoic ?mass with associated vascularity at the 11 o'clock position 2 cm ?from the nipple. It measures 1.9 x 1.6 x 1.0 cm. This corresponds ?with the patient's palpable lump. Evaluation of the right axilla ?demonstrates no suspicious lymphadenopathy. ? ?Biopsy of the area showed IDC, grade 1-2, ER positive 100% moderate staining, PR positive 100% strong staining, Her 2 neg. Ki 5%. ? ?Since last visit she had a lumpectomy with right axillary sentinel lymph node mapping.  Pathology from that showed invasive ductal carcinoma, 2 cm, grade 1, DCIS with intermediate grade 4 out of 4 sentinel lymph nodes negative previous prognostics ER 100%, positive, strong staining intensity, PR 100%, positive, strong staining intensity, HER2 negative, Ki-67 of 5% ?She has been healing very well from surgery and is quite pleased with postop course so far.  She has not been scheduled to see radiation oncology yet.  Oncotype was sent on 12/02/2021, awaiting results ?Rest of the pertinent 10 point ROS reviewed and negative ? ?I reviewed her records extensively  and collaborated the history with the patient. ? ?SUMMARY OF ONCOLOGIC HISTORY: ?Oncology History  ?Malignant neoplasm of upper-outer quadrant of right breast in female, estrogen receptor positive ([place])  ?10/31/2021 Initial Diagnosis  ? Malignant neoplasm of upper-outer quadrant of right breast in female, estrogen receptor positive ([place]) ?  ?11/05/2021 Cancer Staging  ? Staging form: Breast, AJCC 8th Edition ?- Clinical: Stage IA (cT1c, cN0, cM0, G2, ER+, PR+, HER2-) - Signed by Benay Pike, MD on 11/05/2021 ?Stage prefix: Initial diagnosis ?Histologic grading system: 3 grade system ? ?  ? Genetic Testing  ? [facility] CancerNext-Expanded Panel was Negative. Report date is 11/17/2021. ? ?The CancerNext-Expanded gene panel offered by [facility] and includes sequencing, rearrangement, and RNA analysis for the following 77 genes: AIP, ALK, APC, ATM, AXIN2, BAP1, BARD1, BLM, BMPR1A, BRCA1, BRCA2, BRIP1, CDC73, CDH1, CDK4, CDKN1B, CDKN2A, CHEK2, CTNNA1, DICER1, FANCC, FH, FLCN, GALNT12, KIF1B, LZTR1, MAX, MEN1, MET, MLH1, MSH2, MSH3, MSH6, MUTYH, NBN, NF1, NF2, NTHL1, PALB2, PHOX2B, PMS2, POT1, PRKAR1A, PTCH1, PTEN, RAD51C, RAD51D, RB1, RECQL, RET, SDHA, SDHAF2, SDHB, SDHC, SDHD, SMAD4, SMARCA4, SMARCB1, SMARCE1, STK11, SUFU, TMEM127, TP53, TSC1, TSC2, VHL and XRCC2 (sequencing and deletion/duplication); EGFR, EGLN1, HOXB13, KIT, MITF, PDGFRA, POLD1, and POLE (sequencing only); EPCAM and GREM1 (deletion/duplication only).  ?  ? ? ?MEDICAL HISTORY:  ?Past Medical History:  ?Diagnosis Date  ? Breast cancer ([place])   ? Endometriosis   ? Hematuria   ? Hyperlipidemia   ? Mass of ovary   ? RIGHT  ? Pelvic pain in female   ? Urgency of urination   ? UTI (lower urinary tract infection)   ? ? ?SURGICAL HISTORY: ?Past Surgical History:  ?Procedure Laterality Date  ? ABDOMINAL HYSTERECTOMY N/A 08/31/2013  ?  Procedure: HYSTERECTOMY ABDOMINAL, LYSIS OF ADHESIONS;  Surgeon: Luz Lex, MD;  Location: [facility];   Service: Gynecology;  Laterality: N/A;  ? ABSCESS DRAINAGE  09/22/1999  ? RECTAL  ? ANAL SPHINCTEROTOMY  09/21/1998  ? BREAST BIOPSY Right 10/27/2021  ? BREAST LUMPECTOMY WITH RADIOACTIVE SEED AND SENTINEL LYMPH NODE BIOPSY Right 11/25/2021  ? Procedure: RIGHT BREAST LUMPECTOMY WITH RADIOACTIVE SEED AND SENTINEL LYMPH NODE BIOPSY;  Surgeon: Erroll Luna, MD;  Location: [place];  Service: General;  Laterality: Right;  90 MINUTES ROOM 8  ? DIAGNOSTIC LAPAROSCOPY LEFT OOPHORECTOMY  08/31/2007  ? ENDOMETRIOMA  ? LAPAROSCOPIC CHOLECYSTECTOMY  06/21/2012  ? LAPAROSCOPY N/A 08/31/2013  ? Procedure: LAPAROSCOPY DIAGNOSTIC;  Surgeon: Luz Lex, MD;  Location: [facility];  Service: Gynecology;  Laterality: N/A;  ? ? ?SOCIAL HISTORY: ?Social History  ? ?Socioeconomic History  ? Marital status: Married  ?  Spouse name: Not on file  ? Number of children: Not on file  ? Years of education: Not on file  ? Highest education level: Not on file  ?Occupational History  ? Not on file  ?Tobacco Use  ? Smoking status: Never  ? Smokeless tobacco: Never  ?Vaping Use  ? Vaping Use: Never used  ?Substance and Sexual Activity  ? Alcohol use: No  ? Drug use: No  ? Sexual activity: Not on file  ?Other Topics Concern  ? Not on file  ?Social History Narrative  ? Not on file  ? ?Social Determinants of Health  ? ?Financial Resource Strain: Low Risk   ? Difficulty of Paying Living Expenses: Not hard at all  ?Food Insecurity: No Food Insecurity  ? Worried About Charity fundraiser in the Last Year: Never true  ? Ran Out of Food in the Last Year: Never true  ?Transportation Needs: No Transportation Needs  ? Lack of Transportation (Medical): No  ? Lack of Transportation (Non-Medical): No  ?Physical Activity: Not on file  ?Stress: Not on file  ?Social Connections: Not on file  ?Intimate Partner Violence: Not on file  ? ? ?FAMILY HISTORY: ?Family History  ?Problem Relation Age of Onset  ? Asthma Mother   ? Thyroid  disease Mother   ? Asthma Father   ? Diabetes Father   ? Thyroid disease Father   ? Kidney disease Sister   ? Thyroid disease Maternal Aunt   ? Thyroid disease Maternal Uncle   ? Leukemia Maternal Grandmother   ?     dx. 64s  ? Breast cancer Neg Hx   ? ? ?ALLERGIES:  is allergic to aleve [naproxen sodium]. ? ?MEDICATIONS:  ?Current Outpatient Medications  ?Medication Sig Dispense Refill  ? escitalopram (LEXAPRO) 10 MG tablet Take 10 mg by mouth daily.    ? Multiple Vitamin (MULTIVITAMIN) capsule Take 1 capsule by mouth daily.    ? oxyCODONE (OXY IR/ROXICODONE) 5 MG immediate release tablet Take 1 tablet (5 mg total) by mouth every 6 (six) hours as needed for severe pain. 15 tablet 0  ? Red Yeast Rice Extract 600 MG TABS Take by mouth.    ? ?No current facility-administered medications for this visit.  ? ? ? ?PHYSICAL EXAMINATION: ? ?ECOG PERFORMANCE STATUS: 0 - Asymptomatic ? ?Vitals:  ? 12/11/21 0930  ?BP: 99/65  ?Pulse: 87  ?Resp: 16  ?Temp: 97.9 ?F (36.6 ?C)  ?SpO2: 100%  ? ?Filed Weights  ? 12/11/21 0930  ?Weight: 159 lb 4.8 oz (72.3 kg)  ? ? ?  GENERAL:alert, no distress and comfortable ?SKIN: skin color, texture, turgor are normal, no rashes or significant lesions ?EYES: normal, conjunctiva are pink and non-injected, sclera clear ?OROPHARYNX:no exudate, no erythema and lips, buccal mucosa, and tongue normal  ?NECK: supple, thyroid normal size, non-tender, without nodularity ?LYMPH:  no palpable lymphadenopathy in the cervical, axillary or inguinal ?LUNGS: clear to auscultation and percussion with normal breathing effort ?HEART: regular rate & rhythm and no murmurs and no lower extremity edema ?ABDOMEN:abdomen soft, non-tender and normal bowel sounds ?Musculoskeletal:no cyanosis of digits and no clubbing  ?PSYCH: alert & oriented x 3 with fluent speech ?NEURO: no focal motor/sensory deficits ?BREAST: palpable right breast nodule in the upper inner quadrant measuring 1-2 cms. No palpable axillary or  supraclavicular lymphadenopathy  ? ?LABORATORY DATA:  ?I have reviewed the data as listed ?Lab Results  ?Component Value Date  ? WBC 4.6 11/05/2021  ? HGB 13.1 11/05/2021  ? HCT 40.9 11/05/2021  ? MCV 93.6 11/05/2021  ? P

## 2021-12-11 NOTE — Assessment & Plan Note (Signed)
This is a very pleasant 46 yr old premenopausal female patient with T1cN0M0 Grade 1-2 ER PR strongly positive Her 2 negative IDC as well as grade 2 DCIS referred to [facility] [place] for consideration of adjuvant therapy. ?Given small tumor and strongly ER PR positive and Her 2 neg, we discussed with upfront surgery followed by oncotype testing. ?She had right breast lumpectomy with axillary sentinel lymph node mapping.  Pathology from lumpectomy showed invasive ductal carcinoma, grade 1 measuring 2 cm, DCIS, intermediate grade, 4 out of 4 sentinel lymph nodes negative.  Prognostic showed ER 100% positive strong staining intensity, PR 100% positive strong staining intensity, HER2 negative and Ki-67 of 5%. ?She is here for postop pathology review and additional recommendations.  Oncotype was sent on 12/02/2021, awaiting pathology results.  However given her low-grade tumor, ER/PR strongly positive although her age might tip to score a bit higher she most likely will not need any adjuvant chemotherapy.  We will of course await official Oncotype results.  We have also recommended that she proceed with hydration radiation appointment.  Following completion of adjuvant radiation she will take antiestrogen therapy.  She has had bilateral oophorectomy and she could receive tamoxifen versus aromatase inhibitors.  She is however leaning towards tamoxifen because of benefits on bone density and because of long track record.  She understands that tamoxifen is slightly inferior to aromatase inhibitors and efficacy.  We have in the past discussed about mechanism of action of tamoxifen and aromatase inhibitors in detail.  She was recommended to return to clinic in about 6 to 7 weeks from now to initiate antiestrogen therapy.  She is agreeable to all the recommendations.  ?

## 2021-12-11 NOTE — Telephone Encounter (Signed)
Received oncotype score of 17 ?

## 2021-12-17 NOTE — Progress Notes (Signed)
Location of Breast Cancer: upper-outer quadrant of right breast ? ?Histology per Pathology Report:  ?A. BREAST, RIGHT W/SEED, LUMPECTOMY:  ?- Invasive ductal carcinoma, 2 cm, grade 1  ?- Ductal carcinoma in situ, intermediate grade  ?- Biopsy site changes  ?- Fibrocystic change  ?- See oncology table  ? ?B. BREAST, RIGHT ADDITIONAL ANTERIOR-SUPERIOR MARGIN, EXCISION:  ?- Fibrocystic change, negative for carcinoma  ? ?C. BREAST, RIGHT ADDITIONAL MEDIAL-POSTERIOR MARGIN, EXCISION:  ?- Fibrocystic change, negative for carcinoma  ? ?D. BREAST, RIGHT ADDITIONAL INFERIOR-LATERAL MARGIN, EXCISION:  ?- Fibrocystic change, negative for carcinoma  ? ?E. SENTINEL LYMPH NODE, RIGHT AXILLARY, BIOPSY:  ?- Lymph node, negative for carcinoma (0/1)  ? ?F. SENTINEL LYMPH NODE, RIGHT AXILLARY, BIOPSY:  ?- Lymph node, negative for carcinoma (0/1)  ? ?G. SENTINEL LYMPH NODE, RIGHT AXILLARY, BIOPSY:  ?- Lymph node, negative for carcinoma (0/1)  ? ?H. SENTINEL LYMPH NODE, RIGHT AXILLARY, BIOPSY:  ? ?- Lymph node, negative for carcinoma (0/1)  ? ?Breast, right, needle core biopsy, 75m mass 12:00 ?- FIBROADENOMA ?- NO MALIGNANCY IDENTIFIED ? ?Breast, right, needle core biopsy, 11 o'clock, 2cmfn ?- INVASIVE DUCTAL CARCINOMA, SEE NOTE ?- DUCTAL CARCINOMA IN SITU, INTERMEDIATE GRADE ? ?Receptor Status:  ?Estrogen Receptor: 100%, positive, strong staining intensity  ?Progesterone Receptor: 100%, positive, strong staining  ?intensity  ?           HER2: Negative ([place])  ?           Ki-67: <5%  ? ?Did patient present with symptoms (if so, please note symptoms) or was this found on screening mammography?: presented with a palpable right breast lump ? ?Past/Anticipated interventions by surgeon, if any:  ?Procedure: Right breast seed localized lumpectomy with right axillary sentinel lymph node mapping utilizing mag trace ?Surgeon: TErroll Luna MD ? ?Past/Anticipated interventions by medical oncology, if any: Dr IChryl Heck?given her low-grade tumor,  ER/PR strongly positive although her age might tip to score a bit higher she most likely will not need any adjuvant chemotherapy.  We will of course await official Oncotype results.  We have also recommended that she proceed with hydration radiation appointment.  Following completion of adjuvant radiation she will take antiestrogen therapy.  ? ?Lymphedema issues, if any:  no  ? ?Pain issues, if any:  no  ? ?SAFETY ISSUES: ?Prior radiation? no ?Pacemaker/ICD? no ?Possible current pregnancy?no, hysterectomy ?Is the patient on methotrexate? no ? ?Current Complaints / other details:  none ?   ? ? ? ?

## 2021-12-18 ENCOUNTER — Ambulatory Visit: Payer: BC Managed Care – PPO | Attending: Surgery | Admitting: Physical Therapy

## 2021-12-18 ENCOUNTER — Encounter: Payer: Self-pay | Admitting: Physical Therapy

## 2021-12-18 ENCOUNTER — Encounter: Payer: Self-pay | Admitting: *Deleted

## 2021-12-18 DIAGNOSIS — Z483 Aftercare following surgery for neoplasm: Secondary | ICD-10-CM | POA: Diagnosis present

## 2021-12-18 DIAGNOSIS — C50411 Malignant neoplasm of upper-outer quadrant of right female breast: Secondary | ICD-10-CM | POA: Diagnosis present

## 2021-12-18 DIAGNOSIS — Z170 Estrogen receptor positive status [ER+]: Secondary | ICD-10-CM | POA: Diagnosis present

## 2021-12-18 DIAGNOSIS — R293 Abnormal posture: Secondary | ICD-10-CM | POA: Insufficient documentation

## 2021-12-18 NOTE — Therapy (Signed)
?OUTPATIENT PHYSICAL THERAPY BREAST CANCER POST OP FOLLOW UP ? ? ?Patient Name: Carly Miller ?MRN: [id number] ?DOB:06/23/1976, 46 y.o., female ?Today's Date: 12/18/2021 ? ? PT End of Session - 12/18/21 1058   ? ? Visit Number 2   ? Number of Visits 2   ? PT Start Time 1057   ? PT Stop Time 1128   ? PT Time Calculation (min) 31 min   ? Activity Tolerance Patient tolerated treatment well   ? Behavior During Therapy [facility] for tasks assessed/performed   ? ?  ?  ? ?  ? ? ?Past Medical History:  ?Diagnosis Date  ? Breast cancer ([place])   ? Endometriosis   ? Hematuria   ? Hyperlipidemia   ? Mass of ovary   ? RIGHT  ? Pelvic pain in female   ? Urgency of urination   ? UTI (lower urinary tract infection)   ? ?Past Surgical History:  ?Procedure Laterality Date  ? ABDOMINAL HYSTERECTOMY N/A 08/31/2013  ? Procedure: HYSTERECTOMY ABDOMINAL, LYSIS OF ADHESIONS;  Surgeon: Luz Lex, MD;  Location: [facility];  Service: Gynecology;  Laterality: N/A;  ? ABSCESS DRAINAGE  09/22/1999  ? RECTAL  ? ANAL SPHINCTEROTOMY  09/21/1998  ? BREAST BIOPSY Right 10/27/2021  ? BREAST LUMPECTOMY WITH RADIOACTIVE SEED AND SENTINEL LYMPH NODE BIOPSY Right 11/25/2021  ? Procedure: RIGHT BREAST LUMPECTOMY WITH RADIOACTIVE SEED AND SENTINEL LYMPH NODE BIOPSY;  Surgeon: Erroll Luna, MD;  Location: [place];  Service: General;  Laterality: Right;  90 MINUTES ROOM 8  ? DIAGNOSTIC LAPAROSCOPY LEFT OOPHORECTOMY  08/31/2007  ? ENDOMETRIOMA  ? LAPAROSCOPIC CHOLECYSTECTOMY  06/21/2012  ? LAPAROSCOPY N/A 08/31/2013  ? Procedure: LAPAROSCOPY DIAGNOSTIC;  Surgeon: Luz Lex, MD;  Location: [facility];  Service: Gynecology;  Laterality: N/A;  ? ?Patient Active Problem List  ? Diagnosis Date Noted  ? Genetic testing 11/17/2021  ? Malignant neoplasm of upper-outer quadrant of right breast in female, estrogen receptor positive ([place]) 10/31/2021  ? S/P TAH (total abdominal hysterectomy) 08/31/2013  ? TOA (tubo-ovarian  abscess) 08/31/2013  ? Endometrioma of ovary 08/31/2013  ? Pelvic adhesions 08/31/2013  ? S/P removal of right ovary 08/31/2013  ? ? ?PCP: Louretta Shorten, MD ? ?REFERRING PROVIDER: Erroll Luna, MD ? ?REFERRING DIAG: s/p right lumpectomy and sentinel node biopsy ? ?THERAPY DIAG:  ?Malignant neoplasm of upper-outer quadrant of right breast in female, estrogen receptor positive ([place]) ? ?Abnormal posture ? ?Aftercare following surgery for neoplasm ? ?ONSET DATE: 11/25/2021 ? ?SUBJECTIVE:                                                                                                                                                                                          ? ?  SUBJECTIVE STATEMENT: ?Patient reports she underwent a right lumpectomy and sentinel node biopsy (4 negative nodes) on 11/25/2021. Her Oncotype score was 17 so she will proceed to radiation and anti-estrogen therapy. ? ?PERTINENT HISTORY:  ?Patient was diagnosed on 10/10/2021 with 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 She underwent a right lumpectomy and sentinel node biopsy (4 negative nodes) on 11/25/2021. It is ER/PR positive and HER2 negative with a Ki67 of < 5%. She had breast implants placed in 2016. ? ?PATIENT GOALS:  Reassess how my recovery is going related to arm function, pain, and swelling. ? ?PAIN:  ?Are you having pain? No - some numbness posterior right arm ? ?PRECAUTIONS: Recent Surgery, right UE Lymphedema risk, ? ?ACTIVITY LEVEL / LEISURE: She is walking daily 45 minutes ? ? ?OBJECTIVE:  ? ?PATIENT SURVEYS:  ?QUICK DASH: ? Carly Miller - 12/18/21 0001   ? ? Open a tight or new jar Moderate difficulty   ? Do heavy household chores (wash walls, wash floors) Mild difficulty   ? Carry a shopping bag or briefcase No difficulty   ? Wash your back No difficulty   ? Use a knife to cut food No difficulty   ? Recreational activities in which you take some force or impact through your arm, shoulder, or hand (golf, hammering, tennis)  Mild difficulty   ? During the past week, to what extent has your arm, shoulder or hand problem interfered with your normal social activities with family, friends, neighbors, or groups? Slightly   ? During the past week, to what extent has your arm, shoulder or hand problem limited your work or other regular daily activities Slightly   ? Arm, shoulder, or hand pain. Mild   ? Tingling (pins and needles) in your arm, shoulder, or hand Mild   ? Difficulty Sleeping No difficulty   ? DASH Score 18.18 %   ? ?  ?  ? ?  ? ? ? ?OBSERVATIONS: ? Right breast and axillary incisions appear to be healing well. No redness noted. Mild edema present in superior breast. ? ?POSTURE:  ?Forward head, rounded shoulders ? ?UPPER EXTREMITY AROM/PROM: ?  ?A/PROM RIGHT  11/05/2021 ?  RIGHT 12/18/2021  ?Shoulder extension 62 44  ?Shoulder flexion 155 140  ?Shoulder abduction 157 147  ?Shoulder internal rotation 65 51  ?Shoulder external rotation 83 77  ?                        (Blank rows = not tested) ?  ?A/PROM LEFT  11/05/2021  ?Shoulder extension 47  ?Shoulder flexion 141  ?Shoulder abduction 143  ?Shoulder internal rotation 64  ?Shoulder external rotation 72  ?                        (Blank rows = not tested) ?  ?   ?CERVICAL AROM: ?All within normal limits ?  ?  ?UPPER EXTREMITY STRENGTH: WNL ?  ?  ?LYMPHEDEMA ASSESSMENTS:  ?  ?LANDMARK RIGHT  11/05/2021 RIGHT 12/18/2021  ?10 cm proximal to olecranon process 27.5 27  ?Olecranon process 24.5 24.6  ?10 cm proximal to ulnar styloid process 21.4 21.2  ?Just proximal to ulnar styloid process 15.7 15.2  ?Across hand at thumb web space 19.3 19.3  ?At base of 2nd digit 6.6 6.4  ?(Blank rows = not tested) ?  ?[place] LEFT  11/05/2021 LEFT 12/18/2021  ?10 cm proximal to olecranon process 28.5 27.8  ?  Olecranon process 24.9 24.3  ?10 cm proximal to ulnar styloid process 20.7 20.8  ?Just proximal to ulnar styloid process 15.2 15.1  ?Across hand at thumb web space 19.1 18.9  ?At base of 2nd digit 6.4 6.3   ?(Blank rows = not tested) ?  ?  ? ? ?  ?Surgery type/Date: Right lumpectomy and sentinel node biopsy 11/25/2021 ?Number of lymph nodes removed: 4 ?Current/past treatment (chemo, radiation, hormone therapy): none ?Other symptoms:  ?Heaviness/tightness No ?Pain No ?Pitting edema No ?Infections No ?Decreased scar mobility No ?Stemmer sign No ? ? ?PATIENT EDUCATION:  ?Education details: Reviewed HEP and issued closed chain flexion exercise ?Person educated: Patient ?Education method: Explanation, Demonstration, and Handouts ?Education comprehension: verbalized understanding and returned demonstration ? ? ?HOME EXERCISE PROGRAM: ? Reviewed previously given post op HEP. ? ? ?ASSESSMENT: ? ?CLINICAL IMPRESSION: ?Patient is doing very well s/p right lumpectomy and sentinel node biopsy on 11/25/2021. She had 4 negative nodes removed. She returned to work 2 days later. She has nearly regained shoulder ROM and function, shows no signs of lymphedema, and her incisions are healing well. She plans to attend the After Breast Cancer class and return every 3 months for SOZO screens but otherwise, has no need for PT. ? ?Pt will benefit from skilled therapeutic intervention to improve on the following deficits: Decreased knowledge of precautions, impaired UE functional use, pain, decreased ROM, postural dysfunction.  ? ?PT treatment/interventions: ADL/Self care home management, Therapeutic exercises and Patient/Family education ? ? ? ? ?GOALS: ?Goals reviewed with patient? Yes ? ?LONG TERM GOALS:  (STG=LTG) ? ?GOALS Name Target Date Goal status  ?1 Pt will demonstrate she has regained full shoulder ROM and function post operatively compared to baselines.  ?Baseline: 12/31/2021 MET  ? ? ? ?PLAN: ?PT FREQUENCY/DURATION: N/A - continue with SOZO screens ? ?PLAN FOR NEXT SESSION: D/C ? ? ?[facility] ? [address], [address] ? [place] [address] ? [phone] ? ?After Breast Cancer Class ?It is recommended you  attend the ABC class to be educated on lymphedema risk reduction. This class is free of charge and lasts for 1 hour. It is a 1-time class. You will need to download the Webex app either on your phone or comp

## 2021-12-18 NOTE — Patient Instructions (Signed)
Closed Chain: Shoulder Flexion / Extension - on Wall ? ? ? ?Hands on wall, step backward. Return. Stepping causes shoulder flexion and extension ?Do _5__ times, holding 5 seconds, _2__ times per day. ? ?http://ss.exer.us/265  ? ?Copyright ? VHI. All rights reserved.  ? ?After Breast Cancer Class ?It is recommended you attend the ABC class to be educated on lymphedema risk reduction. This class is free of charge and lasts for 1 hour. It is a 1-time class. You will need to download the Webex app either on your phone or computer. We will send you a link the night before or the morning of the class. You should be able to click on that link to join the class. This is not a confidential class. You don't have to turn your camera on, but other participants may be able to see your email address. You are scheduled for Jan 19, 2022. ? ?Scar massage ?You can begin gentle scar massage to you incision sites. Gently place one hand on the incision and move the skin (without sliding on the skin) in various directions. Do this for a few minutes and then you can gently massage either coconut oil or vitamin E cream into the scars. ? ?Compression garment ?You should continue wearing your compression bra until you feel like you no longer have swelling. ? ?Home exercise Program ?Continue doing the exercises you were given until you feel like you can do them without feeling any tightness at the end.  ? ?Walking Program ?Studies show that 30 minutes of walking per day (fast enough to elevate your heart rate) can significantly reduce the risk of a cancer recurrence. If you can't walk due to other medical reasons, we encourage you to find another activity you could do (like a stationary bike or water exercise). ? ?Posture ?After breast cancer surgery, people frequently sit with rounded shoulders posture because it puts their incisions on slack and feels better. If you sit like this and scar tissue forms in that position, you can become very  tight and have pain sitting or standing with good posture. Try to be aware of your posture and sit and stand up tall to heal properly. ? ?Follow up PT: ?It is recommended you return every 3 months for the first 3 years following surgery to be assessed on the SOZO machine for an L-Dex score. This helps prevent clinically significant lymphedema in 95% of patients. These follow up screens are 10 minute appointments that you are not billed for. You are scheduled March 02, 2022 at 1:10 PM. ? ?

## 2021-12-21 NOTE — Progress Notes (Signed)
?Radiation Oncology         [phone] ?________________________________ ? ?Name: Carly Miller MRN: [id number]  ?Date: 12/22/2021  DOB: 12-26-1975 ? ?Re-Evaluation Note ? ?CC: Louretta Shorten, MD  Benay Pike, MD ? ?  ICD-10-CM   ?1. Malignant neoplasm of upper-outer quadrant of right breast in female, estrogen receptor positive ([place])  C50.411   ? Z17.0   ?  ? ? ?Diagnosis:  S/p right lumpectomy: Stage IA  (pT1c, pN0) Right Breast UOQ, Invasive ductal carcinoma w/ intermediate grade DCIS, ER+ / PR+ / Her2-, Grade 1 ? ?Narrative:  The patient returns today to discuss radiation treatment options. She was seen in the multidisciplinary breast clinic on 11/05/21.  ? ?Since consultation, she underwent genetic testing on 11/05/21. Results showed no clinically significant variants detected by BRCAplus or +RNAinsight testing. ? ?Pertinent imaging performed since the patient was last seen includes:  ?--Bilateral breast MRI on 11/14/21 which showed the 1.5 cm known cancer in the slightly upper outer right ?Breast, as well as a second suspicious mass in the superior right breast measuring 0.8 cm (not appreciated on prior imaging). No evidence of malignancy was seen within the left breast. ?--Subsequent right breast US on 11/20/21 again demonstrated the 0.8 cm mass at the 12 o'clock position of the right breast. Differentials were noted to include fibroadenoma vs less likely malignancy. ? ?Accordingly, the patient underwent a biopsy of the 12:00 right breast mass on 11/20/21 which showed findings consistent with fibroadenoma and no evidence of malignancy. ? ?She opted to proceed with right breast lumpectomy with SLN biopsies on 11/25/21 under the care of Dr. Brantley Stage. Pathology from the procedure revealed: grade 1 invasive ductal carcinoma measuring 2 cm, with intermediate grade ductal carcinoma in-situ. All margins negative for carcinoma. Nodal status of 4/4 right axillary SLN excisions negative for metastatic carcinoma.  Prognostic indicators significant for: estrogen receptor 100% positive; progesterone receptor 100% positive (both with strong staining intensity); Proliferation marker Ki67 at <5%; Her2 status negative; Grade 1.  ? ?Oncotype DX was obtained on the final surgical sample and the recurrence score of 17 predicts a risk of recurrence outside the breast over the next 9 years of 5%, if the patient's only systemic therapy is an antiestrogen for 5 years.  It also predicts no significant benefit from chemotherapy. ? ?During a post-op follow up with Dr. Brantley Stage on 12/16/21, the patent was noted to be healing well and denied any complaints. She also met with OP rehab on 12/18/21. During which time, the patient denied any pain but reported some numbness to her posterior right arm. The patient will not require PT and will return to rehab every 3 months for SOZO screening.  ? ?The patient has met with Dr. Chryl Heck and will return following XRT to discuss antiestrogen treatment options further. (Patient is leaning towards tamoxifen).  ? ?On review of systems, the patient reports no significant discomfort in the right breast. She denies swelling in her right arm or hand or numbness and any other symptoms.  ? ? ?Allergies:  is allergic to naproxen sodium. ? ?Meds: ?Current Outpatient Medications  ?Medication Sig Dispense Refill  ? Cyanocobalamin (B-12 PO) Take 2,000 mcg by mouth.    ? escitalopram (LEXAPRO) 10 MG tablet Take 10 mg by mouth daily.    ? Multiple Vitamin (MULTIVITAMIN) capsule Take 1 capsule by mouth daily.    ? Red Yeast Rice Extract 600 MG TABS Take by mouth.    ? zolpidem (AMBIEN) 10 MG tablet  zolpidem 10 mg tablet ? TAKE ONE TABLET AT BEDTIME AS NEEDED FOR INSOMNIA    ? oxyCODONE (OXY IR/ROXICODONE) 5 MG immediate release tablet Take 1 tablet (5 mg total) by mouth every 6 (six) hours as needed for severe pain. (Patient not taking: Reported on 12/22/2021) 15 tablet 0  ? ?No current facility-administered medications for  this encounter.  ? ? ?Physical Findings: ?The patient is in no acute distress. Patient is alert and oriented. ? height is 5' 6.5" (1.689 m) and weight is 161 lb (73 kg). Her temperature is 97.6 ?F (36.4 ?C). Her blood pressure is 112/80 and her pulse is 78. Her respiration is 20 and oxygen saturation is 100%.  No significant changes. Lungs are clear to auscultation bilaterally. Heart has regular rate and rhythm. No palpable cervical, supraclavicular, or axillary adenopathy. Abdomen soft, non-tender, normal bowel sounds. ?Left Breast: no palpable mass, nipple discharge or bleeding.  Cosmetic implant in place.   ?Right Breast: Well-healed scar in the upper outer quadrant and axillary region from her sentinel node procedure.  No signs of drainage or infection.  Cosmetic implant in place. ? ?Lab Findings: ?Lab Results  ?Component Value Date  ? WBC 4.6 11/05/2021  ? HGB 13.1 11/05/2021  ? HCT 40.9 11/05/2021  ? MCV 93.6 11/05/2021  ? PLT 389 11/05/2021  ? ? ?Radiographic Findings: ?MM Breast Surgical Specimen ? ?Result Date: 11/25/2021 ?CLINICAL DATA:  Status post lumpectomy today after earlier radioactive seed localization. EXAM: SPECIMEN RADIOGRAPH OF THE RIGHT BREAST COMPARISON:  Previous exam(s). FINDINGS: Status post excision of the right breast. The biopsy marker clip is present and appears intact within the specimen. The radioactive seed is seen within a separate container. Findings discussed with the OR staff during the procedure. IMPRESSION: Specimen radiograph of the right breast. Electronically Signed   By: Franki Cabot M.D.   On: 11/25/2021 10:32 ? ?MM RT RADIOACTIVE SEED LOC MAMMO GUIDE ? ?Result Date: 11/24/2021 ?CLINICAL DATA:  Radioactive seed placement prior to surgery. Biopsy proven invasive ductal carcinoma in the right breast (ribbon shaped clip). EXAM: MAMMOGRAPHIC GUIDED RADIOACTIVE SEED LOCALIZATION OF THE RIGHT BREAST COMPARISON:  Previous exam(s). FINDINGS: Patient presents for radioactive seed  localization prior to surgery. I met with the patient and we discussed the procedure of seed localization including benefits and alternatives. We discussed the high likelihood of a successful procedure. We discussed the risks of the procedure including infection, bleeding, tissue injury and further surgery. We discussed the low dose of radioactivity involved in the procedure. Informed, written consent was given. The usual time-out protocol was performed immediately prior to the procedure. Using mammographic guidance, sterile technique, 1% lidocaine and an I-125 radioactive seed, the ribbon shaped clip was localized using a lateral to medial approach. The follow-up mammogram images confirm the seed in the expected location and were marked for Dr. Brantley Stage. Follow-up survey of the patient confirms presence of the radioactive seed. Order number of I-125 seed:  [id number]. Total activity:  5.697 millicuries reference Date: 10/30/2021 The patient tolerated the procedure well and was released from the [facility]. She was given instructions regarding seed removal. IMPRESSION: Radioactive seed localization right breast. No apparent complications. Electronically Signed   By: Lillia Mountain M.D.   On: 11/24/2021 14:42  ? ?Impression:   ?S/p right lumpectomy: Stage IA  (pT1c, pN0) Right Breast UOQ, Invasive ductal carcinoma w/ intermediate grade DCIS, ER+ / PR+ / Her2-, Grade 1 ?She would be a good candidate for adjuvant radiation therapy.  We  discussed the course of treatment anticipated side effects and potential long-term toxicities.  She is aware of potential capsular fibrosis related to her underlying cosmetic implant.  We discussed the implications of this implant in detail.  She would not be a candidate for hypofractionated accelerated radiation therapy in light of her cosmetic implant in place. ? ?Plan:  Patient is scheduled for CT simulation later today.  Anticipate treatment starting in approximately 1 week.   Anticipate 6 and half weeks of radiation therapy. ? ?----------------------------------- ? ?Blair Promise, PhD, MD ? ?This document serves as a record of services personally performed by Gery Pray, MD. It was create

## 2021-12-22 ENCOUNTER — Encounter: Payer: Self-pay | Admitting: Radiation Oncology

## 2021-12-22 ENCOUNTER — Ambulatory Visit
Admission: RE | Admit: 2021-12-22 | Discharge: 2021-12-22 | Disposition: A | Discharge status: Home / Self Care | Payer: BC Managed Care – PPO | Source: Ambulatory Visit | Attending: Radiation Oncology | Admitting: Radiation Oncology

## 2021-12-22 ENCOUNTER — Other Ambulatory Visit: Payer: Self-pay

## 2021-12-22 VITALS — BP 112/80 | HR 78 | Temp 97.6°F | Resp 20 | Ht 66.5 in | Wt 161.0 lb

## 2021-12-22 DIAGNOSIS — R200 Anesthesia of skin: Secondary | ICD-10-CM | POA: Insufficient documentation

## 2021-12-22 DIAGNOSIS — Z510 Encounter for antineoplastic radiation therapy: Secondary | ICD-10-CM | POA: Diagnosis not present

## 2021-12-22 DIAGNOSIS — Z79899 Other long term (current) drug therapy: Secondary | ICD-10-CM | POA: Insufficient documentation

## 2021-12-22 DIAGNOSIS — Z170 Estrogen receptor positive status [ER+]: Secondary | ICD-10-CM

## 2021-12-22 DIAGNOSIS — C50411 Malignant neoplasm of upper-outer quadrant of right female breast: Secondary | ICD-10-CM | POA: Diagnosis present

## 2021-12-22 DIAGNOSIS — N739 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, unspecified: Secondary | ICD-10-CM | POA: Insufficient documentation

## 2021-12-22 NOTE — Progress Notes (Signed)
See MD note for nursing evaluation. °

## 2021-12-24 ENCOUNTER — Encounter (HOSPITAL_COMMUNITY): Payer: Self-pay

## 2021-12-25 DIAGNOSIS — Z510 Encounter for antineoplastic radiation therapy: Secondary | ICD-10-CM | POA: Diagnosis not present

## 2021-12-29 ENCOUNTER — Other Ambulatory Visit: Payer: Self-pay

## 2021-12-29 ENCOUNTER — Encounter: Payer: Self-pay | Admitting: *Deleted

## 2021-12-29 ENCOUNTER — Ambulatory Visit
Admission: RE | Admit: 2021-12-29 | Discharge: 2021-12-29 | Disposition: A | Discharge status: Home / Self Care | Payer: BC Managed Care – PPO | Source: Ambulatory Visit | Attending: Radiation Oncology | Admitting: Radiation Oncology

## 2021-12-29 DIAGNOSIS — Z170 Estrogen receptor positive status [ER+]: Secondary | ICD-10-CM

## 2021-12-29 DIAGNOSIS — Z510 Encounter for antineoplastic radiation therapy: Secondary | ICD-10-CM | POA: Diagnosis not present

## 2021-12-30 ENCOUNTER — Ambulatory Visit
Admission: RE | Admit: 2021-12-30 | Discharge: 2021-12-30 | Disposition: A | Discharge status: Home / Self Care | Payer: BC Managed Care – PPO | Source: Ambulatory Visit | Attending: Radiation Oncology | Admitting: Radiation Oncology

## 2021-12-30 DIAGNOSIS — C50411 Malignant neoplasm of upper-outer quadrant of right female breast: Secondary | ICD-10-CM

## 2021-12-30 DIAGNOSIS — Z510 Encounter for antineoplastic radiation therapy: Secondary | ICD-10-CM | POA: Diagnosis not present

## 2021-12-30 MED ORDER — RADIAPLEXRX EX GEL
1.0000 "application " | Freq: Once | CUTANEOUS | Status: AC
  Administered 2021-12-30: 1 via TOPICAL

## 2021-12-31 ENCOUNTER — Ambulatory Visit
Admission: RE | Admit: 2021-12-31 | Discharge: 2021-12-31 | Disposition: A | Discharge status: Home / Self Care | Payer: BC Managed Care – PPO | Source: Ambulatory Visit | Attending: Radiation Oncology | Admitting: Radiation Oncology

## 2021-12-31 ENCOUNTER — Other Ambulatory Visit: Payer: Self-pay

## 2021-12-31 DIAGNOSIS — Z510 Encounter for antineoplastic radiation therapy: Secondary | ICD-10-CM | POA: Diagnosis not present

## 2022-01-01 ENCOUNTER — Ambulatory Visit
Admission: RE | Admit: 2022-01-01 | Discharge: 2022-01-01 | Disposition: A | Discharge status: Home / Self Care | Payer: BC Managed Care – PPO | Source: Ambulatory Visit | Attending: Radiation Oncology | Admitting: Radiation Oncology

## 2022-01-01 DIAGNOSIS — Z510 Encounter for antineoplastic radiation therapy: Secondary | ICD-10-CM | POA: Diagnosis not present

## 2022-01-02 ENCOUNTER — Ambulatory Visit
Admission: RE | Admit: 2022-01-02 | Discharge: 2022-01-02 | Disposition: A | Discharge status: Home / Self Care | Payer: BC Managed Care – PPO | Source: Ambulatory Visit | Attending: Radiation Oncology | Admitting: Radiation Oncology

## 2022-01-02 ENCOUNTER — Other Ambulatory Visit: Payer: Self-pay

## 2022-01-02 DIAGNOSIS — Z510 Encounter for antineoplastic radiation therapy: Secondary | ICD-10-CM | POA: Diagnosis not present

## 2022-01-05 ENCOUNTER — Encounter: Payer: Self-pay | Admitting: Genetic Counselor

## 2022-01-05 ENCOUNTER — Other Ambulatory Visit: Payer: Self-pay

## 2022-01-05 ENCOUNTER — Ambulatory Visit
Admission: RE | Admit: 2022-01-05 | Discharge: 2022-01-05 | Disposition: A | Discharge status: Home / Self Care | Payer: BC Managed Care – PPO | Source: Ambulatory Visit | Attending: Radiation Oncology | Admitting: Radiation Oncology

## 2022-01-05 DIAGNOSIS — Z510 Encounter for antineoplastic radiation therapy: Secondary | ICD-10-CM | POA: Diagnosis not present

## 2022-01-06 ENCOUNTER — Ambulatory Visit
Admission: RE | Admit: 2022-01-06 | Discharge: 2022-01-06 | Disposition: A | Discharge status: Home / Self Care | Payer: BC Managed Care – PPO | Source: Ambulatory Visit | Attending: Radiation Oncology | Admitting: Radiation Oncology

## 2022-01-06 ENCOUNTER — Other Ambulatory Visit: Payer: Self-pay

## 2022-01-06 DIAGNOSIS — Z510 Encounter for antineoplastic radiation therapy: Secondary | ICD-10-CM | POA: Diagnosis not present

## 2022-01-06 LAB — RAD ONC ARIA SESSION SUMMARY
Course Elapsed Days: 8
Plan Fractions Treated to Date: 7
Plan Prescribed Dose Per Fraction: 1.8 Gy
Plan Total Fractions Prescribed: 28
Plan Total Prescribed Dose: 50.4 Gy
Reference Point Dosage Given to Date: 12.6 Gy
Reference Point Session Dosage Given: 1.8 Gy
Session Number: 7

## 2022-01-07 ENCOUNTER — Other Ambulatory Visit: Payer: Self-pay

## 2022-01-07 ENCOUNTER — Ambulatory Visit: Admission: RE | Admit: 2022-01-07 | Payer: BC Managed Care – PPO | Source: Ambulatory Visit

## 2022-01-08 ENCOUNTER — Other Ambulatory Visit: Payer: Self-pay

## 2022-01-08 ENCOUNTER — Ambulatory Visit
Admission: RE | Admit: 2022-01-08 | Discharge: 2022-01-08 | Disposition: A | Discharge status: Home / Self Care | Payer: BC Managed Care – PPO | Source: Ambulatory Visit | Attending: Radiation Oncology | Admitting: Radiation Oncology

## 2022-01-08 DIAGNOSIS — Z510 Encounter for antineoplastic radiation therapy: Secondary | ICD-10-CM | POA: Diagnosis not present

## 2022-01-08 LAB — RAD ONC ARIA SESSION SUMMARY
Course Elapsed Days: 10
Plan Fractions Treated to Date: 8
Plan Prescribed Dose Per Fraction: 1.8 Gy
Plan Total Fractions Prescribed: 28
Plan Total Prescribed Dose: 50.4 Gy
Reference Point Dosage Given to Date: 14.4 Gy
Reference Point Session Dosage Given: 1.8 Gy
Session Number: 8

## 2022-01-09 ENCOUNTER — Other Ambulatory Visit: Payer: Self-pay

## 2022-01-09 ENCOUNTER — Ambulatory Visit
Admission: RE | Admit: 2022-01-09 | Discharge: 2022-01-09 | Disposition: A | Discharge status: Home / Self Care | Payer: BC Managed Care – PPO | Source: Ambulatory Visit | Attending: Radiation Oncology | Admitting: Radiation Oncology

## 2022-01-09 DIAGNOSIS — Z510 Encounter for antineoplastic radiation therapy: Secondary | ICD-10-CM | POA: Diagnosis not present

## 2022-01-09 LAB — RAD ONC ARIA SESSION SUMMARY
Course Elapsed Days: 11
Plan Fractions Treated to Date: 9
Plan Prescribed Dose Per Fraction: 1.8 Gy
Plan Total Fractions Prescribed: 28
Plan Total Prescribed Dose: 50.4 Gy
Reference Point Dosage Given to Date: 16.2 Gy
Reference Point Session Dosage Given: 1.8 Gy
Session Number: 9

## 2022-01-12 ENCOUNTER — Other Ambulatory Visit: Payer: Self-pay

## 2022-01-12 ENCOUNTER — Ambulatory Visit
Admission: RE | Admit: 2022-01-12 | Discharge: 2022-01-12 | Disposition: A | Discharge status: Home / Self Care | Payer: BC Managed Care – PPO | Source: Ambulatory Visit | Attending: Radiation Oncology | Admitting: Radiation Oncology

## 2022-01-12 DIAGNOSIS — Z510 Encounter for antineoplastic radiation therapy: Secondary | ICD-10-CM | POA: Diagnosis not present

## 2022-01-12 LAB — RAD ONC ARIA SESSION SUMMARY
Course Elapsed Days: 14
Plan Fractions Treated to Date: 10
Plan Prescribed Dose Per Fraction: 1.8 Gy
Plan Total Fractions Prescribed: 28
Plan Total Prescribed Dose: 50.4 Gy
Reference Point Dosage Given to Date: 18 Gy
Reference Point Session Dosage Given: 1.8 Gy
Session Number: 10

## 2022-01-13 ENCOUNTER — Ambulatory Visit
Admission: RE | Admit: 2022-01-13 | Discharge: 2022-01-13 | Disposition: A | Discharge status: Home / Self Care | Payer: BC Managed Care – PPO | Source: Ambulatory Visit | Attending: Radiation Oncology | Admitting: Radiation Oncology

## 2022-01-13 ENCOUNTER — Other Ambulatory Visit: Payer: Self-pay

## 2022-01-13 DIAGNOSIS — Z510 Encounter for antineoplastic radiation therapy: Secondary | ICD-10-CM | POA: Diagnosis not present

## 2022-01-13 LAB — RAD ONC ARIA SESSION SUMMARY
Course Elapsed Days: 15
Plan Fractions Treated to Date: 11
Plan Prescribed Dose Per Fraction: 1.8 Gy
Plan Total Fractions Prescribed: 28
Plan Total Prescribed Dose: 50.4 Gy
Reference Point Dosage Given to Date: 19.8 Gy
Reference Point Session Dosage Given: 1.8 Gy
Session Number: 11

## 2022-01-14 ENCOUNTER — Other Ambulatory Visit: Payer: Self-pay

## 2022-01-14 ENCOUNTER — Ambulatory Visit
Admission: RE | Admit: 2022-01-14 | Discharge: 2022-01-14 | Disposition: A | Discharge status: Home / Self Care | Payer: BC Managed Care – PPO | Source: Ambulatory Visit | Attending: Radiation Oncology | Admitting: Radiation Oncology

## 2022-01-14 DIAGNOSIS — Z510 Encounter for antineoplastic radiation therapy: Secondary | ICD-10-CM | POA: Diagnosis not present

## 2022-01-14 LAB — RAD ONC ARIA SESSION SUMMARY
Course Elapsed Days: 16
Plan Fractions Treated to Date: 12
Plan Prescribed Dose Per Fraction: 1.8 Gy
Plan Total Fractions Prescribed: 28
Plan Total Prescribed Dose: 50.4 Gy
Reference Point Dosage Given to Date: 21.6 Gy
Reference Point Session Dosage Given: 1.8 Gy
Session Number: 12

## 2022-01-15 ENCOUNTER — Ambulatory Visit
Admission: RE | Admit: 2022-01-15 | Discharge: 2022-01-15 | Disposition: A | Discharge status: Home / Self Care | Payer: BC Managed Care – PPO | Source: Ambulatory Visit | Attending: Radiation Oncology | Admitting: Radiation Oncology

## 2022-01-15 ENCOUNTER — Other Ambulatory Visit: Payer: Self-pay

## 2022-01-15 DIAGNOSIS — Z510 Encounter for antineoplastic radiation therapy: Secondary | ICD-10-CM | POA: Diagnosis not present

## 2022-01-15 LAB — RAD ONC ARIA SESSION SUMMARY
Course Elapsed Days: 17
Plan Fractions Treated to Date: 13
Plan Prescribed Dose Per Fraction: 1.8 Gy
Plan Total Fractions Prescribed: 28
Plan Total Prescribed Dose: 50.4 Gy
Reference Point Dosage Given to Date: 23.4 Gy
Reference Point Session Dosage Given: 1.8 Gy
Session Number: 13

## 2022-01-16 ENCOUNTER — Other Ambulatory Visit: Payer: Self-pay

## 2022-01-16 ENCOUNTER — Ambulatory Visit
Admission: RE | Admit: 2022-01-16 | Discharge: 2022-01-16 | Disposition: A | Discharge status: Home / Self Care | Payer: BC Managed Care – PPO | Source: Ambulatory Visit | Attending: Radiation Oncology | Admitting: Radiation Oncology

## 2022-01-16 DIAGNOSIS — Z510 Encounter for antineoplastic radiation therapy: Secondary | ICD-10-CM | POA: Diagnosis not present

## 2022-01-16 LAB — RAD ONC ARIA SESSION SUMMARY
Course Elapsed Days: 18
Plan Fractions Treated to Date: 14
Plan Prescribed Dose Per Fraction: 1.8 Gy
Plan Total Fractions Prescribed: 28
Plan Total Prescribed Dose: 50.4 Gy
Reference Point Dosage Given to Date: 25.2 Gy
Reference Point Session Dosage Given: 1.8 Gy
Session Number: 14

## 2022-01-19 ENCOUNTER — Ambulatory Visit
Admission: RE | Admit: 2022-01-19 | Discharge: 2022-01-19 | Disposition: A | Discharge status: Home / Self Care | Payer: BC Managed Care – PPO | Source: Ambulatory Visit | Attending: Radiation Oncology | Admitting: Radiation Oncology

## 2022-01-19 ENCOUNTER — Other Ambulatory Visit: Payer: Self-pay

## 2022-01-19 DIAGNOSIS — Z170 Estrogen receptor positive status [ER+]: Secondary | ICD-10-CM | POA: Diagnosis not present

## 2022-01-19 DIAGNOSIS — Z510 Encounter for antineoplastic radiation therapy: Secondary | ICD-10-CM | POA: Diagnosis present

## 2022-01-19 DIAGNOSIS — C50411 Malignant neoplasm of upper-outer quadrant of right female breast: Secondary | ICD-10-CM | POA: Insufficient documentation

## 2022-01-19 LAB — RAD ONC ARIA SESSION SUMMARY
Course Elapsed Days: 21
Plan Fractions Treated to Date: 15
Plan Prescribed Dose Per Fraction: 1.8 Gy
Plan Total Fractions Prescribed: 28
Plan Total Prescribed Dose: 50.4 Gy
Reference Point Dosage Given to Date: 27 Gy
Reference Point Session Dosage Given: 1.8 Gy
Session Number: 15

## 2022-01-20 ENCOUNTER — Other Ambulatory Visit: Payer: Self-pay

## 2022-01-20 ENCOUNTER — Ambulatory Visit
Admission: RE | Admit: 2022-01-20 | Discharge: 2022-01-20 | Disposition: A | Discharge status: Home / Self Care | Payer: BC Managed Care – PPO | Source: Ambulatory Visit | Attending: Radiation Oncology | Admitting: Radiation Oncology

## 2022-01-20 DIAGNOSIS — Z510 Encounter for antineoplastic radiation therapy: Secondary | ICD-10-CM | POA: Diagnosis not present

## 2022-01-20 LAB — RAD ONC ARIA SESSION SUMMARY
Course Elapsed Days: 22
Plan Fractions Treated to Date: 16
Plan Prescribed Dose Per Fraction: 1.8 Gy
Plan Total Fractions Prescribed: 28
Plan Total Prescribed Dose: 50.4 Gy
Reference Point Dosage Given to Date: 28.8 Gy
Reference Point Session Dosage Given: 1.8 Gy
Session Number: 16

## 2022-01-21 ENCOUNTER — Ambulatory Visit
Admission: RE | Admit: 2022-01-21 | Discharge: 2022-01-21 | Disposition: A | Discharge status: Home / Self Care | Payer: BC Managed Care – PPO | Source: Ambulatory Visit | Attending: Radiation Oncology | Admitting: Radiation Oncology

## 2022-01-21 ENCOUNTER — Other Ambulatory Visit: Payer: Self-pay

## 2022-01-21 DIAGNOSIS — Z510 Encounter for antineoplastic radiation therapy: Secondary | ICD-10-CM | POA: Diagnosis not present

## 2022-01-21 LAB — RAD ONC ARIA SESSION SUMMARY
Course Elapsed Days: 23
Plan Fractions Treated to Date: 17
Plan Prescribed Dose Per Fraction: 1.8 Gy
Plan Total Fractions Prescribed: 28
Plan Total Prescribed Dose: 50.4 Gy
Reference Point Dosage Given to Date: 30.6 Gy
Reference Point Session Dosage Given: 1.8 Gy
Session Number: 17

## 2022-01-22 ENCOUNTER — Other Ambulatory Visit: Payer: Self-pay

## 2022-01-22 ENCOUNTER — Ambulatory Visit
Admission: RE | Admit: 2022-01-22 | Discharge: 2022-01-22 | Disposition: A | Discharge status: Home / Self Care | Payer: BC Managed Care – PPO | Source: Ambulatory Visit | Attending: Radiation Oncology | Admitting: Radiation Oncology

## 2022-01-22 DIAGNOSIS — Z510 Encounter for antineoplastic radiation therapy: Secondary | ICD-10-CM | POA: Diagnosis not present

## 2022-01-22 LAB — RAD ONC ARIA SESSION SUMMARY
Course Elapsed Days: 24
Plan Fractions Treated to Date: 18
Plan Prescribed Dose Per Fraction: 1.8 Gy
Plan Total Fractions Prescribed: 28
Plan Total Prescribed Dose: 50.4 Gy
Reference Point Dosage Given to Date: 32.4 Gy
Reference Point Session Dosage Given: 1.8 Gy
Session Number: 18

## 2022-01-23 ENCOUNTER — Ambulatory Visit
Admission: RE | Admit: 2022-01-23 | Discharge: 2022-01-23 | Disposition: A | Discharge status: Home / Self Care | Payer: BC Managed Care – PPO | Source: Ambulatory Visit | Attending: Radiation Oncology | Admitting: Radiation Oncology

## 2022-01-23 ENCOUNTER — Other Ambulatory Visit: Payer: Self-pay

## 2022-01-23 DIAGNOSIS — Z510 Encounter for antineoplastic radiation therapy: Secondary | ICD-10-CM | POA: Diagnosis not present

## 2022-01-23 LAB — RAD ONC ARIA SESSION SUMMARY
Course Elapsed Days: 25
Plan Fractions Treated to Date: 19
Plan Prescribed Dose Per Fraction: 1.8 Gy
Plan Total Fractions Prescribed: 28
Plan Total Prescribed Dose: 50.4 Gy
Reference Point Dosage Given to Date: 34.2 Gy
Reference Point Session Dosage Given: 1.8 Gy
Session Number: 19

## 2022-01-26 ENCOUNTER — Other Ambulatory Visit: Payer: Self-pay

## 2022-01-26 ENCOUNTER — Ambulatory Visit
Admission: RE | Admit: 2022-01-26 | Discharge: 2022-01-26 | Disposition: A | Discharge status: Home / Self Care | Payer: BC Managed Care – PPO | Source: Ambulatory Visit | Attending: Radiation Oncology | Admitting: Radiation Oncology

## 2022-01-26 DIAGNOSIS — Z510 Encounter for antineoplastic radiation therapy: Secondary | ICD-10-CM | POA: Diagnosis not present

## 2022-01-26 LAB — RAD ONC ARIA SESSION SUMMARY
Course Elapsed Days: 28
Plan Fractions Treated to Date: 20
Plan Prescribed Dose Per Fraction: 1.8 Gy
Plan Total Fractions Prescribed: 28
Plan Total Prescribed Dose: 50.4 Gy
Reference Point Dosage Given to Date: 36 Gy
Reference Point Session Dosage Given: 1.8 Gy
Session Number: 20

## 2022-01-27 ENCOUNTER — Encounter: Payer: Self-pay | Admitting: Hematology and Oncology

## 2022-01-27 ENCOUNTER — Inpatient Hospital Stay: Payer: BC Managed Care – PPO | Admitting: Hematology and Oncology

## 2022-01-27 ENCOUNTER — Ambulatory Visit
Admission: RE | Admit: 2022-01-27 | Discharge: 2022-01-27 | Disposition: A | Discharge status: Home / Self Care | Payer: BC Managed Care – PPO | Source: Ambulatory Visit | Attending: Radiation Oncology | Admitting: Radiation Oncology

## 2022-01-27 ENCOUNTER — Other Ambulatory Visit: Payer: Self-pay

## 2022-01-27 DIAGNOSIS — C50411 Malignant neoplasm of upper-outer quadrant of right female breast: Secondary | ICD-10-CM | POA: Insufficient documentation

## 2022-01-27 DIAGNOSIS — Z170 Estrogen receptor positive status [ER+]: Secondary | ICD-10-CM | POA: Diagnosis not present

## 2022-01-27 DIAGNOSIS — Z510 Encounter for antineoplastic radiation therapy: Secondary | ICD-10-CM | POA: Diagnosis not present

## 2022-01-27 LAB — RAD ONC ARIA SESSION SUMMARY
Course Elapsed Days: 29
Plan Fractions Treated to Date: 21
Plan Prescribed Dose Per Fraction: 1.8 Gy
Plan Total Fractions Prescribed: 28
Plan Total Prescribed Dose: 50.4 Gy
Reference Point Dosage Given to Date: 37.8 Gy
Reference Point Session Dosage Given: 1.8 Gy
Session Number: 21

## 2022-01-27 MED ORDER — TAMOXIFEN CITRATE 20 MG PO TABS: 20.0000 mg | ORAL_TABLET | Freq: Every day | ORAL | 1 refills | Status: DC

## 2022-01-27 NOTE — Progress Notes (Signed)
[facility] ?CONSULT NOTE ? ?Patient Care Team: ?Louretta Shorten, MD as PCP - General (Obstetrics and Gynecology) ?Rockwell Germany, RN as Oncology Nurse Navigator ?Mauro Kaufmann, RN as Oncology Nurse Navigator ?Erroll Luna, MD as Consulting Physician (General Surgery) ?Benay Pike, MD as Consulting Physician (Hematology and Oncology) ?Gery Pray, MD as Consulting Physician (Radiation Oncology) ? ?CHIEF COMPLAINTS/PURPOSE OF CONSULTATION:  ?Newly diagnosed breast cancer ? ?HISTORY OF PRESENTING ILLNESS:  ?Carly Miller 46 y.o. female is here because of recent diagnosis of right breast cancer ? ? ?SUMMARY OF ONCOLOGIC HISTORY: ?Oncology History  ?Malignant neoplasm of upper-outer quadrant of right breast in female, estrogen receptor positive ([place])  ?10/10/2021 Mammogram  ? Indeterminate right breast mass at the 11 o'clock position corresponding to the patient, recommend ultrasound-guided biopsy of the, no suspicious right axillary lymphadenopathy. ?  ?Targeted ultrasound is performed, showing an irregular, hypoechoic ?mass with associated vascularity at the 11 o'clock position 2 cm ?from the nipple. It measures 1.9 x 1.6 x 1.0 cm. This corresponds ?with the patient's palpable lump. Evaluation of the right axilla ?demonstrates no suspicious lymphadenopathy. ?  ?10/31/2021 Initial Diagnosis  ? Malignant neoplasm of upper-outer quadrant of right breast in female, estrogen receptor positive ([place]) ?  ?11/05/2021 Cancer Staging  ? Staging form: Breast, AJCC 8th Edition ?- Clinical: Stage IA (cT1c, cN0, cM0, G2, ER+, PR+, HER2-) - Signed by Benay Pike, MD on 11/05/2021 ?Stage prefix: Initial diagnosis ?Histologic grading system: 3 grade system ? ?  ? Genetic Testing  ? [facility] CancerNext-Expanded Panel was Negative. Report date is 11/17/2021. ? ?The CancerNext-Expanded gene panel offered by [facility] and includes sequencing, rearrangement, and RNA analysis for the following 77 genes: AIP, ALK, APC,  ATM, AXIN2, BAP1, BARD1, BLM, BMPR1A, BRCA1, BRCA2, BRIP1, CDC73, CDH1, CDK4, CDKN1B, CDKN2A, CHEK2, CTNNA1, DICER1, FANCC, FH, FLCN, GALNT12, KIF1B, LZTR1, MAX, MEN1, MET, MLH1, MSH2, MSH3, MSH6, MUTYH, NBN, NF1, NF2, NTHL1, PALB2, PHOX2B, PMS2, POT1, PRKAR1A, PTCH1, PTEN, RAD51C, RAD51D, RB1, RECQL, RET, SDHA, SDHAF2, SDHB, SDHC, SDHD, SMAD4, SMARCA4, SMARCB1, SMARCE1, STK11, SUFU, TMEM127, TP53, TSC1, TSC2, VHL and XRCC2 (sequencing and deletion/duplication); EGFR, EGLN1, HOXB13, KIT, MITF, PDGFRA, POLD1, and POLE (sequencing only); EPCAM and GREM1 (deletion/duplication only).  ?  ? Pathology Results  ?  ?Biopsy of the area showed IDC, grade 1-2, ER positive 100% moderate staining, PR positive 100% strong staining, Her 2 neg. Ki 5%. ?  ?11/25/2021 Surgery  ? Right breast lumpectomy showed invasive ductal carcinoma, 2 cm, grade 1, intermediate grade DCIS, 4 out of 4 sentinel lymph nodes without any micrometastasis, prior prognostics showed ER 100% positive strong staining, PR 100% positive strong staining, HER2 negative, Ki-67 less than 5% ?  ? Oncotype testing  ?   ?Oncotype DX was obtained on the final surgical sample and the recurrence score of 17 predicts a risk of recurrence outside the breast over the next 9 years of 5%, if the patient's only systemic therapy is an antiestrogen for 5 years.  It also predicts no significant benefit from chemotherapy. ?  ? ?Patient is here for follow-up.  She is tolerating radiation very well.  She has about 2 more weeks of adjuvant radiation.  No new complaints.  Rest of the pertinent 10 point ROS reviewed and negative ? ? ?MEDICAL HISTORY:  ?Past Medical History:  ?Diagnosis Date  ? Breast cancer ([place])   ? Endometriosis   ? Hematuria   ? Hyperlipidemia   ? Mass of ovary   ? RIGHT  ?  Pelvic pain in female   ? Urgency of urination   ? UTI (lower urinary tract infection)   ? ? ?SURGICAL HISTORY: ?Past Surgical History:  ?Procedure Laterality Date  ? ABDOMINAL HYSTERECTOMY N/A  08/31/2013  ? Procedure: HYSTERECTOMY ABDOMINAL, LYSIS OF ADHESIONS;  Surgeon: Luz Lex, MD;  Location: [facility];  Service: Gynecology;  Laterality: N/A;  ? ABSCESS DRAINAGE  09/22/1999  ? RECTAL  ? ANAL SPHINCTEROTOMY  09/21/1998  ? BREAST BIOPSY Right 10/27/2021  ? BREAST LUMPECTOMY WITH RADIOACTIVE SEED AND SENTINEL LYMPH NODE BIOPSY Right 11/25/2021  ? Procedure: RIGHT BREAST LUMPECTOMY WITH RADIOACTIVE SEED AND SENTINEL LYMPH NODE BIOPSY;  Surgeon: Erroll Luna, MD;  Location: [facility];  Service: General;  Laterality: Right;  90 MINUTES ROOM 8  ? DIAGNOSTIC LAPAROSCOPY LEFT OOPHORECTOMY  08/31/2007  ? ENDOMETRIOMA  ? LAPAROSCOPIC CHOLECYSTECTOMY  06/21/2012  ? LAPAROSCOPY N/A 08/31/2013  ? Procedure: LAPAROSCOPY DIAGNOSTIC;  Surgeon: Luz Lex, MD;  Location: [facility];  Service: Gynecology;  Laterality: N/A;  ? ? ?SOCIAL HISTORY: ?Social History  ? ?Socioeconomic History  ? Marital status: Married  ?  Spouse name: Not on file  ? Number of children: Not on file  ? Years of education: Not on file  ? Highest education level: Not on file  ?Occupational History  ? Not on file  ?Tobacco Use  ? Smoking status: Never  ? Smokeless tobacco: Never  ?Vaping Use  ? Vaping Use: Never used  ?Substance and Sexual Activity  ? Alcohol use: No  ? Drug use: No  ? Sexual activity: Not on file  ?Other Topics Concern  ? Not on file  ?Social History Narrative  ? Not on file  ? ?Social Determinants of Health  ? ?Financial Resource Strain: Low Risk   ? Difficulty of Paying Living Expenses: Not hard at all  ?Food Insecurity: No Food Insecurity  ? Worried About Charity fundraiser in the Last Year: Never true  ? Ran Out of Food in the Last Year: Never true  ?Transportation Needs: No Transportation Needs  ? Lack of Transportation (Medical): No  ? Lack of Transportation (Non-Medical): No  ?Physical Activity: Not on file  ?Stress: Not on file  ?Social Connections: Not on file   ?Intimate Partner Violence: Not on file  ? ? ?FAMILY HISTORY: ?Family History  ?Problem Relation Age of Onset  ? Asthma Mother   ? Thyroid disease Mother   ? Asthma Father   ? Diabetes Father   ? Thyroid disease Father   ? Kidney disease Sister   ? Thyroid disease Maternal Aunt   ? Thyroid disease Maternal Uncle   ? Leukemia Maternal Grandmother   ?     dx. 60s  ? Breast cancer Neg Hx   ? ? ?ALLERGIES:  is allergic to naproxen sodium. ? ?MEDICATIONS:  ?Current Outpatient Medications  ?Medication Sig Dispense Refill  ? rosuvastatin (CRESTOR) 10 MG tablet Take 10 mg by mouth daily.    ? tamoxifen (NOLVADEX) 20 MG tablet Take 1 tablet (20 mg total) by mouth daily. 90 tablet 1  ? escitalopram (LEXAPRO) 10 MG tablet Take 10 mg by mouth daily.    ? Multiple Vitamin (MULTIVITAMIN) capsule Take 1 capsule by mouth daily.    ? Red Yeast Rice Extract 600 MG TABS Take by mouth.    ? zolpidem (AMBIEN) 10 MG tablet zolpidem 10 mg tablet ? TAKE ONE TABLET AT BEDTIME AS NEEDED FOR INSOMNIA    ? ?  No current facility-administered medications for this visit.  ? ? ? ?PHYSICAL EXAMINATION: ? ?ECOG PERFORMANCE STATUS: 0 - Asymptomatic ? ?Vitals:  ? 01/27/22 1345  ?BP: 109/77  ?Pulse: 78  ?Resp: 16  ?Temp: (!) 97.5 ?F (36.4 ?C)  ?SpO2: 100%  ? ? ?Filed Weights  ? 01/27/22 1345  ?Weight: 161 lb 14.4 oz (73.4 kg)  ? ? ?Physical exam deferred today in lieu of counseling ?LABORATORY DATA:  ?I have reviewed the data as listed ?Lab Results  ?Component Value Date  ? WBC 4.6 11/05/2021  ? HGB 13.1 11/05/2021  ? HCT 40.9 11/05/2021  ? MCV 93.6 11/05/2021  ? PLT 389 11/05/2021  ? ?Lab Results  ?Component Value Date  ? NA 139 11/05/2021  ? K 3.9 11/05/2021  ? CL 105 11/05/2021  ? CO2 30 11/05/2021  ? ? ?RADIOGRAPHIC STUDIES: ?I have personally reviewed the radiological reports and agreed with the findings in the report. ? ?ASSESSMENT AND PLAN:  ? ?Malignant neoplasm of upper-outer quadrant of right breast in female, estrogen receptor positive  ([place]) ?This is a very pleasant 46 yr old premenopausal female patient with T1cN0M0 Grade 1-2 ER PR strongly positive Her 2 negative IDC as well as grade 2 DCIS referred to Breast [place] for consideration of adjuvant

## 2022-01-27 NOTE — Assessment & Plan Note (Signed)
This is a very pleasant 46 yr old premenopausal female patient with T1cN0M0 Grade 1-2 ER PR strongly positive Her 2 negative IDC as well as grade 2 DCIS referred to Breast [place] for consideration of adjuvant therapy. ?Given small tumor and strongly ER PR positive and Her 2 neg, we discussed with upfront surgery followed by oncotype testing. ?She had right breast lumpectomy with axillary sentinel lymph node mapping.  Pathology from lumpectomy showed invasive ductal carcinoma, grade 1 measuring 2 cm, DCIS, intermediate grade, 4 out of 4 sentinel lymph nodes negative.  Prognostic showed ER 100% positive strong staining intensity, PR 100% positive strong staining intensity, HER2 negative and Ki-67 of 5%. ?Oncotype score of 17, no added benefit of chemotherapy.  She is now undergoing adjuvant radiation.  We have once again today discussed about tamoxifen versus aromatase inhibitors.  We have discussed about mechanism of action of tamoxifen, side effects with tamoxifen including but not limited to postmenopausal symptoms, small risk of DVT/PE, improvement in bone density.  She has had hysterectomy has endometrial changes from tamoxifen will not be an issue for her.  We also discussed about aromatase inhibitors, mechanism of action, adverse effects including but not limited to postmenopausal symptoms, vaginal dryness, arthralgias, worsening bone density.  She understands that aromatase inhibitors are slightly superior to tamoxifen in efficacy.  She however would like to proceed with tamoxifen because it has long-term data. ?Tamoxifen prescription has been sent to the pharmacy of choice.  She was recommended to call us with any questions.  We will otherwise plan to see her in about 3 to 4 months. ? ? ?

## 2022-01-28 ENCOUNTER — Other Ambulatory Visit: Payer: Self-pay

## 2022-01-28 ENCOUNTER — Ambulatory Visit
Admission: RE | Admit: 2022-01-28 | Discharge: 2022-01-28 | Disposition: A | Discharge status: Home / Self Care | Payer: BC Managed Care – PPO | Source: Ambulatory Visit | Attending: Radiation Oncology | Admitting: Radiation Oncology

## 2022-01-28 DIAGNOSIS — Z510 Encounter for antineoplastic radiation therapy: Secondary | ICD-10-CM | POA: Diagnosis not present

## 2022-01-28 LAB — RAD ONC ARIA SESSION SUMMARY
Course Elapsed Days: 30
Plan Fractions Treated to Date: 22
Plan Prescribed Dose Per Fraction: 1.8 Gy
Plan Total Fractions Prescribed: 28
Plan Total Prescribed Dose: 50.4 Gy
Reference Point Dosage Given to Date: 39.6 Gy
Reference Point Session Dosage Given: 1.8 Gy
Session Number: 22

## 2022-01-29 ENCOUNTER — Other Ambulatory Visit: Payer: Self-pay

## 2022-01-29 ENCOUNTER — Ambulatory Visit
Admission: RE | Admit: 2022-01-29 | Discharge: 2022-01-29 | Disposition: A | Discharge status: Home / Self Care | Payer: BC Managed Care – PPO | Source: Ambulatory Visit | Attending: Radiation Oncology | Admitting: Radiation Oncology

## 2022-01-29 DIAGNOSIS — Z510 Encounter for antineoplastic radiation therapy: Secondary | ICD-10-CM | POA: Diagnosis not present

## 2022-01-29 LAB — RAD ONC ARIA SESSION SUMMARY
Course Elapsed Days: 31
Plan Fractions Treated to Date: 23
Plan Prescribed Dose Per Fraction: 1.8 Gy
Plan Total Fractions Prescribed: 28
Plan Total Prescribed Dose: 50.4 Gy
Reference Point Dosage Given to Date: 41.4 Gy
Reference Point Session Dosage Given: 1.8 Gy
Session Number: 23

## 2022-01-30 ENCOUNTER — Other Ambulatory Visit: Payer: Self-pay

## 2022-01-30 ENCOUNTER — Ambulatory Visit
Admission: RE | Admit: 2022-01-30 | Discharge: 2022-01-30 | Disposition: A | Discharge status: Home / Self Care | Payer: BC Managed Care – PPO | Source: Ambulatory Visit | Attending: Radiation Oncology | Admitting: Radiation Oncology

## 2022-01-30 DIAGNOSIS — Z510 Encounter for antineoplastic radiation therapy: Secondary | ICD-10-CM | POA: Diagnosis not present

## 2022-01-30 LAB — RAD ONC ARIA SESSION SUMMARY
Course Elapsed Days: 32
Plan Fractions Treated to Date: 24
Plan Prescribed Dose Per Fraction: 1.8 Gy
Plan Total Fractions Prescribed: 28
Plan Total Prescribed Dose: 50.4 Gy
Reference Point Dosage Given to Date: 43.2 Gy
Reference Point Session Dosage Given: 1.8 Gy
Session Number: 24

## 2022-02-02 ENCOUNTER — Other Ambulatory Visit: Payer: Self-pay

## 2022-02-02 ENCOUNTER — Ambulatory Visit
Admission: RE | Admit: 2022-02-02 | Discharge: 2022-02-02 | Disposition: A | Discharge status: Home / Self Care | Payer: BC Managed Care – PPO | Source: Ambulatory Visit | Attending: Radiation Oncology | Admitting: Radiation Oncology

## 2022-02-02 DIAGNOSIS — Z510 Encounter for antineoplastic radiation therapy: Secondary | ICD-10-CM | POA: Diagnosis not present

## 2022-02-02 LAB — RAD ONC ARIA SESSION SUMMARY
Course Elapsed Days: 35
Plan Fractions Treated to Date: 25
Plan Prescribed Dose Per Fraction: 1.8 Gy
Plan Total Fractions Prescribed: 28
Plan Total Prescribed Dose: 50.4 Gy
Reference Point Dosage Given to Date: 45 Gy
Reference Point Session Dosage Given: 1.8 Gy
Session Number: 25

## 2022-02-03 ENCOUNTER — Other Ambulatory Visit: Payer: Self-pay

## 2022-02-03 ENCOUNTER — Ambulatory Visit
Admission: RE | Admit: 2022-02-03 | Discharge: 2022-02-03 | Disposition: A | Discharge status: Home / Self Care | Payer: BC Managed Care – PPO | Source: Ambulatory Visit | Attending: Radiation Oncology | Admitting: Radiation Oncology

## 2022-02-03 ENCOUNTER — Ambulatory Visit: Payer: BC Managed Care – PPO | Admitting: Radiation Oncology

## 2022-02-03 DIAGNOSIS — C50411 Malignant neoplasm of upper-outer quadrant of right female breast: Secondary | ICD-10-CM

## 2022-02-03 DIAGNOSIS — Z510 Encounter for antineoplastic radiation therapy: Secondary | ICD-10-CM | POA: Diagnosis not present

## 2022-02-03 LAB — RAD ONC ARIA SESSION SUMMARY
Course Elapsed Days: 36
Plan Fractions Treated to Date: 26
Plan Prescribed Dose Per Fraction: 1.8 Gy
Plan Total Fractions Prescribed: 28
Plan Total Prescribed Dose: 50.4 Gy
Reference Point Dosage Given to Date: 46.8 Gy
Reference Point Session Dosage Given: 1.8 Gy
Session Number: 26

## 2022-02-03 MED ORDER — RADIAPLEXRX EX GEL: Freq: Once | CUTANEOUS | Status: AC

## 2022-02-04 ENCOUNTER — Other Ambulatory Visit: Payer: Self-pay

## 2022-02-04 ENCOUNTER — Ambulatory Visit
Admission: RE | Admit: 2022-02-04 | Discharge: 2022-02-04 | Disposition: A | Discharge status: Home / Self Care | Payer: BC Managed Care – PPO | Source: Ambulatory Visit | Attending: Radiation Oncology | Admitting: Radiation Oncology

## 2022-02-04 DIAGNOSIS — Z510 Encounter for antineoplastic radiation therapy: Secondary | ICD-10-CM | POA: Diagnosis not present

## 2022-02-04 LAB — RAD ONC ARIA SESSION SUMMARY
Course Elapsed Days: 37
Plan Fractions Treated to Date: 27
Plan Prescribed Dose Per Fraction: 1.8 Gy
Plan Total Fractions Prescribed: 28
Plan Total Prescribed Dose: 50.4 Gy
Reference Point Dosage Given to Date: 48.6 Gy
Reference Point Session Dosage Given: 1.8 Gy
Session Number: 27

## 2022-02-05 ENCOUNTER — Other Ambulatory Visit: Payer: Self-pay

## 2022-02-05 ENCOUNTER — Ambulatory Visit
Admission: RE | Admit: 2022-02-05 | Discharge: 2022-02-05 | Disposition: A | Discharge status: Home / Self Care | Payer: BC Managed Care – PPO | Source: Ambulatory Visit | Attending: Radiation Oncology | Admitting: Radiation Oncology

## 2022-02-05 ENCOUNTER — Ambulatory Visit: Payer: BC Managed Care – PPO

## 2022-02-05 DIAGNOSIS — Z170 Estrogen receptor positive status [ER+]: Secondary | ICD-10-CM

## 2022-02-05 DIAGNOSIS — Z510 Encounter for antineoplastic radiation therapy: Secondary | ICD-10-CM | POA: Diagnosis not present

## 2022-02-05 LAB — RAD ONC ARIA SESSION SUMMARY
Course Elapsed Days: 38
Plan Fractions Treated to Date: 28
Plan Prescribed Dose Per Fraction: 1.8 Gy
Plan Total Fractions Prescribed: 28
Plan Total Prescribed Dose: 50.4 Gy
Reference Point Dosage Given to Date: 50.4 Gy
Reference Point Session Dosage Given: 1.8 Gy
Session Number: 28

## 2022-02-06 ENCOUNTER — Other Ambulatory Visit: Payer: Self-pay

## 2022-02-06 ENCOUNTER — Ambulatory Visit
Admission: RE | Admit: 2022-02-06 | Discharge: 2022-02-06 | Disposition: A | Discharge status: Home / Self Care | Payer: BC Managed Care – PPO | Source: Ambulatory Visit | Attending: Radiation Oncology | Admitting: Radiation Oncology

## 2022-02-06 DIAGNOSIS — Z510 Encounter for antineoplastic radiation therapy: Secondary | ICD-10-CM | POA: Diagnosis not present

## 2022-02-06 LAB — RAD ONC ARIA SESSION SUMMARY
Course Elapsed Days: 39
Plan Fractions Treated to Date: 1
Plan Prescribed Dose Per Fraction: 2 Gy
Plan Total Fractions Prescribed: 5
Plan Total Prescribed Dose: 10 Gy
Reference Point Dosage Given to Date: 52.4 Gy
Reference Point Session Dosage Given: 2 Gy
Session Number: 29

## 2022-02-09 ENCOUNTER — Other Ambulatory Visit: Payer: Self-pay

## 2022-02-09 ENCOUNTER — Ambulatory Visit
Admission: RE | Admit: 2022-02-09 | Discharge: 2022-02-09 | Disposition: A | Discharge status: Home / Self Care | Payer: BC Managed Care – PPO | Source: Ambulatory Visit | Attending: Radiation Oncology | Admitting: Radiation Oncology

## 2022-02-09 DIAGNOSIS — Z510 Encounter for antineoplastic radiation therapy: Secondary | ICD-10-CM | POA: Diagnosis not present

## 2022-02-09 LAB — RAD ONC ARIA SESSION SUMMARY
Course Elapsed Days: 42
Plan Fractions Treated to Date: 2
Plan Prescribed Dose Per Fraction: 2 Gy
Plan Total Fractions Prescribed: 5
Plan Total Prescribed Dose: 10 Gy
Reference Point Dosage Given to Date: 54.4 Gy
Reference Point Session Dosage Given: 2 Gy
Session Number: 30

## 2022-02-10 ENCOUNTER — Ambulatory Visit
Admission: RE | Admit: 2022-02-10 | Discharge: 2022-02-10 | Disposition: A | Discharge status: Home / Self Care | Payer: BC Managed Care – PPO | Source: Ambulatory Visit | Attending: Radiation Oncology | Admitting: Radiation Oncology

## 2022-02-10 ENCOUNTER — Other Ambulatory Visit: Payer: Self-pay

## 2022-02-10 DIAGNOSIS — Z510 Encounter for antineoplastic radiation therapy: Secondary | ICD-10-CM | POA: Diagnosis not present

## 2022-02-10 LAB — RAD ONC ARIA SESSION SUMMARY
Course Elapsed Days: 43
Plan Fractions Treated to Date: 3
Plan Prescribed Dose Per Fraction: 2 Gy
Plan Total Fractions Prescribed: 5
Plan Total Prescribed Dose: 10 Gy
Reference Point Dosage Given to Date: 56.4 Gy
Reference Point Session Dosage Given: 2 Gy
Session Number: 31

## 2022-02-11 ENCOUNTER — Encounter: Payer: Self-pay | Admitting: Hematology and Oncology

## 2022-02-11 ENCOUNTER — Other Ambulatory Visit: Payer: Self-pay

## 2022-02-11 ENCOUNTER — Ambulatory Visit
Admission: RE | Admit: 2022-02-11 | Discharge: 2022-02-11 | Disposition: A | Discharge status: Home / Self Care | Payer: BC Managed Care – PPO | Source: Ambulatory Visit | Attending: Radiation Oncology | Admitting: Radiation Oncology

## 2022-02-11 ENCOUNTER — Ambulatory Visit: Payer: BC Managed Care – PPO

## 2022-02-11 ENCOUNTER — Encounter: Payer: Self-pay | Admitting: *Deleted

## 2022-02-11 DIAGNOSIS — C50411 Malignant neoplasm of upper-outer quadrant of right female breast: Secondary | ICD-10-CM

## 2022-02-11 DIAGNOSIS — Z510 Encounter for antineoplastic radiation therapy: Secondary | ICD-10-CM | POA: Diagnosis not present

## 2022-02-11 LAB — RAD ONC ARIA SESSION SUMMARY
Course Elapsed Days: 44
Plan Fractions Treated to Date: 4
Plan Prescribed Dose Per Fraction: 2 Gy
Plan Total Fractions Prescribed: 5
Plan Total Prescribed Dose: 10 Gy
Reference Point Dosage Given to Date: 58.4 Gy
Reference Point Session Dosage Given: 2 Gy
Session Number: 32

## 2022-02-12 ENCOUNTER — Other Ambulatory Visit: Payer: Self-pay

## 2022-02-12 ENCOUNTER — Ambulatory Visit
Admission: RE | Admit: 2022-02-12 | Discharge: 2022-02-12 | Disposition: A | Discharge status: Home / Self Care | Payer: BC Managed Care – PPO | Source: Ambulatory Visit | Attending: Radiation Oncology | Admitting: Radiation Oncology

## 2022-02-12 ENCOUNTER — Encounter: Payer: Self-pay | Admitting: Radiation Oncology

## 2022-02-12 DIAGNOSIS — Z510 Encounter for antineoplastic radiation therapy: Secondary | ICD-10-CM | POA: Diagnosis not present

## 2022-02-12 LAB — RAD ONC ARIA SESSION SUMMARY
Course Elapsed Days: 45
Plan Fractions Treated to Date: 5
Plan Prescribed Dose Per Fraction: 2 Gy
Plan Total Fractions Prescribed: 5
Plan Total Prescribed Dose: 10 Gy
Reference Point Dosage Given to Date: 60.4 Gy
Reference Point Session Dosage Given: 2 Gy
Session Number: 33

## 2022-03-02 ENCOUNTER — Ambulatory Visit: Payer: BC Managed Care – PPO | Attending: Surgery | Admitting: Physical Therapy

## 2022-03-02 ENCOUNTER — Encounter: Payer: Self-pay | Admitting: Physical Therapy

## 2022-03-02 DIAGNOSIS — Z483 Aftercare following surgery for neoplasm: Secondary | ICD-10-CM | POA: Insufficient documentation

## 2022-03-02 NOTE — Therapy (Signed)
OUTPATIENT PHYSICAL THERAPY SOZO SCREENING NOTE   Patient Name: Carly Miller MRN: [id number] DOB:1975-11-30, 46 y.o., female Today's Date: 03/02/2022  PCP: Louretta Shorten, MD REFERRING PROVIDER: Erroll Luna, MD   PT End of Session - 03/02/22 1302     Visit Number 2    Number of Visits 2    PT Start Time 9326    PT Stop Time 1302    PT Time Calculation (min) 7 min    Activity Tolerance Patient tolerated treatment well    Behavior During Therapy WFL for tasks assessed/performed             Past Medical History:  Diagnosis Date   Breast cancer ([place])    Endometriosis    Hematuria    Hyperlipidemia    Mass of ovary    RIGHT   Pelvic pain in female    Urgency of urination    UTI (lower urinary tract infection)    Past Surgical History:  Procedure Laterality Date   ABDOMINAL HYSTERECTOMY N/A 08/31/2013   Procedure: HYSTERECTOMY ABDOMINAL, LYSIS OF ADHESIONS;  Surgeon: Luz Lex, MD;  Location: [facility];  Service: Gynecology;  Laterality: N/A;   ABSCESS DRAINAGE  09/22/1999   RECTAL   ANAL SPHINCTEROTOMY  09/21/1998   BREAST BIOPSY Right 10/27/2021   BREAST LUMPECTOMY WITH RADIOACTIVE SEED AND SENTINEL LYMPH NODE BIOPSY Right 11/25/2021   Procedure: RIGHT BREAST LUMPECTOMY WITH RADIOACTIVE SEED AND SENTINEL LYMPH NODE BIOPSY;  Surgeon: Erroll Luna, MD;  Location: [facility];  Service: General;  Laterality: Right;  90 MINUTES ROOM 8   DIAGNOSTIC LAPAROSCOPY LEFT OOPHORECTOMY  08/31/2007   ENDOMETRIOMA   LAPAROSCOPIC CHOLECYSTECTOMY  06/21/2012   LAPAROSCOPY N/A 08/31/2013   Procedure: LAPAROSCOPY DIAGNOSTIC;  Surgeon: Luz Lex, MD;  Location: [facility];  Service: Gynecology;  Laterality: N/A;   Patient Active Problem List   Diagnosis Date Noted   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 12/22/2021   Genetic testing 11/17/2021   Malignant neoplasm of upper-outer quadrant of right breast in female, estrogen receptor  positive ([place]) 10/31/2021   S/P TAH (total abdominal hysterectomy) 08/31/2013   TOA (tubo-ovarian abscess) 08/31/2013   Endometrioma of ovary 08/31/2013   Pelvic adhesions 08/31/2013   S/P removal of right ovary 08/31/2013    REFERRING DIAG: right breast cancer at risk for lymphedema  THERAPY DIAG:  Aftercare following surgery for neoplasm  PERTINENT HISTORY: Patient was diagnosed on 10/10/2021 with 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 She underwent a right lumpectomy and sentinel node biopsy (4 negative nodes) on 11/25/2021. It is ER/PR positive and HER2 negative with a Ki67 of < 5%. She had breast implants placed in 2016.  PRECAUTIONS: right UE Lymphedema risk  SUBJECTIVE: Pt here for SOZO screen  PAIN:  Are you having pain? No  SOZO SCREENING: Patient was assessed today using the SOZO machine to determine the lymphedema index score. This was compared to her baseline score. It was determined that she is within the recommended range when compared to her baseline and no further action is needed at this time. She will continue SOZO screenings. These are done every 3 months for 2 years post operatively followed by every 6 months for 2 years, and then annually.   L-DEX FLOWSHEETS - 03/02/22 1300       L-DEX LYMPHEDEMA SCREENING   Measurement Type Unilateral    L-DEX MEASUREMENT EXTREMITY Upper Extremity    POSITION  Standing  DOMINANT SIDE Right    At Risk Side Right    BASELINE SCORE (UNILATERAL) 1.7    L-DEX SCORE (UNILATERAL) 0.5    VALUE CHANGE (UNILAT) -1.2             [name], [place] 03/02/22 1:04 PM

## 2022-03-05 ENCOUNTER — Telehealth: Payer: Self-pay | Admitting: Adult Health

## 2022-03-05 NOTE — Telephone Encounter (Signed)
Called and talked to patient about SCP appointment.  She will see [place] on 04/02/22 at 0945 for SCP visit.  Scheduling message sent.    Wilber Bihari, NP 03/05/22 3:25 PM Medical Oncology and Hematology [facility] [place], [place] [address] Tel. [phone]    Fax. [phone]

## 2022-03-06 ENCOUNTER — Telehealth: Payer: Self-pay | Admitting: Adult Health

## 2022-03-06 NOTE — Telephone Encounter (Signed)
.  Called pt per 6/15 inbasket , Patient was unavailable, a message with appt time and date was left with number on file.

## 2022-03-11 ENCOUNTER — Encounter: Payer: Self-pay | Admitting: Radiation Oncology

## 2022-03-11 NOTE — Progress Notes (Incomplete)
  Radiation Oncology         [phone] ________________________________  Patient Name: Carly Miller MRN: [id number] DOB: Jul 14, 1976 Referring Physician: Corinna Capra DAVID (Profile Not Attached) Date of Service: 02/12/2022 [facility], [place]                                                        End Of Treatment Note  Diagnoses: C50.411-Malignant neoplasm of upper-outer quadrant of right female breast Z17.0-Estrogen receptor positive status [ER+]  Cancer Staging: S/p right lumpectomy: Stage IA  (pT1c, pN0) Right Breast UOQ, Invasive ductal carcinoma w/ intermediate grade DCIS, ER+ / PR+ / Her2-, Grade 1  Intent: Curative  Radiation Treatment Dates: 12/29/2021 through 02/12/2022 Site Technique Total Dose (Gy) Dose per Fx (Gy) Completed Fx Beam Energies  Breast, Right: Breast_R 3D 50.4/50.4 1.8 28/28 6X  Breast, Right: Breast_R_Bst specialPort 10/10 2 5/5 6E   Narrative: The patient tolerated radiation therapy relatively well. During her final weekly treatment check on 02/11/22, the patient reported some shooting pain to back of head (one episode), fatigue, hyperpigmentation, shoulder tightness when lifting her right arm. Physical exam performed on that same date showed hyperpigmentation changes some erythema to the right breast and supraclavicular area.  No skin breakdown was appreciated.  Some swelling was also noted in the breast.  Plan: The patient will follow-up with radiation oncology in one month or sooner if skin issues become a problem for her .  ________________________________________________ -----------------------------------  Blair Promise, PhD, MD  This document serves as a record of services personally performed by Gery Pray, MD. It was created on his behalf by Roney Mans, a trained medical scribe. The creation of this record is based on the scribe's personal observations and the provider's statements to them. This document has been checked and  approved by the attending provider.

## 2022-03-16 ENCOUNTER — Encounter: Payer: Self-pay | Admitting: Radiation Oncology

## 2022-03-16 ENCOUNTER — Other Ambulatory Visit: Payer: Self-pay

## 2022-03-16 ENCOUNTER — Ambulatory Visit
Admission: RE | Admit: 2022-03-16 | Discharge: 2022-03-16 | Disposition: A | Discharge status: Home / Self Care | Payer: BC Managed Care – PPO | Source: Ambulatory Visit | Attending: Radiation Oncology | Admitting: Radiation Oncology

## 2022-03-16 DIAGNOSIS — Z170 Estrogen receptor positive status [ER+]: Secondary | ICD-10-CM | POA: Diagnosis present

## 2022-03-16 DIAGNOSIS — C50411 Malignant neoplasm of upper-outer quadrant of right female breast: Secondary | ICD-10-CM | POA: Insufficient documentation

## 2022-03-16 HISTORY — DX: Personal history of irradiation: Z92.3

## 2022-03-30 ENCOUNTER — Ambulatory Visit: Payer: BC Managed Care – PPO | Admitting: Hematology and Oncology

## 2022-04-01 NOTE — Progress Notes (Signed)
SURVIVORSHIP VISIT:   BRIEF ONCOLOGIC HISTORY:  Oncology History  Malignant neoplasm of upper-outer quadrant of right breast in female, estrogen receptor positive ([place])  10/10/2021 Mammogram   Indeterminate right breast mass at the 11 o'clock position corresponding to the patient, recommend ultrasound-guided biopsy of the, no suspicious right axillary lymphadenopathy.   Targeted ultrasound is performed, showing an irregular, hypoechoic mass with associated vascularity at the 11 o'clock position 2 cm from the nipple. It measures 1.9 x 1.6 x 1.0 cm. This corresponds with the patient's palpable lump. Evaluation of the right axilla demonstrates no suspicious lymphadenopathy.   10/31/2021 Initial Diagnosis   Malignant neoplasm of upper-outer quadrant of right breast in female, estrogen receptor positive ([place])   11/05/2021 Cancer Staging   Staging form: Breast, AJCC 8th Edition - Clinical: Stage IA (cT1c, cN0, cM0, G2, ER+, PR+, HER2-) - Signed by Benay Pike, MD on 11/05/2021 Stage prefix: Initial diagnosis Histologic grading system: 3 grade system    Genetic Testing   [facility] CancerNext-Expanded Panel was Negative. Report date is 11/17/2021.  The CancerNext-Expanded gene panel offered by [facility] and includes sequencing, rearrangement, and RNA analysis for the following 77 genes: AIP, ALK, APC, ATM, AXIN2, BAP1, BARD1, BLM, BMPR1A, BRCA1, BRCA2, BRIP1, CDC73, CDH1, CDK4, CDKN1B, CDKN2A, CHEK2, CTNNA1, DICER1, FANCC, FH, FLCN, GALNT12, KIF1B, LZTR1, MAX, MEN1, MET, MLH1, MSH2, MSH3, MSH6, MUTYH, NBN, NF1, NF2, NTHL1, PALB2, PHOX2B, PMS2, POT1, PRKAR1A, PTCH1, PTEN, RAD51C, RAD51D, RB1, RECQL, RET, SDHA, SDHAF2, SDHB, SDHC, SDHD, SMAD4, SMARCA4, SMARCB1, SMARCE1, STK11, SUFU, TMEM127, TP53, TSC1, TSC2, VHL and XRCC2 (sequencing and deletion/duplication); EGFR, EGLN1, HOXB13, KIT, MITF, PDGFRA, POLD1, and POLE (sequencing only); EPCAM and GREM1 (deletion/duplication only).     Pathology  Results    Biopsy of the area showed IDC, grade 1-2, ER positive 100% moderate staining, PR positive 100% strong staining, Her 2 neg. Ki 5%.   11/25/2021 Surgery   Right breast lumpectomy showed invasive ductal carcinoma, 2 cm, grade 1, intermediate grade DCIS, 4 out of 4 sentinel lymph nodes without any micrometastasis, prior prognostics showed ER 100% positive strong staining, PR 100% positive strong staining, HER2 negative, Ki-67 less than 5%    Oncotype testing     Oncotype DX was obtained on the final surgical sample and the recurrence score of 17 predicts a risk of recurrence outside the breast over the next 9 years of 5%, if the patient's only systemic therapy is an antiestrogen for 5 years.  It also predicts no significant benefit from chemotherapy.     INTERVAL HISTORY:  Ms. Marszalek to review her survivorship care plan detailing her treatment course for breast cancer, as well as monitoring long-term side effects of that treatment, education regarding health maintenance, screening, and overall wellness and health promotion.     Overall, Ms. Giammona reports feeling quite well.  She is taking Tamoxifen daily with manageable hot flashes.  She was recently prescribed a medication she cannot recall by her PCP to help with sleep.  Otherwise she is feeling well and without any concerns.    REVIEW OF SYSTEMS:  Review of Systems  Constitutional:  Negative for appetite change, chills, fatigue, fever and unexpected weight change.  HENT:   Negative for hearing loss, lump/mass and trouble swallowing.   Eyes:  Negative for eye problems and icterus.  Respiratory:  Negative for chest tightness, cough and shortness of breath.   Cardiovascular:  Negative for chest pain, leg swelling and palpitations.  Gastrointestinal:  Negative for abdominal distention, abdominal pain,  constipation, diarrhea, nausea and vomiting.  Endocrine: Positive for hot flashes.  Genitourinary:  Negative for difficulty urinating.    Musculoskeletal:  Negative for arthralgias.  Skin:  Negative for itching and rash.  Neurological:  Negative for dizziness, extremity weakness, headaches and numbness.  Hematological:  Negative for adenopathy. Does not bruise/bleed easily.  Psychiatric/Behavioral:  Negative for depression. The patient is not nervous/anxious.    Breast: Denies any new nodularity, masses, tenderness, nipple changes, or nipple discharge.    ONCOLOGY TREATMENT TEAM:  1. Surgeon:  Dr. Brantley Stage at [facility] 2. Medical Oncologist: Dr. Lindi Adie  3. Radiation Oncologist: Dr. Sondra Come    PAST MEDICAL/SURGICAL HISTORY:  Past Medical History:  Diagnosis Date   Breast cancer [facility])    Endometriosis    Hematuria    History of radiation therapy    Right Breast- 12/29/21-02/12/22- Dr. Gery Pray   Hyperlipidemia    Mass of ovary    RIGHT   Pelvic pain in female    Urgency of urination    UTI (lower urinary tract infection)    Past Surgical History:  Procedure Laterality Date   ABDOMINAL HYSTERECTOMY N/A 08/31/2013   Procedure: HYSTERECTOMY ABDOMINAL, LYSIS OF ADHESIONS;  Surgeon: Luz Lex, MD;  Location: [facility];  Service: Gynecology;  Laterality: N/A;   ABSCESS DRAINAGE  09/22/1999   RECTAL   ANAL SPHINCTEROTOMY  09/21/1998   BREAST BIOPSY Right 10/27/2021   BREAST LUMPECTOMY WITH RADIOACTIVE SEED AND SENTINEL LYMPH NODE BIOPSY Right 11/25/2021   Procedure: RIGHT BREAST LUMPECTOMY WITH RADIOACTIVE SEED AND SENTINEL LYMPH NODE BIOPSY;  Surgeon: Erroll Luna, MD;  Location: [facility];  Service: General;  Laterality: Right;  90 MINUTES ROOM 8   DIAGNOSTIC LAPAROSCOPY LEFT OOPHORECTOMY  08/31/2007   ENDOMETRIOMA   LAPAROSCOPIC CHOLECYSTECTOMY  06/21/2012   LAPAROSCOPY N/A 08/31/2013   Procedure: LAPAROSCOPY DIAGNOSTIC;  Surgeon: Luz Lex, MD;  Location: [facility];  Service: Gynecology;  Laterality: N/A;     ALLERGIES:  Allergies   Allergen Reactions   Naproxen Sodium Other (See Comments), Anaphylaxis and Hives    "Feel like I'm choking when I take it" "feels like I'm choking" "Feel like I'm choking when I take it"      CURRENT MEDICATIONS:  Outpatient Encounter Medications as of 04/02/2022  Medication Sig   escitalopram (LEXAPRO) 10 MG tablet Take 10 mg by mouth daily.   Multiple Vitamin (MULTIVITAMIN) capsule Take 1 capsule by mouth daily.   Red Yeast Rice Extract 600 MG TABS Take by mouth. (Patient not taking: Reported on 03/16/2022)   rosuvastatin (CRESTOR) 10 MG tablet Take 10 mg by mouth daily.   tamoxifen (NOLVADEX) 20 MG tablet Take 1 tablet (20 mg total) by mouth daily.   zolpidem (AMBIEN) 10 MG tablet zolpidem 10 mg tablet  TAKE ONE TABLET AT BEDTIME AS NEEDED FOR INSOMNIA   No facility-administered encounter medications on file as of 04/02/2022.     ONCOLOGIC FAMILY HISTORY:  Family History  Problem Relation Age of Onset   Asthma Mother    Thyroid disease Mother    Asthma Father    Diabetes Father    Thyroid disease Father    Kidney disease Sister    Thyroid disease Maternal Aunt    Thyroid disease Maternal Uncle    Leukemia Maternal Grandmother        dx. 60s   Breast cancer Neg Hx      SOCIAL HISTORY:  Social History  Socioeconomic History   Marital status: Married    Spouse name: Not on file   Number of children: Not on file   Years of education: Not on file   Highest education level: Not on file  Occupational History   Not on file  Tobacco Use   Smoking status: Never   Smokeless tobacco: Never  Vaping Use   Vaping Use: Never used  Substance and Sexual Activity   Alcohol use: No   Drug use: No   Sexual activity: Not on file  Other Topics Concern   Not on file  Social History Narrative   Not on file   Social Determinants of Health   Financial Resource Strain: Low Risk  (11/05/2021)   Overall Financial Resource Strain (CARDIA)    Difficulty of Paying Living  Expenses: Not hard at all  Food Insecurity: No Food Insecurity (11/05/2021)   Hunger Vital Sign    Worried About Running Out of Food in the Last Year: Never true    Gregory in the Last Year: Never true  Transportation Needs: No Transportation Needs (11/05/2021)   PRAPARE - Hydrologist (Medical): No    Lack of Transportation (Non-Medical): No  Physical Activity: Not on file  Stress: Not on file  Social Connections: Not on file  Intimate Partner Violence: Not on file     OBSERVATIONS/OBJECTIVE:  BP 106/73 (BP Location: Left Arm, Patient Position: Sitting)   Pulse 68   Temp 98.1 F (36.7 C) (Tympanic)   Resp 18   Ht '5\' 6"'  (1.676 m)   Wt 162 lb (73.5 kg)   LMP 08/10/2013   SpO2 100%   BMI 26.15 kg/m  GENERAL: Patient is a well appearing female in no acute distress HEENT:  Sclerae anicteric.  Oropharynx clear and moist. No ulcerations or evidence of oropharyngeal candidiasis. Neck is supple.  NODES:  No cervical, supraclavicular, or axillary lymphadenopathy palpated.  BREAST EXAM:  right breast s/p lumpectomy & radiation, no sign of local recurrence, left breast is benign LUNGS:  Clear to auscultation bilaterally.  No wheezes or rhonchi. HEART:  Regular rate and rhythm. No murmur appreciated. ABDOMEN:  Soft, nontender.  Positive, normoactive bowel sounds. No organomegaly palpated. MSK:  No focal spinal tenderness to palpation. Full range of motion bilaterally in the upper extremities. EXTREMITIES:  No peripheral edema.   SKIN:  Clear with no obvious rashes or skin changes. No nail dyscrasia. NEURO:  Nonfocal. Well oriented.  Appropriate affect.  LABORATORY DATA:  None for this visit.  DIAGNOSTIC IMAGING:  None for this visit.   ASSESSMENT AND PLAN:  Ms.. Llamas is a pleasant 46 y.o. 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  She presents to the Survivorship Clinic for our initial meeting and routine follow-up post-completion of treatment for breast cancer.    1. Stage IA right/ breast cancer:  Ms. Waltermire is continuing to recover from definitive treatment for breast cancer. She will follow-up with her medical oncologist, Dr. Chryl Heck in 6 months with history and physical exam per surveillance protocol.  She will continue her anti-estrogen therapy with Tamoxifen. Thus far, she is tolerating the Tamoxifen well, with minimal side effects. Her mammogram is due 07/2022; orders placed today. Today, a comprehensive survivorship care plan and treatment summary was reviewed with the patient today detailing her breast cancer diagnosis, treatment  course, potential late/long-term effects of treatment, appropriate follow-up care with recommendations for the future, and patient education resources.  A copy of this summary, along with a letter will be sent to the patient's primary care provider via mail/fax/In Basket message after today's visit.    2. Bone health:   She was given education on specific activities to promote bone health.  3. Cancer screening:  Due to Ms. Hailes's history and her age, she should receive screening for skin cancers, colon cancer, and gynecologic cancers.  The information and recommendations are listed on the patient's comprehensive care plan/treatment summary and were reviewed in detail with the patient.    4. Health maintenance and wellness promotion: Ms. Hawkey was encouraged to consume 5-7 servings of fruits and vegetables per day. We reviewed the "Nutrition Rainbow" handout.  She was also encouraged to engage in moderate to vigorous exercise for 30 minutes per day most days of the week. We discussed the [employer] fitness program, which is designed for cancer survivors to help them become more physically fit after cancer treatments.  She was instructed  to limit her alcohol consumption and continue to abstain from tobacco use.     5. Support services/counseling: It is not uncommon for this period of the patient's cancer care trajectory to be one of many emotions and stressors. She was given information regarding our available services and encouraged to contact me with any questions or for help enrolling in any of our support group/programs.    Follow up instructions:    -Return to cancer center in 6 months for f/u with Dr. Chryl Heck  -Mammogram due in 07/2022 -Follow up with surgery 1 year -She is welcome to return back to the Survivorship Clinic at any time; no additional follow-up needed at this time.  -Consider referral back to survivorship as a long-term survivor for continued surveillance  The patient was provided an opportunity to ask questions and all were answered. The patient agreed with the plan and demonstrated an understanding of the instructions.   Total encounter time:40 minutes*in face-to-face visit time, chart review, lab review, care coordination, order entry, and documentation of the encounter time.    Wilber Bihari, NP 04/04/22 12:57 PM Medical Oncology and Hematology [facility] [place], [place] [address] Tel. [phone]    Fax. [phone]  *Total Encounter Time as defined by the Centers for Medicare and Medicaid Services includes, in addition to the face-to-face time of a patient visit (documented in the note above) non-face-to-face time: obtaining and reviewing outside history, ordering and reviewing medications, tests or procedures, care coordination (communications with other health care professionals or caregivers) and documentation in the medical record.

## 2022-04-02 ENCOUNTER — Inpatient Hospital Stay: Payer: BC Managed Care – PPO | Attending: Hematology and Oncology | Admitting: Adult Health

## 2022-04-02 ENCOUNTER — Encounter: Payer: Self-pay | Admitting: Adult Health

## 2022-04-02 ENCOUNTER — Other Ambulatory Visit: Payer: Self-pay

## 2022-04-02 VITALS — BP 106/73 | HR 68 | Temp 98.1°F | Resp 18 | Ht 66.0 in | Wt 162.0 lb

## 2022-04-02 DIAGNOSIS — Z79810 Long term (current) use of selective estrogen receptor modulators (SERMs): Secondary | ICD-10-CM | POA: Insufficient documentation

## 2022-04-02 DIAGNOSIS — C50411 Malignant neoplasm of upper-outer quadrant of right female breast: Secondary | ICD-10-CM | POA: Insufficient documentation

## 2022-04-02 DIAGNOSIS — Z170 Estrogen receptor positive status [ER+]: Secondary | ICD-10-CM | POA: Insufficient documentation

## 2022-04-02 NOTE — Progress Notes (Signed)
Called the office of Dr Corinna Capra requesting original abnormal MM to be faxed.

## 2022-04-06 ENCOUNTER — Encounter: Payer: Self-pay | Admitting: Adult Health

## 2022-04-06 NOTE — Progress Notes (Unsigned)
    Subjective:    CC: R thigh pain and paresthesias  I, Molly Weber, LAT, ATC, am serving as scribe for Dr. Lynne Leader.  HPI: Pt is a 46 y/o female presenting w/ c/o R thigh pain and paresthesias x .  She locates her symptoms specifically to .  Of note, pt is being treated for R breast CA.  Low back pain: R LE paresthesias: yes, R LE weakness: Aggravating factors: Treatments tried:   Pertinent review of Systems: ***  Relevant historical information: ***   Objective:   There were no vitals filed for this visit. General: Well Developed, well nourished, and in no acute distress.   MSK: ***  Lab and Radiology Results No results found for this or any previous visit (from the past 72 hour(s)). No results found.    Impression and Recommendations:    Assessment and Plan: 46 y.o. female with ***.  PDMP not reviewed this encounter. No orders of the defined types were placed in this encounter.  No orders of the defined types were placed in this encounter.   Discussed warning signs or symptoms. Please see discharge instructions. Patient expresses understanding.   ***

## 2022-04-07 ENCOUNTER — Ambulatory Visit (INDEPENDENT_AMBULATORY_CARE_PROVIDER_SITE_OTHER): Payer: BC Managed Care – PPO

## 2022-04-07 ENCOUNTER — Ambulatory Visit: Payer: BC Managed Care – PPO | Admitting: Family Medicine

## 2022-04-07 VITALS — BP 116/76 | HR 76 | Ht 66.0 in | Wt 162.8 lb

## 2022-04-07 DIAGNOSIS — M5441 Lumbago with sciatica, right side: Secondary | ICD-10-CM

## 2022-04-07 DIAGNOSIS — M25551 Pain in right hip: Secondary | ICD-10-CM

## 2022-04-07 DIAGNOSIS — G8929 Other chronic pain: Secondary | ICD-10-CM

## 2022-04-07 NOTE — Patient Instructions (Addendum)
Thank you for coming in today.   Please get an Xray today before you leave   I've referred you to Physical Therapy.  Let us know if you don't hear from them in one week.   Check back in 6 weeks 

## 2022-04-08 NOTE — Progress Notes (Signed)
Lumbar spine x-ray looks normal to radiology.

## 2022-04-08 NOTE — Progress Notes (Signed)
Right hip x-ray looks normal to radiology

## 2022-04-17 ENCOUNTER — Encounter: Payer: Self-pay | Admitting: Physical Therapy

## 2022-04-17 ENCOUNTER — Ambulatory Visit: Payer: BC Managed Care – PPO | Admitting: Physical Therapy

## 2022-04-17 DIAGNOSIS — M79604 Pain in right leg: Secondary | ICD-10-CM

## 2022-04-17 DIAGNOSIS — R252 Cramp and spasm: Secondary | ICD-10-CM

## 2022-04-17 DIAGNOSIS — R208 Other disturbances of skin sensation: Secondary | ICD-10-CM

## 2022-04-17 DIAGNOSIS — R29898 Other symptoms and signs involving the musculoskeletal system: Secondary | ICD-10-CM | POA: Diagnosis not present

## 2022-04-17 DIAGNOSIS — M6281 Muscle weakness (generalized): Secondary | ICD-10-CM | POA: Diagnosis not present

## 2022-04-17 NOTE — Therapy (Signed)
OUTPATIENT PHYSICAL THERAPY THORACOLUMBAR EVALUATION   Patient Name: EMYAH ROZNOWSKI MRN: [id number] DOB:01-17-1976, 46 y.o., female Today's Date: 04/17/2022   PT End of Session - 04/17/22 0849     Visit Number 1    Number of Visits 9    Date for PT Re-Evaluation 06/12/22    Authorization Type BCBS    PT Start Time 0801    PT Stop Time 4315    PT Time Calculation (min) 40 min    Activity Tolerance Patient tolerated treatment well    Behavior During Therapy [facility] for tasks assessed/performed             Past Medical History:  Diagnosis Date   Breast cancer ([place])    Endometriosis    Hematuria    History of radiation therapy    Right Breast- 12/29/21-02/12/22- Dr. Gery Pray   Hyperlipidemia    Mass of ovary    RIGHT   Pelvic pain in female    Urgency of urination    UTI (lower urinary tract infection)    Past Surgical History:  Procedure Laterality Date   ABDOMINAL HYSTERECTOMY N/A 08/31/2013   Procedure: HYSTERECTOMY ABDOMINAL, LYSIS OF ADHESIONS;  Surgeon: Luz Lex, MD;  Location: [place];  Service: Gynecology;  Laterality: N/A;   ABSCESS DRAINAGE  09/22/1999   RECTAL   ANAL SPHINCTEROTOMY  09/21/1998   BREAST BIOPSY Right 10/27/2021   BREAST LUMPECTOMY WITH RADIOACTIVE SEED AND SENTINEL LYMPH NODE BIOPSY Right 11/25/2021   Procedure: RIGHT BREAST LUMPECTOMY WITH RADIOACTIVE SEED AND SENTINEL LYMPH NODE BIOPSY;  Surgeon: Erroll Luna, MD;  Location: [place];  Service: General;  Laterality: Right;  90 MINUTES ROOM 8   DIAGNOSTIC LAPAROSCOPY LEFT OOPHORECTOMY  08/31/2007   ENDOMETRIOMA   LAPAROSCOPIC CHOLECYSTECTOMY  06/21/2012   LAPAROSCOPY N/A 08/31/2013   Procedure: LAPAROSCOPY DIAGNOSTIC;  Surgeon: Luz Lex, MD;  Location: [facility];  Service: Gynecology;  Laterality: N/A;   Patient Active Problem List   Diagnosis Date Noted   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 12/22/2021   Genetic testing  11/17/2021   Malignant neoplasm of upper-outer quadrant of right breast in female, estrogen receptor positive ([place]) 10/31/2021   S/P TAH (total abdominal hysterectomy) 08/31/2013   TOA (tubo-ovarian abscess) 08/31/2013   Endometrioma of ovary 08/31/2013   Pelvic adhesions 08/31/2013   S/P removal of right ovary 08/31/2013    PCP: Louretta Shorten   REFERRING PROVIDER: Gregor Hams, MD   REFERRING DIAG: [phone] (ICD-10-CM) - Chronic right hip pain M54.41,G89.29 (ICD-10-CM) - Chronic right-sided low back pain with right-sided sciatica   Rationale for Evaluation and Treatment Rehabilitation  THERAPY DIAG:  Pain in right leg - Plan: PT plan of care cert/re-cert  Muscle weakness (generalized) - Plan: PT plan of care cert/re-cert  Cramp and spasm - Plan: PT plan of care cert/re-cert  Other symptoms and signs involving the musculoskeletal system - Plan: PT plan of care cert/re-cert  Other disturbances of skin sensation - Plan: PT plan of care cert/re-cert  ONSET DATE: 93/26/7124   SUBJECTIVE:  SUBJECTIVE STATEMENT: I'm having pain on the outside of my right hip, I saw the doctor who referred me here. I had this pain starting in January but had to deal with breast cancer first before dealing with this. Going up hills, walking a lot, and riding a bike will flare it up it actually first started when I ws doing an hour a day on a stationary bike. I'm not doing extreme exercise now so its more of a numb, dull feeling not sharp anymore more burning. If I exercise much it will start right back up, very irritable. I've tried ice and heat when it flares up but the biggest thing that helps is rest.   PERTINENT HISTORY:  46 y.o. female with right lateral thigh paresthesias and some pain.  Differential includes  meralgia paresthetica, hip abductor tendinopathy, or potential lumbar radiculopathy.  Patient does have significant weakness at the hip abductors so we will go ahead and try treating that with physical therapy.  Her symptoms do match meralgia paresthetica but she does not have a lot of risk factors for that condition.  Check back in about 6 weeks.  If not improved with PT and hip abductor strengthening will consider ultrasound-guided diagnostic and potentially therapeutic injection of the lateral femoral cutaneous nerve.  Recheck in about 6 weeks.Marland Kitchen   PAIN:  Are you having pain? No currently, 5-6/10 at worst definitely a constant pain    PRECAUTIONS: None  WEIGHT BEARING RESTRICTIONS No  FALLS:  Has patient fallen in last 6 months? No  LIVING ENVIRONMENT: Lives with: lives with their spouse Lives in: House/apartment Stairs: Yes: Internal: 1-2 steps; bilateral but cannot reach both and External: flight steps; bilateral but cannot reach both Has following equipment at home: None  OCCUPATION: auditor   PLOF: Independent, Independent with basic ADLs, Independent with gait, and Independent with transfers  PATIENT GOALS be able to be more active, reduce pain, potentially get back to running/bike riding    OBJECTIVE:   DIAGNOSTIC FINDINGS:  FINDINGS: There is no evidence of lumbar spine fracture. Alignment is normal. Intervertebral disc spaces are maintained.  FINDINGS: There is no evidence of hip fracture or dislocation. There is no evidence of arthropathy or other focal bone abnormality.    PATIENT SURVEYS:  FOTO 57.3  SCREENING FOR RED FLAGS: Bowel or bladder incontinence: No Spinal tumors: No Cauda equina syndrome: No Compression fracture: No Abdominal aneurysm: No  COGNITION:  Overall cognitive status: Within functional limits for tasks assessed     SENSATION: Numbness lateral R thigh   MUSCLE LENGTH: Quads moderate limitation B, hip flexors mildly tight, R HS and  piriformis quite tight, L HS and piriformis mildly tight; noted L LE about 1/2 than the R LE (able to correct 90% with SI MET)  POSTURE: rounded shoulders, forward head, decreased lumbar lordosis, increased thoracic kyphosis, and flexed trunk   PALPATION: R quad mm belly tight and tender, lateral R thigh very tight and tender, R glutes and piriformis tender without significant trigger points noted   LUMBAR ROM:   Active  A/PROM  eval  Flexion Moderate limitation   Extension Mild limitation   Right lateral flexion WNL   Left lateral flexion WNL  Right rotation   Left rotation    (Blank rows = not tested)  LOWER EXTREMITY ROM:     Active  Right eval Left eval  Hip flexion    Hip extension    Hip abduction    Hip adduction  Hip internal rotation    Hip external rotation    Knee flexion    Knee extension    Ankle dorsiflexion    Ankle plantarflexion    Ankle inversion    Ankle eversion     (Blank rows = not tested)  LOWER EXTREMITY MMT:    MMT Right eval Left eval  Hip flexion 3+ 4  Hip extension 3 3  Hip abduction 4+ 4+  Hip adduction    Hip internal rotation    Hip external rotation    Knee flexion 3 3+  Knee extension 4- 4+  Ankle dorsiflexion 4+ 5  Ankle plantarflexion    Ankle inversion    Ankle eversion     (Blank rows = not tested)  LUMBAR SPECIAL TESTS:  Straight leg raise test: Negative      TODAY'S TREATMENT  SI MET for LLD                        Nustep L3-4 x6 minutes BLEs only             Bridges with red TB around knees 1x10 3 second holds             Supine HS stretch 2x30 seconds B            Piriformis stretch 1x30 seconds B   PATIENT EDUCATION:  Education details: exam findings, anatomy and biomechanics of region, HEP, POC  Person educated: Patient Education method: Explanation, Demonstration, and Handouts Education comprehension: verbalized understanding and returned demonstration   HOME EXERCISE  PROGRAM: [email or web address]  ASSESSMENT:  CLINICAL IMPRESSION: Patient is a 46 y.o. female who was seen today for physical therapy evaluation and treatment for LBP and R hip pain/numbness. Exam reveals significant postural impairment, lots of muscle tightness and trigger points especially in RLE, functional muscle weakness, leg length difference, and impaired sensation R LE. Will benefit from skilled PT services to address impairments, reduce symptoms, and allow her to return to an exercise program without aggravating symptoms.    OBJECTIVE IMPAIRMENTS Abnormal gait, decreased mobility, difficulty walking, decreased strength, hypomobility, increased fascial restrictions, increased muscle spasms, impaired flexibility, impaired sensation, improper body mechanics, postural dysfunction, and pain.   ACTIVITY LIMITATIONS standing, squatting, stairs, transfers, and locomotion level  PARTICIPATION LIMITATIONS: interpersonal relationship, community activity, occupation, and yard work  PERSONAL FACTORS Age, Fitness, Past/current experiences, and Time since onset of injury/illness/exacerbation are also affecting patient's functional outcome.   REHAB POTENTIAL: Good  CLINICAL DECISION MAKING: Stable/uncomplicated  EVALUATION COMPLEXITY: Low   GOALS: Goals reviewed with patient? Yes  SHORT TERM GOALS: Target date: 05/15/2022  Will be independent with appropriate progressive HEP  Baseline: Goal status: INITIAL  2.  Numbness/sx in R LE to have improved by 25%  Baseline:  Goal status: INITIAL  3.  Will have better understanding of ergonomics and general posture Baseline:  Goal status: INITIAL  4.  Flexibility in R hamstrings, quads, piriformis to have improved by 50%  Baseline:  Goal status: INITIAL   LONG TERM GOALS: Target date: 06/12/2022  MMT to have improved by 1 grade in all weak groups  Baseline:  Goal status: INITIAL  2.  Numbness/sx in R LE to have improved by at least 75%   Baseline:  Goal status: INITIAL  3.  Will be able to tolerate at least 30 minutes of gym/machine level exercise without increase in symptoms  Baseline:  Goal status: INITIAL  4.  Will be able  to perform regular walking program for at least 20 minutes daily without increase in symptoms  Baseline:  Goal status: INITIAL    PLAN: PT FREQUENCY: 1x/week  PT DURATION: 8 weeks  PLANNED INTERVENTIONS: Therapeutic exercises, Therapeutic activity, Neuromuscular re-education, Balance training, Gait training, Patient/Family education, Self Care, Joint mobilization, Joint manipulation, Stair training, Orthotic/Fit training, Dry Needling, Spinal mobilization, Cryotherapy, Moist heat, Taping, Traction, Ultrasound, Ionotophoresis 56m/ml Dexamethasone, Manual therapy, and Re-evaluation.  PLAN FOR NEXT SESSION: check leg length difference, MET PRN; teach self correction. Core/hip strength and flexibility, check hip joint mobility and lumbar PAs- mobs PRN/as appropriate. Needs HEP updates weekly, only coming 1x/week due to high copay    Jazz Rogala U PT DPT PN2  04/17/2022, 8:52 AM

## 2022-04-28 ENCOUNTER — Encounter: Payer: Self-pay | Admitting: Adult Health

## 2022-05-01 ENCOUNTER — Encounter: Payer: Self-pay | Admitting: Rehabilitative and Restorative Service Providers"

## 2022-05-01 ENCOUNTER — Ambulatory Visit: Payer: BC Managed Care – PPO | Admitting: Rehabilitative and Restorative Service Providers"

## 2022-05-01 DIAGNOSIS — R29898 Other symptoms and signs involving the musculoskeletal system: Secondary | ICD-10-CM | POA: Diagnosis not present

## 2022-05-01 DIAGNOSIS — M79604 Pain in right leg: Secondary | ICD-10-CM | POA: Diagnosis not present

## 2022-05-01 DIAGNOSIS — M6281 Muscle weakness (generalized): Secondary | ICD-10-CM | POA: Diagnosis not present

## 2022-05-01 DIAGNOSIS — R252 Cramp and spasm: Secondary | ICD-10-CM

## 2022-05-01 NOTE — Therapy (Signed)
OUTPATIENT PHYSICAL THERAPY TREATMENT NOTE   Patient Name: Carly Miller MRN: [id number] DOB:05/14/1976, 46 y.o., female Today's Date: 05/01/2022  PCP: Benjie Karvonen PROVIDER: Gregor Hams, MD   END OF SESSION:   PT End of Session - 05/01/22 0809     Visit Number 2    Number of Visits 9    Date for PT Re-Evaluation 06/12/22    Authorization Type BCBS    PT Start Time 0802    PT Stop Time 0840    PT Time Calculation (min) 38 min    Activity Tolerance Patient tolerated treatment well    Behavior During Therapy [facility] for tasks assessed/performed             Past Medical History:  Diagnosis Date   Breast cancer ([place])    Endometriosis    Hematuria    History of radiation therapy    Right Breast- 12/29/21-02/12/22- Dr. Gery Pray   Hyperlipidemia    Mass of ovary    RIGHT   Pelvic pain in female    Urgency of urination    UTI (lower urinary tract infection)    Past Surgical History:  Procedure Laterality Date   ABDOMINAL HYSTERECTOMY N/A 08/31/2013   Procedure: HYSTERECTOMY ABDOMINAL, LYSIS OF ADHESIONS;  Surgeon: Luz Lex, MD;  Location: [place];  Service: Gynecology;  Laterality: N/A;   ABSCESS DRAINAGE  09/22/1999   RECTAL   ANAL SPHINCTEROTOMY  09/21/1998   BREAST BIOPSY Right 10/27/2021   BREAST LUMPECTOMY WITH RADIOACTIVE SEED AND SENTINEL LYMPH NODE BIOPSY Right 11/25/2021   Procedure: RIGHT BREAST LUMPECTOMY WITH RADIOACTIVE SEED AND SENTINEL LYMPH NODE BIOPSY;  Surgeon: Erroll Luna, MD;  Location: [place];  Service: General;  Laterality: Right;  90 MINUTES ROOM 8   DIAGNOSTIC LAPAROSCOPY LEFT OOPHORECTOMY  08/31/2007   ENDOMETRIOMA   LAPAROSCOPIC CHOLECYSTECTOMY  06/21/2012   LAPAROSCOPY N/A 08/31/2013   Procedure: LAPAROSCOPY DIAGNOSTIC;  Surgeon: Luz Lex, MD;  Location: [facility];  Service: Gynecology;  Laterality: N/A;   Patient Active Problem List   Diagnosis Date Noted    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 12/22/2021   Genetic testing 11/17/2021   Malignant neoplasm of upper-outer quadrant of right breast in female, estrogen receptor positive ([place]) 10/31/2021   S/P TAH (total abdominal hysterectomy) 08/31/2013   TOA (tubo-ovarian abscess) 08/31/2013   Endometrioma of ovary 08/31/2013   Pelvic adhesions 08/31/2013   S/P removal of right ovary 08/31/2013    REFERRING DIAG: M25.551,G89.29 (ICD-10-CM) - Chronic right hip pain M54.41,G89.29 (ICD-10-CM) - Chronic right-sided low back pain with right-sided sciatica   THERAPY DIAG:  Pain in right leg  Muscle weakness (generalized)  Cramp and spasm  Other symptoms and signs involving the musculoskeletal system  Rationale for Evaluation and Treatment Rehabilitation  ONSET DATE: 04/07/2022    SUBJECTIVE:  SUBJECTIVE STATEMENT: Pt indicated feeling better than before.  Pt indicated having less numbness in lateral thigh.  Pt indicated about a week.   Pt indicated she hasn't done the bike again.  Pt indicated having used the stepper/ellipitcal again.     PERTINENT HISTORY:  46 y.o. female with right lateral thigh paresthesias and some pain.  Differential includes meralgia paresthetica, hip abductor tendinopathy, or potential lumbar radiculopathy.  Patient does have significant weakness at the hip abductors so we will go ahead and try treating that with physical therapy.  Her symptoms do match meralgia paresthetica but she does not have a lot of risk factors for that condition.  Check back in about 6 weeks.  If not improved with PT and hip abductor strengthening will consider ultrasound-guided diagnostic and potentially therapeutic injection of the lateral femoral cutaneous nerve.  Recheck in about 6 weeks.Marland Kitchen    PAIN:  Currently,  5-6/10 at worst definitely a constant pain      PRECAUTIONS: None   WEIGHT BEARING RESTRICTIONS No   FALLS:  Has patient fallen in last 6 months? No   LIVING ENVIRONMENT: Lives with: lives with their spouse Lives in: House/apartment Stairs: Yes: Internal: 1-2 steps; bilateral but cannot reach both and External: flight steps; bilateral but cannot reach both Has following equipment at home: None   OCCUPATION: auditor    PLOF: Independent, Independent with basic ADLs, Independent with gait, and Independent with transfers   PATIENT GOALS be able to be more active, reduce pain, potentially get back to running/bike riding      OBJECTIVE:    DIAGNOSTIC FINDINGS:  04/17/2022 FINDINGS: There is no evidence of lumbar spine fracture. Alignment is normal. Intervertebral disc spaces are maintained.   FINDINGS: There is no evidence of hip fracture or dislocation. There is no evidence of arthropathy or other focal bone abnormality.     PATIENT SURVEYS:  04/17/2022 FOTO 57.3   SCREENING FOR RED FLAGS: 04/17/2022 Bowel or bladder incontinence: No Spinal tumors: No Cauda equina syndrome: No Compression fracture: No Abdominal aneurysm: No   COGNITION: 04/17/2022           Overall cognitive status: Within functional limits for tasks assessed                          SENSATION: 04/17/2022 Numbness lateral R thigh    MUSCLE LENGTH: 04/17/2022 Quads moderate limitation B, hip flexors mildly tight, R HS and piriformis quite tight, L HS and piriformis mildly tight; noted L LE about 1/2 than the R LE (able to correct 90% with SI MET)   POSTURE:  04/17/2022 rounded shoulders, forward head, decreased lumbar lordosis, increased thoracic kyphosis, and flexed trunk    PALPATION: 04/17/2022 R quad mm belly tight and tender, lateral R thigh very tight and tender, R glutes and piriformis tender without significant trigger points noted    LUMBAR ROM:    Active  AROM  04/17/2022  Flexion  Moderate limitation   Extension Mild limitation   Right lateral flexion WNL   Left lateral flexion WNL  Right rotation    Left rotation     (Blank rows = not tested)   LOWER EXTREMITY ROM:      Active  Right 04/17/2022 Left 04/17/2022  Hip flexion      Hip extension      Hip abduction      Hip adduction      Hip internal rotation  Hip external rotation      Knee flexion      Knee extension      Ankle dorsiflexion      Ankle plantarflexion      Ankle inversion      Ankle eversion       (Blank rows = not tested)   LOWER EXTREMITY MMT:     MMT Right 04/17/2022 Left 04/17/2022 Right 05/01/2022  Hip flexion 3+ 4 5/5  Hip extension 3 3 5/5  Hip abduction 4+ 4+ 4/5  Hip adduction       Hip internal rotation       Hip external rotation       Knee flexion 3 3+   Knee extension 4- 4+   Ankle dorsiflexion 4+ 5   Ankle plantarflexion       Ankle inversion       Ankle eversion        (Blank rows = not tested)   LUMBAR SPECIAL TESTS:  04/17/2022 Straight leg raise test: Negative      TODAY'S TREATMENT  05/01/2022: Manual:  Compression to Rt glute med    Dry Needling:    Twitch response c localized concordant symptoms from Rt glute med.  No adverse reactions noted.  Verbal permission obtained for performance.  Handout provided for education.   Therex:   Review of existing HEP c minor cues for techniques   Supine bridge c green band around knees x 10 3 sec hold   Supine single Rt knee to opposite shoulder 30 sec x 3   Sidelying Rt hip abduction SLR c cues for positioning x 15 total   Standing doorway hip hike c hip abduction into doorway 5 sec hold x 10 bilateral    Education on pain monitoring scale for activity modification based off symptom response.     04/17/2022 SI MET for LLD                        Nustep L3-4 x6 minutes BLEs only             Bridges with red TB around knees 1x10 3 second holds             Supine HS stretch 2x30 seconds B             Piriformis stretch 1x30 seconds B     PATIENT EDUCATION:  05/01/2022 Education details: HEP progression, DN Person educated: Patient Education method: Explanation, Demonstration, and Handouts Education comprehension: verbalized understanding and returned demonstration     HOME EXERCISE PROGRAM: 05/01/2022 Access Code: [email or web address] URL: [email or web address] Date: 05/01/2022 Prepared by: Scot Jun  Exercises - Bridge with Resistance  - 2 x daily - 7 x weekly - 1 sets - 10-15 reps - 3 hold - Supine Hamstring Stretch with Strap  - 2 x daily - 7 x weekly - 1 sets - 3 reps - 30 hold - Seated Figure 4 Piriformis Stretch  - 2 x daily - 7 x weekly - 1 sets - 3 reps - 30 hold - Sidelying Hip Abduction  - 2 x daily - 7 x weekly - 3 sets - 10 reps - Supine Piriformis Stretch with Foot on Ground  - 2 x daily - 7 x weekly - 1 sets - 5 reps - 30 hold - Supine Piriformis Stretch with Leg Straight  - 2 x daily - 7 x weekly - 1 sets - 3-5 reps - 30 hold -  Standing Hip Hiking (Mirrored)  - 1-2 x daily - 7 x weekly - 1 sets - 10 reps - 5 hold   ASSESSMENT:   CLINICAL IMPRESSION: Good initial reduction of symptoms from first visit.  Still avoiding bike use at this time.  Recheck showed some improvement in hip strength but weakness still noted.  Trigger points c concordant lateral hip symptoms observed and treated today per Pt request.  Plan for assessment and use as desired.      OBJECTIVE IMPAIRMENTS Abnormal gait, decreased mobility, difficulty walking, decreased strength, hypomobility, increased fascial restrictions, increased muscle spasms, impaired flexibility, impaired sensation, improper body mechanics, postural dysfunction, and pain.    ACTIVITY LIMITATIONS standing, squatting, stairs, transfers, and locomotion level   PARTICIPATION LIMITATIONS: interpersonal relationship, community activity, occupation, and yard work   PERSONAL FACTORS Age, Fitness, Past/current experiences,  and Time since onset of injury/illness/exacerbation are also affecting patient's functional outcome.    REHAB POTENTIAL: Good   CLINICAL DECISION MAKING: Stable/uncomplicated   EVALUATION COMPLEXITY: Low     GOALS: Goals reviewed with patient? Yes   SHORT TERM GOALS: Target date: 05/15/2022   Will be independent with appropriate progressive HEP  Baseline: Goal status: ongoing - assessed 05/01/2022   2.  Numbness/sx in R LE to have improved by 25%  Baseline:  Goal status:  MET - assessed 05/01/2022   3.  Will have better understanding of ergonomics and general posture Baseline:  Goal status:  ongoing - assessed 05/01/2022   4.  Flexibility in Rt hamstrings, quads, piriformis to have improved by 50%  Baseline:  Goal status:  ongoing - assessed 05/01/2022     LONG TERM GOALS: Target date: 06/12/2022   MMT to have improved by 1 grade in all weak groups  Baseline:  Goal status:  ongoing - assessed 05/01/2022   2.  Numbness/sx in Rt LE to have improved by at least 75%  Baseline:  Goal status:  ongoing - assessed 05/01/2022   3.  Will be able to tolerate at least 30 minutes of gym/machine level exercise without increase in symptoms  Baseline:  Goal status:  ongoing - assessed 05/01/2022   4.  Will be able to perform regular walking program for at least 20 minutes daily without increase in symptoms  Baseline:  Goal status:  ongoing - assessed 05/01/2022       PLAN: PT FREQUENCY: 1x/week   PT DURATION: 8 weeks   PLANNED INTERVENTIONS: Therapeutic exercises, Therapeutic activity, Neuromuscular re-education, Balance training, Gait training, Patient/Family education, Self Care, Joint mobilization, Joint manipulation, Stair training, Orthotic/Fit training, Dry Needling, Spinal mobilization, Cryotherapy, Moist heat, Taping, Traction, Ultrasound, Ionotophoresis 70m/ml Dexamethasone, Manual therapy, and Re-evaluation.   PLAN FOR NEXT SESSION:  DN as desired. Continued  lateral/posterior hip strengthening and mobility gains.    MScot Jun PT, DPT, OCS, ATC 05/01/22  8:44 AM

## 2022-05-08 ENCOUNTER — Ambulatory Visit: Payer: BC Managed Care – PPO | Admitting: Physical Therapy

## 2022-05-08 ENCOUNTER — Encounter: Payer: Self-pay | Admitting: Physical Therapy

## 2022-05-08 DIAGNOSIS — R252 Cramp and spasm: Secondary | ICD-10-CM | POA: Diagnosis not present

## 2022-05-08 DIAGNOSIS — R208 Other disturbances of skin sensation: Secondary | ICD-10-CM

## 2022-05-08 DIAGNOSIS — M79604 Pain in right leg: Secondary | ICD-10-CM | POA: Diagnosis not present

## 2022-05-08 DIAGNOSIS — R29898 Other symptoms and signs involving the musculoskeletal system: Secondary | ICD-10-CM | POA: Diagnosis not present

## 2022-05-08 DIAGNOSIS — M6281 Muscle weakness (generalized): Secondary | ICD-10-CM | POA: Diagnosis not present

## 2022-05-08 NOTE — Therapy (Signed)
OUTPATIENT PHYSICAL THERAPY TREATMENT NOTE   Patient Name: Carly Miller MRN: [id number] DOB:10/16/1975, 46 y.o., female Today's Date: 05/08/2022  PCP: Louretta Shorten    REFERRING PROVIDER: Gregor Hams, MD   END OF SESSION:     Past Medical History:  Diagnosis Date   Breast cancer [facility])    Endometriosis    Hematuria    History of radiation therapy    Right Breast- 12/29/21-02/12/22- Dr. Gery Pray   Hyperlipidemia    Mass of ovary    RIGHT   Pelvic pain in female    Urgency of urination    UTI (lower urinary tract infection)    Past Surgical History:  Procedure Laterality Date   ABDOMINAL HYSTERECTOMY N/A 08/31/2013   Procedure: HYSTERECTOMY ABDOMINAL, LYSIS OF ADHESIONS;  Surgeon: Luz Lex, MD;  Location: [facility];  Service: Gynecology;  Laterality: N/A;   ABSCESS DRAINAGE  09/22/1999   RECTAL   ANAL SPHINCTEROTOMY  09/21/1998   BREAST BIOPSY Right 10/27/2021   BREAST LUMPECTOMY WITH RADIOACTIVE SEED AND SENTINEL LYMPH NODE BIOPSY Right 11/25/2021   Procedure: RIGHT BREAST LUMPECTOMY WITH RADIOACTIVE SEED AND SENTINEL LYMPH NODE BIOPSY;  Surgeon: Erroll Luna, MD;  Location: [place];  Service: General;  Laterality: Right;  90 MINUTES ROOM 8   DIAGNOSTIC LAPAROSCOPY LEFT OOPHORECTOMY  08/31/2007   ENDOMETRIOMA   LAPAROSCOPIC CHOLECYSTECTOMY  06/21/2012   LAPAROSCOPY N/A 08/31/2013   Procedure: LAPAROSCOPY DIAGNOSTIC;  Surgeon: Luz Lex, MD;  Location: [facility];  Service: Gynecology;  Laterality: N/A;   Patient Active Problem List   Diagnosis Date Noted   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 12/22/2021   Genetic testing 11/17/2021   Malignant neoplasm of upper-outer quadrant of right breast in female, estrogen receptor positive ([place]) 10/31/2021   S/P TAH (total abdominal hysterectomy) 08/31/2013   TOA (tubo-ovarian abscess) 08/31/2013   Endometrioma of ovary 08/31/2013   Pelvic adhesions 08/31/2013   S/P  removal of right ovary 08/31/2013    REFERRING DIAG: M25.551,G89.29 (ICD-10-CM) - Chronic right hip pain M54.41,G89.29 (ICD-10-CM) - Chronic right-sided low back pain with right-sided sciatica   THERAPY DIAG:  Pain in right leg  Muscle weakness (generalized)  Cramp and spasm  Other symptoms and signs involving the musculoskeletal system  Other disturbances of skin sensation  Rationale for Evaluation and Treatment Rehabilitation  ONSET DATE: 04/07/2022    SUBJECTIVE:  SUBJECTIVE STATEMENT: Pt indicated she is feeling much better and the DN really helped so she would like this again.    PERTINENT HISTORY:  46 y.o. female with right lateral thigh paresthesias and some pain.  Differential includes meralgia paresthetica, hip abductor tendinopathy, or potential lumbar radiculopathy.  Patient does have significant weakness at the hip abductors so we will go ahead and try treating that with physical therapy.  Her symptoms do match meralgia paresthetica but she does not have a lot of risk factors for that condition.  Check back in about 6 weeks.  If not improved with PT and hip abductor strengthening will consider ultrasound-guided diagnostic and potentially therapeutic injection of the lateral femoral cutaneous nerve.  Recheck in about 6 weeks.Marland Kitchen    PAIN:  Currently, 5-6/10 at worst definitely a constant pain      PRECAUTIONS: None   WEIGHT BEARING RESTRICTIONS No   FALLS:  Has patient fallen in last 6 months? No   LIVING ENVIRONMENT: Lives with: lives with their spouse Lives in: House/apartment Stairs: Yes: Internal: 1-2 steps; bilateral but cannot reach both and External: flight steps; bilateral but cannot reach both Has following equipment at home: None   OCCUPATION: auditor    PLOF:  Independent, Independent with basic ADLs, Independent with gait, and Independent with transfers   PATIENT GOALS be able to be more active, reduce pain, potentially get back to running/bike riding      OBJECTIVE:    DIAGNOSTIC FINDINGS:  04/17/2022 FINDINGS: There is no evidence of lumbar spine fracture. Alignment is normal. Intervertebral disc spaces are maintained.   FINDINGS: There is no evidence of hip fracture or dislocation. There is no evidence of arthropathy or other focal bone abnormality.     PATIENT SURVEYS:  04/17/2022 FOTO 57.3   SCREENING FOR RED FLAGS: 04/17/2022 Bowel or bladder incontinence: No Spinal tumors: No Cauda equina syndrome: No Compression fracture: No Abdominal aneurysm: No   COGNITION: 04/17/2022           Overall cognitive status: Within functional limits for tasks assessed                          SENSATION: 04/17/2022 Numbness lateral R thigh    MUSCLE LENGTH: 04/17/2022 Quads moderate limitation B, hip flexors mildly tight, R HS and piriformis quite tight, L HS and piriformis mildly tight; noted L LE about 1/2 than the R LE (able to correct 90% with SI MET)   POSTURE:  04/17/2022 rounded shoulders, forward head, decreased lumbar lordosis, increased thoracic kyphosis, and flexed trunk    PALPATION: 04/17/2022 R quad mm belly tight and tender, lateral R thigh very tight and tender, R glutes and piriformis tender without significant trigger points noted    LUMBAR ROM:    Active  AROM  04/17/2022  Flexion Moderate limitation   Extension Mild limitation   Right lateral flexion WNL   Left lateral flexion WNL  Right rotation    Left rotation     (Blank rows = not tested)   LOWER EXTREMITY ROM:      Active  Right 04/17/2022 Left 04/17/2022  Hip flexion      Hip extension      Hip abduction      Hip adduction      Hip internal rotation      Hip external rotation      Knee flexion      Knee extension  Ankle dorsiflexion       Ankle plantarflexion      Ankle inversion      Ankle eversion       (Blank rows = not tested)   LOWER EXTREMITY MMT:     MMT Right 04/17/2022 Left 04/17/2022 Right 05/01/2022  Hip flexion 3+ 4 5/5  Hip extension 3 3 5/5  Hip abduction 4+ 4+ 4/5  Hip adduction       Hip internal rotation       Hip external rotation       Knee flexion 3 3+   Knee extension 4- 4+   Ankle dorsiflexion 4+ 5   Ankle plantarflexion       Ankle inversion       Ankle eversion        (Blank rows = not tested)   LUMBAR SPECIAL TESTS:  04/17/2022 Straight leg raise test: Negative      TODAY'S TREATMENT  05/08/22 Manual:  Compression to Rt glute med and TFL    Dry Needling:    Twitch response c localized concordant symptoms from Rt glute med.  No adverse reactions noted.  Verbal permission obtained for performance.    Therex:   Supine SLR flexion 2X10 on Rt   Supine bridge c green band around knees 2x 10 3 sec hold   Sidelying clams with green 2X10   Sidelying reverse clams no resistance 2X10   Sidelying Rt hip abduction SLR 2 x 10 total   Standing doorway hip hike c hip abduction into doorway 5 sec hold x 10 bilateral   Standing hip abd red X 15 bilat   Standing hip extension red X 15 bilat  05/01/2022: Manual:  Compression to Rt glute med    Dry Needling:    Twitch response c localized concordant symptoms from Rt glute med.  No adverse reactions noted.  Verbal permission obtained for performance.  Handout provided for education.   Therex:   Review of existing HEP c minor cues for techniques   Supine bridge c green band around knees x 10 3 sec hold   Supine single Rt knee to opposite shoulder 30 sec x 3   Sidelying Rt hip abduction SLR c cues for positioning x 15 total   Standing doorway hip hike c hip abduction into doorway 5 sec hold x 10 bilateral    Education on pain monitoring scale for activity modification based off symptom response.     04/17/2022 SI MET for LLD                         Nustep L3-4 x6 minutes BLEs only             Bridges with red TB around knees 1x10 3 second holds             Supine HS stretch 2x30 seconds B            Piriformis stretch 1x30 seconds B     PATIENT EDUCATION:  05/01/2022 Education details: HEP progression, DN Person educated: Patient Education method: Explanation, Demonstration, and Handouts Education comprehension: verbalized understanding and returned demonstration     HOME EXERCISE PROGRAM: 05/01/2022 Access Code: [email or web address] URL: [email or web address] Date: 05/01/2022 Prepared by: Scot Jun  Exercises - Bridge with Resistance  - 2 x daily - 7 x weekly - 1 sets - 10-15 reps - 3 hold - Supine Hamstring Stretch with Strap  - 2 x daily - 7  x weekly - 1 sets - 3 reps - 30 hold - Seated Figure 4 Piriformis Stretch  - 2 x daily - 7 x weekly - 1 sets - 3 reps - 30 hold - Sidelying Hip Abduction  - 2 x daily - 7 x weekly - 3 sets - 10 reps - Supine Piriformis Stretch with Foot on Ground  - 2 x daily - 7 x weekly - 1 sets - 5 reps - 30 hold - Supine Piriformis Stretch with Leg Straight  - 2 x daily - 7 x weekly - 1 sets - 3-5 reps - 30 hold - Standing Hip Hiking (Mirrored)  - 1-2 x daily - 7 x weekly - 1 sets - 10 reps - 5 hold   ASSESSMENT:   CLINICAL IMPRESSION: HEP and DN appear to be helping greatly with her pain. She does still lack Rt hip strength which was the focus of her session today. PT recommending to continue current plan     OBJECTIVE IMPAIRMENTS Abnormal gait, decreased mobility, difficulty walking, decreased strength, hypomobility, increased fascial restrictions, increased muscle spasms, impaired flexibility, impaired sensation, improper body mechanics, postural dysfunction, and pain.    ACTIVITY LIMITATIONS standing, squatting, stairs, transfers, and locomotion level   PARTICIPATION LIMITATIONS: interpersonal relationship, community activity, occupation, and yard work   PERSONAL FACTORS  Age, Fitness, Past/current experiences, and Time since onset of injury/illness/exacerbation are also affecting patient's functional outcome.    REHAB POTENTIAL: Good   CLINICAL DECISION MAKING: Stable/uncomplicated   EVALUATION COMPLEXITY: Low     GOALS: Goals reviewed with patient? Yes   SHORT TERM GOALS: Target date: 05/15/2022   Will be independent with appropriate progressive HEP  Baseline: Goal status: ongoing - assessed 05/01/2022   2.  Numbness/sx in R LE to have improved by 25%  Baseline:  Goal status:  MET - assessed 05/01/2022   3.  Will have better understanding of ergonomics and general posture Baseline:  Goal status:  ongoing - assessed 05/01/2022   4.  Flexibility in Rt hamstrings, quads, piriformis to have improved by 50%  Baseline:  Goal status:  ongoing - assessed 05/01/2022     LONG TERM GOALS: Target date: 06/12/2022   MMT to have improved by 1 grade in all weak groups  Baseline:  Goal status:  ongoing - assessed 05/01/2022   2.  Numbness/sx in Rt LE to have improved by at least 75%  Baseline:  Goal status:  ongoing - assessed 05/01/2022   3.  Will be able to tolerate at least 30 minutes of gym/machine level exercise without increase in symptoms  Baseline:  Goal status:  ongoing - assessed 05/01/2022   4.  Will be able to perform regular walking program for at least 20 minutes daily without increase in symptoms  Baseline:  Goal status:  ongoing - assessed 05/01/2022       PLAN: PT FREQUENCY: 1x/week   PT DURATION: 8 weeks   PLANNED INTERVENTIONS: Therapeutic exercises, Therapeutic activity, Neuromuscular re-education, Balance training, Gait training, Patient/Family education, Self Care, Joint mobilization, Joint manipulation, Stair training, Orthotic/Fit training, Dry Needling, Spinal mobilization, Cryotherapy, Moist heat, Taping, Traction, Ultrasound, Ionotophoresis 75m/ml Dexamethasone, Manual therapy, and Re-evaluation.   PLAN FOR NEXT SESSION:   DN as desired. Continued lateral/posterior hip strengthening and mobility gains.    BElsie Ra PT, DPT 05/08/22 8:23 AM

## 2022-05-12 ENCOUNTER — Encounter: Payer: Self-pay | Admitting: Adult Health

## 2022-05-15 ENCOUNTER — Other Ambulatory Visit: Payer: Self-pay | Admitting: *Deleted

## 2022-05-15 ENCOUNTER — Encounter: Payer: BC Managed Care – PPO | Admitting: Rehabilitative and Restorative Service Providers"

## 2022-05-15 ENCOUNTER — Telehealth: Payer: Self-pay | Admitting: Hematology and Oncology

## 2022-05-15 MED ORDER — TAMOXIFEN CITRATE 20 MG PO TABS: 20.0000 mg | ORAL_TABLET | Freq: Every day | ORAL | 1 refills | Status: DC

## 2022-05-15 NOTE — Telephone Encounter (Signed)
Contacted patient to scheduled appointments. Patient is aware of appointments that are scheduled.   

## 2022-05-21 ENCOUNTER — Encounter: Payer: Self-pay | Admitting: Physical Therapy

## 2022-05-21 ENCOUNTER — Ambulatory Visit: Payer: BC Managed Care – PPO | Admitting: Physical Therapy

## 2022-05-21 ENCOUNTER — Encounter: Payer: BC Managed Care – PPO | Admitting: Physical Therapy

## 2022-05-21 DIAGNOSIS — M79604 Pain in right leg: Secondary | ICD-10-CM

## 2022-05-21 DIAGNOSIS — R29898 Other symptoms and signs involving the musculoskeletal system: Secondary | ICD-10-CM | POA: Diagnosis not present

## 2022-05-21 DIAGNOSIS — M6281 Muscle weakness (generalized): Secondary | ICD-10-CM

## 2022-05-21 DIAGNOSIS — R252 Cramp and spasm: Secondary | ICD-10-CM | POA: Diagnosis not present

## 2022-05-21 NOTE — Progress Notes (Signed)
   I, Peterson Lombard, LAT, ATC acting as a scribe for Lynne Leader, MD.  Carly Miller is a 46 y.o. female who presents to [place] [facility] today for f/u of R lateral thigh pain and paresthesias w/ hip ABD weakness. Pt was last seen by Dr. Georgina Snell on 04/07/22 and was referred to PT, completing 3 visits. Today, pt reports 2 weeks after she saw [place], pain resolved. Pt has been diligent about doing her HEP and stretches.   Dx imaging: 04/07/22 L-spine & R hip XR  Pertinent review of systems: No fevers or chills  Relevant historical information: History of a breast cancer   Exam:  BP 112/74   Pulse 75   Ht '5\' 6"'$  (1.676 m)   Wt 165 lb 12.8 oz (75.2 kg)   LMP 08/10/2013   SpO2 96%   BMI 26.76 kg/m  General: Well Developed, well nourished, and in no acute distress.   MSK: Normal hip motion normal gait     Assessment and Plan: 46 y.o. female with resolved right lateral hip pain and paresthesia lateral thigh.  Pain was thought to be due to hip abductor tendinopathy and possibly meralgia paresthetica.  Regardless her pain has improved dramatically with physical therapy.  At this point she is managing her exercise on her own with home exercise program taught by PT.  Check back with me as needed.  Precautions reviewed.     Discussed warning signs or symptoms. Please see discharge instructions. Patient expresses understanding.   The above documentation has been reviewed and is accurate and complete Lynne Leader, M.D.

## 2022-05-21 NOTE — Therapy (Signed)
OUTPATIENT PHYSICAL THERAPY TREATMENT NOTE   Patient Name: Carly Miller MRN: [id number] DOB:Aug 17, 1976, 46 y.o., female Today's Date: 05/21/2022  PCP: Louretta Shorten    REFERRING PROVIDER: Gregor Hams, MD   END OF SESSION:   PT End of Session - 05/21/22 1302     Visit Number 4    Number of Visits 4    Date for PT Re-Evaluation 06/12/22    Authorization Type [employer]    PT Start Time 1146    PT Stop Time 1228    PT Time Calculation (min) 42 min    Activity Tolerance Patient tolerated treatment well    Behavior During Therapy WFL for tasks assessed/performed                PHYSICAL THERAPY DISCHARGE SUMMARY  Visits from Start of Care: 4  Current functional level related to goals / functional outcomes: Pt happy with current functional level, only remaining goal yet unmet is postural goal and flexibility goal due to HS tightness. Pain is resolved at this time.    Remaining deficits: See below    Education / Equipment: See below    Patient agrees to discharge. Patient goals were partially met. Patient is being discharged due to being pleased with the current functional level.    Past Medical History:  Diagnosis Date   Breast cancer ([place])    Endometriosis    Hematuria    History of radiation therapy    Right Breast- 12/29/21-02/12/22- Dr. Gery Pray   Hyperlipidemia    Mass of ovary    RIGHT   Pelvic pain in female    Urgency of urination    UTI (lower urinary tract infection)    Past Surgical History:  Procedure Laterality Date   ABDOMINAL HYSTERECTOMY N/A 08/31/2013   Procedure: HYSTERECTOMY ABDOMINAL, LYSIS OF ADHESIONS;  Surgeon: Luz Lex, MD;  Location: [facility];  Service: Gynecology;  Laterality: N/A;   ABSCESS DRAINAGE  09/22/1999   RECTAL   ANAL SPHINCTEROTOMY  09/21/1998   BREAST BIOPSY Right 10/27/2021   BREAST LUMPECTOMY WITH RADIOACTIVE SEED AND SENTINEL LYMPH NODE BIOPSY Right 11/25/2021   Procedure: RIGHT BREAST  LUMPECTOMY WITH RADIOACTIVE SEED AND SENTINEL LYMPH NODE BIOPSY;  Surgeon: Erroll Luna, MD;  Location: [facility];  Service: General;  Laterality: Right;  90 MINUTES ROOM 8   DIAGNOSTIC LAPAROSCOPY LEFT OOPHORECTOMY  08/31/2007   ENDOMETRIOMA   LAPAROSCOPIC CHOLECYSTECTOMY  06/21/2012   LAPAROSCOPY N/A 08/31/2013   Procedure: LAPAROSCOPY DIAGNOSTIC;  Surgeon: Luz Lex, MD;  Location: [facility];  Service: Gynecology;  Laterality: N/A;   Patient Active Problem List   Diagnosis Date Noted   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 12/22/2021   Genetic testing 11/17/2021   Malignant neoplasm of upper-outer quadrant of right breast in female, estrogen receptor positive ([place]) 10/31/2021   S/P TAH (total abdominal hysterectomy) 08/31/2013   TOA (tubo-ovarian abscess) 08/31/2013   Endometrioma of ovary 08/31/2013   Pelvic adhesions 08/31/2013   S/P removal of right ovary 08/31/2013    REFERRING DIAG: M25.551,G89.29 (ICD-10-CM) - Chronic right hip pain M54.41,G89.29 (ICD-10-CM) - Chronic right-sided low back pain with right-sided sciatica   THERAPY DIAG:  Pain in right leg  Muscle weakness (generalized)  Cramp and spasm  Other symptoms and signs involving the musculoskeletal system  Rationale for Evaluation and Treatment Rehabilitation  ONSET DATE: 04/07/2022    SUBJECTIVE:  SUBJECTIVE STATEMENT:  I'm feeling a lot better since starting the HEP, numbness is totally gone, pain is totally gone and I can exercise normally. I've been using B LEs on a MaxiClimber (mix between elliptical and stair climber), walking, etc. The pain isn't bothering me anymore which is great!    PERTINENT HISTORY:  46 y.o. female with right lateral thigh paresthesias and some pain.  Differential  includes meralgia paresthetica, hip abductor tendinopathy, or potential lumbar radiculopathy.  Patient does have significant weakness at the hip abductors so we will go ahead and try treating that with physical therapy.  Her symptoms do match meralgia paresthetica but she does not have a lot of risk factors for that condition.  Check back in about 6 weeks.  If not improved with PT and hip abductor strengthening will consider ultrasound-guided diagnostic and potentially therapeutic injection of the lateral femoral cutaneous nerve.  Recheck in about 6 weeks.Marland Kitchen    PAIN:  Currently 0/10     PRECAUTIONS: None   WEIGHT BEARING RESTRICTIONS No   FALLS:  Has patient fallen in last 6 months? No   LIVING ENVIRONMENT: Lives with: lives with their spouse Lives in: House/apartment Stairs: Yes: Internal: 1-2 steps; bilateral but cannot reach both and External: flight steps; bilateral but cannot reach both Has following equipment at home: None   OCCUPATION: auditor    PLOF: Independent, Independent with basic ADLs, Independent with gait, and Independent with transfers   PATIENT GOALS be able to be more active, reduce pain, potentially get back to running/bike riding      OBJECTIVE:    DIAGNOSTIC FINDINGS:  04/17/2022 FINDINGS: There is no evidence of lumbar spine fracture. Alignment is normal. Intervertebral disc spaces are maintained.   FINDINGS: There is no evidence of hip fracture or dislocation. There is no evidence of arthropathy or other focal bone abnormality.     PATIENT SURVEYS:  04/17/2022 FOTO 57.3   SCREENING FOR RED FLAGS: 04/17/2022 Bowel or bladder incontinence: No Spinal tumors: No Cauda equina syndrome: No Compression fracture: No Abdominal aneurysm: No   COGNITION: 04/17/2022           Overall cognitive status: Within functional limits for tasks assessed                          SENSATION: 04/17/2022 Numbness lateral R thigh    MUSCLE LENGTH: 04/17/2022 Quads  moderate limitation B, hip flexors mildly tight, R HS and piriformis quite tight, L HS and piriformis mildly tight; noted L LE about 1/2 than the R LE (able to correct 90% with SI MET)  05/22/27: L quad moderate limitation, R quad mild limitation; severe limitation B  HS; B piriformis WNL    POSTURE:  04/17/2022 rounded shoulders, forward head, decreased lumbar lordosis, increased thoracic kyphosis, and flexed trunk    PALPATION: 04/17/2022 R quad mm belly tight and tender, lateral R thigh very tight and tender, R glutes and piriformis tender without significant trigger points noted    LUMBAR ROM:    Active  AROM  04/17/2022 AROM 8/31  Flexion Moderate limitation  Mild limitation   Extension Mild limitation  WNL   Right lateral flexion WNL    Left lateral flexion WNL   Right rotation     Left rotation      (Blank rows = not tested)   LOWER EXTREMITY ROM:      Active  Right 04/17/2022 Left 04/17/2022  Hip flexion  Hip extension      Hip abduction      Hip adduction      Hip internal rotation      Hip external rotation      Knee flexion      Knee extension      Ankle dorsiflexion      Ankle plantarflexion      Ankle inversion      Ankle eversion       (Blank rows = not tested)   LOWER EXTREMITY MMT:     MMT Right 04/17/2022 Left 04/17/2022 Right 05/01/2022 Left 8/31 Right 8/31  Hip flexion 3+ 4 5/_0 Hip extension 3 3 5/5 4+ 4+  Hip abduction 4+ 4+ 4/_1 Hip adduction         Hip internal rotation         Hip external rotation         Knee flexion 3 3+  4+ 4+  Knee extension 4- 4+  4+ 4+  Ankle dorsiflexion 4+ 5     Ankle plantarflexion         Ankle inversion         Ankle eversion          (Blank rows = not tested)   LUMBAR SPECIAL TESTS:  04/17/2022 Straight leg raise test: Negative      TODAY'S TREATMENT   05/21/22  Nustep L6 x6 minutes BLEs only   FOTO 98.2   Objective measures/goal review and related education as appropriate   Single leg  bridge x12 B Sidelying hip ABD x12 B red TB  Prone hip extensions x12 B red TB  Quadruped fire hydrants red TB 1x12 B Quadruped hip extension kicks red TB 1x12 B  Long sitting HS stretch 2x60 seconds  Long sitting V HS stretch 1x60 seconds      05/08/22 Manual:  Compression to Rt glute med and TFL    Dry Needling:    Twitch response c localized concordant symptoms from Rt glute med.  No adverse reactions noted.  Verbal permission obtained for performance.    Therex:   Supine SLR flexion 2X10 on Rt   Supine bridge c green band around knees 2x 10 3 sec hold   Sidelying clams with green 2X10   Sidelying reverse clams no resistance 2X10   Sidelying Rt hip abduction SLR 2 x 10 total   Standing doorway hip hike c hip abduction into doorway 5 sec hold x 10 bilateral   Standing hip abd red X 15 bilat   Standing hip extension red X 15 bilat  05/01/2022: Manual:  Compression to Rt glute med    Dry Needling:    Twitch response c localized concordant symptoms from Rt glute med.  No adverse reactions noted.  Verbal permission obtained for performance.  Handout provided for education.   Therex:   Review of existing HEP c minor cues for techniques   Supine bridge c green band around knees x 10 3 sec hold   Supine single Rt knee to opposite shoulder 30 sec x 3   Sidelying Rt hip abduction SLR c cues for positioning x 15 total   Standing doorway hip hike c hip abduction into doorway 5 sec hold x 10 bilateral    Education on pain monitoring scale for activity modification based off symptom response.     04/17/2022 SI MET for LLD  Nustep L3-4 x6 minutes BLEs only             Bridges with red TB around knees 1x10 3 second holds             Supine HS stretch 2x30 seconds B            Piriformis stretch 1x30 seconds B     PATIENT EDUCATION:  05/01/2022 Education details: HEP progression, DN Person educated: Patient Education method: Explanation, Demonstration, and  Handouts Education comprehension: verbalized understanding and returned demonstration     HOME EXERCISE PROGRAM: 05/01/2022 Access Code: [email or web address] URL: [email or web address] Date: 05/01/2022 Prepared by: Scot Jun  Exercises - Bridge with Resistance  - 2 x daily - 7 x weekly - 1 sets - 10-15 reps - 3 hold - Supine Hamstring Stretch with Strap  - 2 x daily - 7 x weekly - 1 sets - 3 reps - 30 hold - Seated Figure 4 Piriformis Stretch  - 2 x daily - 7 x weekly - 1 sets - 3 reps - 30 hold - Sidelying Hip Abduction  - 2 x daily - 7 x weekly - 3 sets - 10 reps - Supine Piriformis Stretch with Foot on Ground  - 2 x daily - 7 x weekly - 1 sets - 5 reps - 30 hold - Supine Piriformis Stretch with Leg Straight  - 2 x daily - 7 x weekly - 1 sets - 3-5 reps - 30 hold - Standing Hip Hiking (Mirrored)  - 1-2 x daily - 7 x weekly - 1 sets - 10 reps - 5 hold   ASSESSMENT:   CLINICAL IMPRESSION:   Carly Miller arrives today doing well, tells me that the problem that sent her to PT has virtually been resolved at this point- able to do what she needs/wants to do exercise wise and is pain free at this point. We took some objective measures and updated POC as necessary. Sees the referring MD tomorrow, hopefully he will be happy with her progress! She has met the majority of her functional goals and is really happy with her improvement at this time, we focused on updating her HEP today and discharged- thank you for the opportunity to participate in her care!    OBJECTIVE IMPAIRMENTS Abnormal gait, decreased mobility, difficulty walking, decreased strength, hypomobility, increased fascial restrictions, increased muscle spasms, impaired flexibility, impaired sensation, improper body mechanics, postural dysfunction, and pain.    ACTIVITY LIMITATIONS standing, squatting, stairs, transfers, and locomotion level   PARTICIPATION LIMITATIONS: interpersonal relationship, community activity, occupation, and  yard work   PERSONAL FACTORS Age, Fitness, Past/current experiences, and Time since onset of injury/illness/exacerbation are also affecting patient's functional outcome.    REHAB POTENTIAL: Good   CLINICAL DECISION MAKING: Stable/uncomplicated   EVALUATION COMPLEXITY: Low     GOALS: Goals reviewed with patient? Yes   SHORT TERM GOALS: Target date: 05/15/2022   Will be independent with appropriate progressive HEP  Baseline: Goal status: MET 8/31   2.  Numbness/sx in R LE to have improved by 25%  Baseline:  Goal status:  MET 8/31   3.  Will have better understanding of ergonomics and general posture Baseline:  Goal status:  ONGOING 8/31   4.  Flexibility in Rt hamstrings, quads, piriformis to have improved by 50%  Baseline:  8/31- hamstrings still very tight  Goal status:  PARTIALLY MET 8/31     LONG TERM GOALS: Target date: 06/12/2022   MMT to have improved by 1  grade in all weak groups  Baseline:  Goal status:  MET 8/31   2.  Numbness/sx in Rt LE to have improved by at least 75%  Baseline:  Goal status:  MET 8/31    3.  Will be able to tolerate at least 30 minutes of gym/machine level exercise without increase in symptoms  Baseline:  Goal status:  MET 8/31   4.  Will be able to perform regular walking program for at least 20 minutes daily without increase in symptoms  Baseline:  Goal status:  MET 8/31       PLAN: PT FREQUENCY: 1x/week   PT DURATION: 8 weeks   PLANNED INTERVENTIONS: Therapeutic exercises, Therapeutic activity, Neuromuscular re-education, Balance training, Gait training, Patient/Family education, Self Care, Joint mobilization, Joint manipulation, Stair training, Orthotic/Fit training, Dry Needling, Spinal mobilization, Cryotherapy, Moist heat, Taping, Traction, Ultrasound, Ionotophoresis 34m/ml Dexamethasone, Manual therapy, and Re-evaluation.   PLAN FOR NEXT SESSION:  N/A- DC today                   Atia Haupt U PT DPT PN2  05/21/2022, 1:03  PM

## 2022-05-22 ENCOUNTER — Ambulatory Visit: Payer: BC Managed Care – PPO | Admitting: Family Medicine

## 2022-05-22 VITALS — BP 112/74 | HR 75 | Ht 66.0 in | Wt 165.8 lb

## 2022-05-22 DIAGNOSIS — G8929 Other chronic pain: Secondary | ICD-10-CM

## 2022-05-22 DIAGNOSIS — M25551 Pain in right hip: Secondary | ICD-10-CM | POA: Diagnosis not present

## 2022-05-22 NOTE — Patient Instructions (Signed)
Thank you for coming in today.   So glad you are feeling better  Check back as needed

## 2022-05-29 ENCOUNTER — Encounter: Payer: BC Managed Care – PPO | Admitting: Rehabilitative and Restorative Service Providers"

## 2022-06-05 ENCOUNTER — Encounter: Payer: BC Managed Care – PPO | Admitting: Rehabilitative and Restorative Service Providers"

## 2022-06-08 ENCOUNTER — Encounter: Payer: Self-pay | Admitting: Physical Therapy

## 2022-06-08 ENCOUNTER — Ambulatory Visit: Payer: BC Managed Care – PPO | Attending: Surgery | Admitting: Physical Therapy

## 2022-06-08 DIAGNOSIS — Z483 Aftercare following surgery for neoplasm: Secondary | ICD-10-CM | POA: Insufficient documentation

## 2022-06-08 NOTE — Therapy (Signed)
OUTPATIENT PHYSICAL THERAPY SOZO SCREENING NOTE   Patient Name: Carly Miller MRN: [id number] DOB:Nov 24, 1975, 46 y.o., female Today's Date: 06/08/2022  PCP: Louretta Shorten, MD REFERRING PROVIDER: Erroll Luna, MD   PT End of Session - 06/08/22 1304     Visit Number 1    Number of Visits 1    PT Start Time 4536    PT Stop Time 4680    PT Time Calculation (min) 10 min    Activity Tolerance Patient tolerated treatment well    Behavior During Therapy [facility] for tasks assessed/performed             Past Medical History:  Diagnosis Date   Breast cancer ([place])    Endometriosis    Hematuria    History of radiation therapy    Right Breast- 12/29/21-02/12/22- Dr. Gery Pray   Hyperlipidemia    Mass of ovary    RIGHT   Pelvic pain in female    Urgency of urination    UTI (lower urinary tract infection)    Past Surgical History:  Procedure Laterality Date   ABDOMINAL HYSTERECTOMY N/A 08/31/2013   Procedure: HYSTERECTOMY ABDOMINAL, LYSIS OF ADHESIONS;  Surgeon: Luz Lex, MD;  Location: [place];  Service: Gynecology;  Laterality: N/A;   ABSCESS DRAINAGE  09/22/1999   RECTAL   ANAL SPHINCTEROTOMY  09/21/1998   BREAST BIOPSY Right 10/27/2021   BREAST LUMPECTOMY WITH RADIOACTIVE SEED AND SENTINEL LYMPH NODE BIOPSY Right 11/25/2021   Procedure: RIGHT BREAST LUMPECTOMY WITH RADIOACTIVE SEED AND SENTINEL LYMPH NODE BIOPSY;  Surgeon: Erroll Luna, MD;  Location: [place];  Service: General;  Laterality: Right;  90 MINUTES ROOM 8   DIAGNOSTIC LAPAROSCOPY LEFT OOPHORECTOMY  08/31/2007   ENDOMETRIOMA   LAPAROSCOPIC CHOLECYSTECTOMY  06/21/2012   LAPAROSCOPY N/A 08/31/2013   Procedure: LAPAROSCOPY DIAGNOSTIC;  Surgeon: Luz Lex, MD;  Location: [facility];  Service: Gynecology;  Laterality: N/A;   Patient Active Problem List   Diagnosis Date Noted   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 12/22/2021   Genetic testing 11/17/2021    Malignant neoplasm of upper-outer quadrant of right breast in female, estrogen receptor positive ([place]) 10/31/2021   S/P TAH (total abdominal hysterectomy) 08/31/2013   TOA (tubo-ovarian abscess) 08/31/2013   Endometrioma of ovary 08/31/2013   Pelvic adhesions 08/31/2013   S/P removal of right ovary 08/31/2013    REFERRING DIAG: right breast cancer at risk for lymphedema  THERAPY DIAG:  Aftercare following surgery for neoplasm  PERTINENT HISTORY: Patient was diagnosed on 10/10/2021 with 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 She underwent a right lumpectomy and sentinel node biopsy (4 negative nodes) on 11/25/2021. It is ER/PR positive and HER2 negative with a Ki67 of < 5%. She had breast implants placed in 2016.  PRECAUTIONS: right UE Lymphedema risk  SUBJECTIVE: Pt here for SOZO screen; no complaints  PAIN:  Are you having pain? No  SOZO SCREENING: Patient was assessed today using the SOZO machine to determine the lymphedema index score. This was compared to her baseline score. It was determined that she is within the recommended range when compared to her baseline and no further action is needed at this time. She will continue SOZO screenings. These are done every 3 months for 2 years post operatively followed by every 6 months for 2 years, and then annually.   L-DEX FLOWSHEETS - 06/08/22 1300       L-DEX LYMPHEDEMA SCREENING   Measurement Type Unilateral  L-DEX MEASUREMENT EXTREMITY Upper Extremity    POSITION  Standing    DOMINANT SIDE Right    At Risk Side Right    BASELINE SCORE (UNILATERAL) 1.7    L-DEX SCORE (UNILATERAL) -2.3    VALUE CHANGE (UNILAT) -[place], [place] 06/08/22 1:07 PM

## 2022-06-12 ENCOUNTER — Encounter: Payer: BC Managed Care – PPO | Admitting: Rehabilitative and Restorative Service Providers"

## 2022-07-16 ENCOUNTER — Inpatient Hospital Stay: Payer: BC Managed Care – PPO | Attending: Hematology and Oncology | Admitting: Hematology and Oncology

## 2022-07-16 ENCOUNTER — Encounter: Payer: Self-pay | Admitting: Hematology and Oncology

## 2022-07-16 VITALS — BP 103/70 | HR 69 | Temp 97.9°F | Resp 16 | Ht 66.0 in | Wt 166.9 lb

## 2022-07-16 DIAGNOSIS — Z79899 Other long term (current) drug therapy: Secondary | ICD-10-CM | POA: Diagnosis not present

## 2022-07-16 DIAGNOSIS — E785 Hyperlipidemia, unspecified: Secondary | ICD-10-CM | POA: Insufficient documentation

## 2022-07-16 DIAGNOSIS — C50411 Malignant neoplasm of upper-outer quadrant of right female breast: Secondary | ICD-10-CM | POA: Insufficient documentation

## 2022-07-16 DIAGNOSIS — Z806 Family history of leukemia: Secondary | ICD-10-CM | POA: Insufficient documentation

## 2022-07-16 DIAGNOSIS — N390 Urinary tract infection, site not specified: Secondary | ICD-10-CM | POA: Diagnosis not present

## 2022-07-16 DIAGNOSIS — Z90710 Acquired absence of both cervix and uterus: Secondary | ICD-10-CM | POA: Diagnosis not present

## 2022-07-16 DIAGNOSIS — Z923 Personal history of irradiation: Secondary | ICD-10-CM | POA: Insufficient documentation

## 2022-07-16 DIAGNOSIS — Z170 Estrogen receptor positive status [ER+]: Secondary | ICD-10-CM | POA: Insufficient documentation

## 2022-07-16 DIAGNOSIS — G4700 Insomnia, unspecified: Secondary | ICD-10-CM | POA: Diagnosis not present

## 2022-07-16 DIAGNOSIS — Z79810 Long term (current) use of selective estrogen receptor modulators (SERMs): Secondary | ICD-10-CM | POA: Insufficient documentation

## 2022-07-16 NOTE — Progress Notes (Signed)
SURVIVORSHIP VISIT:   BRIEF ONCOLOGIC HISTORY:  Oncology History  Malignant neoplasm of upper-outer quadrant of right breast in female, estrogen receptor positive ([place])  10/10/2021 Mammogram   Indeterminate right breast mass at the 11 o'clock position corresponding to the patient, recommend ultrasound-guided biopsy of the, no suspicious right axillary lymphadenopathy.   Targeted ultrasound is performed, showing an irregular, hypoechoic mass with associated vascularity at the 11 o'clock position 2 cm from the nipple. It measures 1.9 x 1.6 x 1.0 cm. This corresponds with the patient's palpable lump. Evaluation of the right axilla demonstrates no suspicious lymphadenopathy.   10/31/2021 Initial Diagnosis   Malignant neoplasm of upper-outer quadrant of right breast in female, estrogen receptor positive ([place])   11/05/2021 Cancer Staging   Staging form: Breast, AJCC 8th Edition - Clinical: Stage IA (cT1c, cN0, cM0, G2, ER+, PR+, HER2-) - Signed by Benay Pike, MD on 11/05/2021 Stage prefix: Initial diagnosis Histologic grading system: 3 grade system    Genetic Testing   [facility] CancerNext-Expanded Panel was Negative. Report date is 11/17/2021.  The CancerNext-Expanded gene panel offered by [facility] and includes sequencing, rearrangement, and RNA analysis for the following 77 genes: AIP, ALK, APC, ATM, AXIN2, BAP1, BARD1, BLM, BMPR1A, BRCA1, BRCA2, BRIP1, CDC73, CDH1, CDK4, CDKN1B, CDKN2A, CHEK2, CTNNA1, DICER1, FANCC, FH, FLCN, GALNT12, KIF1B, LZTR1, MAX, MEN1, MET, MLH1, MSH2, MSH3, MSH6, MUTYH, NBN, NF1, NF2, NTHL1, PALB2, PHOX2B, PMS2, POT1, PRKAR1A, PTCH1, PTEN, RAD51C, RAD51D, RB1, RECQL, RET, SDHA, SDHAF2, SDHB, SDHC, SDHD, SMAD4, SMARCA4, SMARCB1, SMARCE1, STK11, SUFU, TMEM127, TP53, TSC1, TSC2, VHL and XRCC2 (sequencing and deletion/duplication); EGFR, EGLN1, HOXB13, KIT, MITF, PDGFRA, POLD1, and POLE (sequencing only); EPCAM and GREM1 (deletion/duplication only).     Pathology  Results    Biopsy of the area showed IDC, grade 1-2, ER positive 100% moderate staining, PR positive 100% strong staining, Her 2 neg. Ki 5%.   11/25/2021 Surgery   Right breast lumpectomy showed invasive ductal carcinoma, 2 cm, grade 1, intermediate grade DCIS, 4 out of 4 sentinel lymph nodes without any micrometastasis, prior prognostics showed ER 100% positive strong staining, PR 100% positive strong staining, HER2 negative, Ki-67 less than 5%   11/25/2021 Oncotype testing   17/5%   12/29/2021 - 02/12/2022 Radiation Therapy   12/29/2021 through 02/12/2022 Site Technique Total Dose (Gy) Dose per Fx (Gy) Completed Fx Beam Energies  Breast, Right: Breast_R 3D 50.4/50.4 1.8 28/28 6X  Breast, Right: Breast_R_Bst specialPort 10/10 2 5/5 6E     01/2022 -  Anti-estrogen oral therapy   Tamoxifen daily     INTERVAL HISTORY:   Patient is here for follow-up on tamoxifen.  She has been doing really well except for some insomnia.  She takes Ambien 5 mg nightly for management of insomnia.  She has also noticed some word finding difficulty and feeling foggy at times but does not know if this is related to the insomnia versus tamoxifen.  No lower extremity swelling or change in breathing.  No cough or pleuritic chest pain.  She has a mammogram scheduled for first week of November.  Rest of the pertinent 10 point ROS reviewed and negative     REVIEW OF SYSTEMS:  Review of Systems  Constitutional:  Negative for appetite change, chills, fatigue, fever and unexpected weight change.  HENT:   Negative for hearing loss, lump/mass and trouble swallowing.   Eyes:  Negative for eye problems and icterus.  Respiratory:  Negative for chest tightness, cough and shortness of breath.   Cardiovascular:  Negative for chest pain, leg swelling and palpitations.  Gastrointestinal:  Negative for abdominal distention, abdominal pain, constipation, diarrhea, nausea and vomiting.  Endocrine: Positive for hot flashes.   Genitourinary:  Negative for difficulty urinating.   Musculoskeletal:  Negative for arthralgias.  Skin:  Negative for itching and rash.  Neurological:  Negative for dizziness, extremity weakness, headaches and numbness.  Hematological:  Negative for adenopathy. Does not bruise/bleed easily.  Psychiatric/Behavioral:  Positive for sleep disturbance. Negative for depression. The patient is not nervous/anxious.    Breast: Denies any new nodularity, masses, tenderness, nipple changes, or nipple discharge.    ONCOLOGY TREATMENT TEAM:  1. Surgeon:  Dr. Brantley Stage at [facility] 2. Medical Oncologist: Dr. Chryl Heck 3. Radiation Oncologist: Dr. Sondra Come    PAST MEDICAL/SURGICAL HISTORY:  Past Medical History:  Diagnosis Date   Breast cancer [facility])    Endometriosis    Hematuria    History of radiation therapy    Right Breast- 12/29/21-02/12/22- Dr. Gery Pray   Hyperlipidemia    Mass of ovary    RIGHT   Pelvic pain in female    Urgency of urination    UTI (lower urinary tract infection)    Past Surgical History:  Procedure Laterality Date   ABDOMINAL HYSTERECTOMY N/A 08/31/2013   Procedure: HYSTERECTOMY ABDOMINAL, LYSIS OF ADHESIONS;  Surgeon: Luz Lex, MD;  Location: [facility];  Service: Gynecology;  Laterality: N/A;   ABSCESS DRAINAGE  09/22/1999   RECTAL   ANAL SPHINCTEROTOMY  09/21/1998   BREAST BIOPSY Right 10/27/2021   BREAST LUMPECTOMY WITH RADIOACTIVE SEED AND SENTINEL LYMPH NODE BIOPSY Right 11/25/2021   Procedure: RIGHT BREAST LUMPECTOMY WITH RADIOACTIVE SEED AND SENTINEL LYMPH NODE BIOPSY;  Surgeon: Erroll Luna, MD;  Location: [place];  Service: General;  Laterality: Right;  90 MINUTES ROOM 8   DIAGNOSTIC LAPAROSCOPY LEFT OOPHORECTOMY  08/31/2007   ENDOMETRIOMA   LAPAROSCOPIC CHOLECYSTECTOMY  06/21/2012   LAPAROSCOPY N/A 08/31/2013   Procedure: LAPAROSCOPY DIAGNOSTIC;  Surgeon: Luz Lex, MD;  Location: [facility];  Service: Gynecology;  Laterality: N/A;     ALLERGIES:  Allergies  Allergen Reactions   Naproxen Sodium Other (See Comments), Anaphylaxis and Hives    "Feel like I'm choking when I take it" "feels like I'm choking" "Feel like I'm choking when I take it"      CURRENT MEDICATIONS:  Outpatient Encounter Medications as of 07/16/2022  Medication Sig   escitalopram (LEXAPRO) 10 MG tablet Take 10 mg by mouth daily.   Multiple Vitamin (MULTIVITAMIN) capsule Take 1 capsule by mouth daily.   Red Yeast Rice Extract 600 MG TABS Take by mouth. (Patient not taking: Reported on 03/16/2022)   rosuvastatin (CRESTOR) 10 MG tablet Take 10 mg by mouth daily.   tamoxifen (NOLVADEX) 20 MG tablet Take 1 tablet (20 mg total) by mouth daily.   zolpidem (AMBIEN) 10 MG tablet zolpidem 10 mg tablet  TAKE ONE TABLET AT BEDTIME AS NEEDED FOR INSOMNIA   No facility-administered encounter medications on file as of 07/16/2022.     ONCOLOGIC FAMILY HISTORY:  Family History  Problem Relation Age of Onset   Asthma Mother    Thyroid disease Mother    Asthma Father    Diabetes Father    Thyroid disease Father    Kidney disease Sister    Thyroid disease Maternal Aunt    Thyroid disease Maternal Uncle    Leukemia Maternal Grandmother  dx. 57s   Breast cancer Neg Hx      SOCIAL HISTORY:  Social History   Socioeconomic History   Marital status: Married    Spouse name: Not on file   Number of children: Not on file   Years of education: Not on file   Highest education level: Not on file  Occupational History   Not on file  Tobacco Use   Smoking status: Never   Smokeless tobacco: Never  Vaping Use   Vaping Use: Never used  Substance and Sexual Activity   Alcohol use: No   Drug use: No   Sexual activity: Not on file  Other Topics Concern   Not on file  Social History Narrative   Not on file   Social Determinants of Health   Financial Resource Strain: Low Risk   (11/05/2021)   Overall Financial Resource Strain (CARDIA)    Difficulty of Paying Living Expenses: Not hard at all  Food Insecurity: No Food Insecurity (11/05/2021)   Hunger Vital Sign    Worried About Running Out of Food in the Last Year: Never true    [place] in the Last Year: Never true  Transportation Needs: No Transportation Needs (11/05/2021)   PRAPARE - Hydrologist (Medical): No    Lack of Transportation (Non-Medical): No  Physical Activity: Not on file  Stress: Not on file  Social Connections: Not on file  Intimate Partner Violence: Not on file     OBSERVATIONS/OBJECTIVE:  BP 103/70 (BP Location: Left Arm, Patient Position: Sitting)   Pulse 69   Temp 97.9 F (36.6 C) (Temporal)   Resp 16   Ht 5' 6" (1.676 m)   Wt 166 lb 14.4 oz (75.7 kg)   LMP 08/10/2013   SpO2 96%   BMI 26.94 kg/m   Physical Exam Constitutional:      Appearance: Normal appearance.  Chest:     Comments: Bilateral breasts inspected.  No palpable masses.  No regional adenopathy a small area of ecchymosis noted in the right nipple/areola in the inferior outer quadrant Musculoskeletal:     Cervical back: Normal range of motion and neck supple. No rigidity.  Lymphadenopathy:     Cervical: No cervical adenopathy.  Neurological:     Mental Status: She is alert.    LABORATORY DATA:  None for this visit.  DIAGNOSTIC IMAGING:  None for this visit.   ASSESSMENT AND PLAN:   Ms.. Landgrebe is a pleasant 46 y.o. 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    Patient is doing really well on tamoxifen except for some insomnia which is a chronic problem.  She takes Ambien nightly for insomnia.  Other than insomnia, she sometimes feels like it is difficult to focus or concentrate. Physical examination  today without any concerns.  Mammogram scheduled for November 2023. She does not have any appointments with the surgery team.  Hence we have recommended to continue follow-up with [place] every 6 months. Mammogram annually, prefer diagnostic mammogram for at least 2 years followed by screening mammogram.  No concern for DVT or PE today.  She had hysterectomy hence no concern for endometrial toxicity from tamoxifen.  Total time spent: 20 min   *Total Encounter Time as defined by the Centers for Medicare and Medicaid Services includes, in addition to the face-to-face time of a patient  visit (documented in the note above) non-face-to-face time: obtaining and reviewing outside history, ordering and reviewing medications, tests or procedures, care coordination (communications with other health care professionals or caregivers) and documentation in the medical record.

## 2022-07-21 ENCOUNTER — Encounter: Payer: Self-pay | Admitting: Hematology and Oncology

## 2022-07-29 ENCOUNTER — Other Ambulatory Visit: Payer: Self-pay | Admitting: Adult Health

## 2022-07-29 ENCOUNTER — Ambulatory Visit
Admission: RE | Admit: 2022-07-29 | Discharge: 2022-07-29 | Disposition: A | Discharge status: Home / Self Care | Payer: BC Managed Care – PPO | Source: Ambulatory Visit | Attending: Adult Health | Admitting: Adult Health

## 2022-07-29 DIAGNOSIS — Z170 Estrogen receptor positive status [ER+]: Secondary | ICD-10-CM

## 2022-07-29 HISTORY — DX: Personal history of irradiation: Z92.3

## 2022-08-20 ENCOUNTER — Encounter: Payer: Self-pay | Admitting: Internal Medicine

## 2022-09-07 ENCOUNTER — Ambulatory Visit: Payer: BC Managed Care – PPO | Attending: Surgery | Admitting: Physical Therapy

## 2022-09-07 DIAGNOSIS — Z483 Aftercare following surgery for neoplasm: Secondary | ICD-10-CM | POA: Insufficient documentation

## 2022-09-07 NOTE — Therapy (Signed)
OUTPATIENT PHYSICAL THERAPY SOZO SCREENING NOTE   Patient Name: Carly Miller MRN: [id number] DOB:November 26, 1975, 46 y.o., female Today's Date: 09/07/2022  PCP: Louretta Shorten, MD REFERRING PROVIDER: Erroll Luna, MD   PT End of Session - 09/07/22 1252     Visit Number 1    Number of Visits 1    PT Start Time 1601    PT Stop Time 1252    PT Time Calculation (min) 8 min    Activity Tolerance Patient tolerated treatment well    Behavior During Therapy [facility] for tasks assessed/performed             Past Medical History:  Diagnosis Date   Breast cancer ([place])    Endometriosis    Hematuria    History of radiation therapy    Right Breast- 12/29/21-02/12/22- Dr. Gery Pray   Hyperlipidemia    Mass of ovary    RIGHT   Pelvic pain in female    Personal history of radiation therapy    Urgency of urination    UTI (lower urinary tract infection)    Past Surgical History:  Procedure Laterality Date   ABDOMINAL HYSTERECTOMY N/A 08/31/2013   Procedure: HYSTERECTOMY ABDOMINAL, LYSIS OF ADHESIONS;  Surgeon: Luz Lex, MD;  Location: [place];  Service: Gynecology;  Laterality: N/A;   ABSCESS DRAINAGE  09/22/1999   RECTAL   ANAL SPHINCTEROTOMY  09/21/1998   BREAST BIOPSY Right 10/27/2021   BREAST LUMPECTOMY WITH RADIOACTIVE SEED AND SENTINEL LYMPH NODE BIOPSY Right 11/25/2021   Procedure: RIGHT BREAST LUMPECTOMY WITH RADIOACTIVE SEED AND SENTINEL LYMPH NODE BIOPSY;  Surgeon: Erroll Luna, MD;  Location: [place];  Service: General;  Laterality: Right;  90 MINUTES ROOM 8   DIAGNOSTIC LAPAROSCOPY LEFT OOPHORECTOMY  08/31/2007   ENDOMETRIOMA   LAPAROSCOPIC CHOLECYSTECTOMY  06/21/2012   LAPAROSCOPY N/A 08/31/2013   Procedure: LAPAROSCOPY DIAGNOSTIC;  Surgeon: Luz Lex, MD;  Location: [facility];  Service: Gynecology;  Laterality: N/A;   Patient Active Problem List   Diagnosis Date Noted   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  12/22/2021   Genetic testing 11/17/2021   Malignant neoplasm of upper-outer quadrant of right breast in female, estrogen receptor positive ([place]) 10/31/2021   S/P TAH (total abdominal hysterectomy) 08/31/2013   TOA (tubo-ovarian abscess) 08/31/2013   Endometrioma of ovary 08/31/2013   Pelvic adhesions 08/31/2013   S/P removal of right ovary 08/31/2013    REFERRING DIAG: right breast cancer at risk for lymphedema  THERAPY DIAG:  Aftercare following surgery for neoplasm  PERTINENT HISTORY: Patient was diagnosed on 10/10/2021 with 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 She underwent a right lumpectomy and sentinel node biopsy (4 negative nodes) on 11/25/2021. It is ER/PR positive and HER2 negative with a Ki67 of < 5%. She had breast implants placed in 2016.   PRECAUTIONS: right UE Lymphedema risk  SUBJECTIVE: Here for SOZO screen  PAIN:  Are you having pain? No  SOZO SCREENING: Patient was assessed today using the SOZO machine to determine the lymphedema index score. This was compared to her baseline score. It was determined that she is within the recommended range when compared to her baseline and no further action is needed at this time. She will continue SOZO screenings. These are done every 3 months for 2 years post operatively followed by every 6 months for 2 years, and then annually.   L-DEX FLOWSHEETS - 09/07/22 1200       L-DEX LYMPHEDEMA  SCREENING   Measurement Type Unilateral    L-DEX MEASUREMENT EXTREMITY Upper Extremity    POSITION  Standing    DOMINANT SIDE Right    At Risk Side Right    BASELINE SCORE (UNILATERAL) 1.7    L-DEX SCORE (UNILATERAL) -1.9    VALUE CHANGE (UNILAT) -3.6              [name], [place] 09/07/22 12:54 PM

## 2022-09-25 ENCOUNTER — Ambulatory Visit (AMBULATORY_SURGERY_CENTER): Payer: BC Managed Care – PPO | Admitting: *Deleted

## 2022-09-25 VITALS — Ht 66.5 in | Wt 161.0 lb

## 2022-09-25 DIAGNOSIS — Z1211 Encounter for screening for malignant neoplasm of colon: Secondary | ICD-10-CM

## 2022-09-25 MED ORDER — NA SULFATE-K SULFATE-MG SULF 17.5-3.13-1.6 GM/177ML PO SOLN: 1.0000 | Freq: Once | ORAL | 0 refills | Status: AC

## 2022-09-25 NOTE — Progress Notes (Unsigned)
No egg or soy allergy known to patient  No issues known to pt with past sedation with any surgeries or procedures Patient denies ever being told they had issues or difficulty with intubation  No FH of Malignant Hyperthermia Pt is not on diet pills Pt is not on  home 02  Pt is not on blood thinners  Pt denies issues with constipation  Pt is not on dialysis Pt denies any upcoming cardiac testing Pt encouraged to use to use Singlecare or Goodrx to reduce cost  Patient's chart reviewed by Osvaldo Angst CNRA prior to previsit and patient appropriate for the [place].  Previsit completed and red dot placed by patient's name on their procedure day (on provider's schedule).  . Visit by phone Instructions sent by mail with coupon and by my chart

## 2022-10-07 ENCOUNTER — Encounter: Payer: Self-pay | Admitting: *Deleted

## 2022-10-07 ENCOUNTER — Telehealth: Payer: Self-pay | Admitting: Internal Medicine

## 2022-10-07 NOTE — Telephone Encounter (Signed)
Spoke with pt and clarified issue. Pt instructed on coupon Member ID listed is for the coupon. Letters show instructions sent were for sorrect patient. Patient stated issue istaken care of and will follow instructions. Instructions resent to patient verifying patients address and name on instructions.

## 2022-10-07 NOTE — Telephone Encounter (Signed)
Patient called, stated the wrong instructions were sent to her. She received another patient's instructions and coupon. I have mailed the letter to her but patient would like coupon sent to her for the prep medication.   Thank you.

## 2022-10-13 ENCOUNTER — Encounter: Payer: Self-pay | Admitting: Internal Medicine

## 2022-10-16 ENCOUNTER — Encounter: Payer: Self-pay | Admitting: Internal Medicine

## 2022-10-16 ENCOUNTER — Ambulatory Visit (AMBULATORY_SURGERY_CENTER): Payer: BC Managed Care – PPO | Admitting: Internal Medicine

## 2022-10-16 VITALS — BP 112/66 | HR 83 | Temp 97.8°F | Resp 12 | Ht 66.5 in | Wt 161.0 lb

## 2022-10-16 DIAGNOSIS — Z1211 Encounter for screening for malignant neoplasm of colon: Secondary | ICD-10-CM | POA: Diagnosis present

## 2022-10-16 MED ORDER — SODIUM CHLORIDE 0.9 % IV SOLN: 500.0000 mL | Freq: Once | INTRAVENOUS | Status: DC

## 2022-10-16 NOTE — Op Note (Signed)
[place] Patient Name: Carly Miller Procedure Date: 10/16/2022 8:25 AM MRN: [id number] Endoscopist: Docia Chuck. Henrene Pastor , MD, [id number] Age: 47 Referring MD:  Date of Birth: July 29, 1976 Gender: Female Account #: [id number] Procedure:                Colonoscopy Indications:              Screening for colorectal malignant neoplasm Medicines:                Monitored Anesthesia Care Procedure:                Pre-Anesthesia Assessment:                           - Prior to the procedure, a History and Physical                            was performed, and patient medications and                            allergies were reviewed. The patient's tolerance of                            previous anesthesia was also reviewed. The risks                            and benefits of the procedure and the sedation                            options and risks were discussed with the patient.                            All questions were answered, and informed consent                            was obtained. Prior Anticoagulants: The patient has                            taken no anticoagulant or antiplatelet agents. ASA                            Grade Assessment: II - A patient with mild systemic                            disease. After reviewing the risks and benefits,                            the patient was deemed in satisfactory condition to                            undergo the procedure.                           After obtaining informed consent, the colonoscope  was passed under direct vision. Throughout the                            procedure, the patient's blood pressure, pulse, and                            oxygen saturations were monitored continuously. The                            CF [id number] #[id number] was introduced through the anus                            and advanced to the the cecum, identified by                            appendiceal orifice  and ileocecal valve. The                            ileocecal valve, appendiceal orifice, and rectum                            were photographed. The quality of the bowel                            preparation was excellent. The colonoscopy was                            performed without difficulty. The patient tolerated                            the procedure well. The bowel preparation used was                            SUPREP via split dose instruction. Scope In: 8:38:30 AM Scope Out: 8:52:19 AM Scope Withdrawal Time: 0 hours 9 minutes 47 seconds  Total Procedure Duration: 0 hours 13 minutes 49 seconds  Findings:                 The entire examined colon appeared normal on direct                            and retroflexion views. Incidental isolated                            diverticulum in the descending colon noted. Complications:            No immediate complications. Estimated blood loss:                            None. Estimated Blood Loss:     Estimated blood loss: none. Impression:               - The entire examined colon is normal on direct and  retroflexion views, save one isolated left-sided                            diverticulum.                           - No specimens collected. Recommendation:           - Repeat colonoscopy in 10 years for screening                            purposes.                           - Patient has a contact number available for                            emergencies. The signs and symptoms of potential                            delayed complications were discussed with the                            patient. Return to normal activities tomorrow.                            Written discharge instructions were provided to the                            patient.                           - Resume previous diet.                           - Continue present medications. Docia Chuck. Henrene Pastor, MD 10/16/2022 9:07:00  AM This report has been signed electronically.

## 2022-10-16 NOTE — Patient Instructions (Signed)
Discharge instructions given. Normal exam. Resume previous medications. YOU HAD AN ENDOSCOPIC PROCEDURE TODAY AT THE [facility]:   Refer to the procedure report that was given to you for any specific questions about what was found during the examination.  If the procedure report does not answer your questions, please call your gastroenterologist to clarify.  If you requested that your care partner not be given the details of your procedure findings, then the procedure report has been included in a sealed envelope for you to review at your convenience later.  YOU SHOULD EXPECT: Some feelings of bloating in the abdomen. Passage of more gas than usual.  Walking can help get rid of the air that was put into your GI tract during the procedure and reduce the bloating. If you had a lower endoscopy (such as a colonoscopy or flexible sigmoidoscopy) you may notice spotting of blood in your stool or on the toilet paper. If you underwent a bowel prep for your procedure, you may not have a normal bowel movement for a few days.  Please Note:  You might notice some irritation and congestion in your nose or some drainage.  This is from the oxygen used during your procedure.  There is no need for concern and it should clear up in a day or so.  SYMPTOMS TO REPORT IMMEDIATELY:  Following lower endoscopy (colonoscopy or flexible sigmoidoscopy):  Excessive amounts of blood in the stool  Significant tenderness or worsening of abdominal pains  Swelling of the abdomen that is new, acute  Fever of 100F or higher   For urgent or emergent issues, a gastroenterologist can be reached at any hour by calling [phone]. Do not use MyChart messaging for urgent concerns.    DIET:  We do recommend a small meal at first, but then you may proceed to your regular diet.  Drink plenty of fluids but you should avoid alcoholic beverages for 24 hours.  ACTIVITY:  You should plan to take it easy for the rest of  today and you should NOT DRIVE or use heavy machinery until tomorrow (because of the sedation medicines used during the test).    FOLLOW UP: Our staff will call the number listed on your records the next business day following your procedure.  We will call around 7:15- 8:00 am to check on you and address any questions or concerns that you may have regarding the information given to you following your procedure. If we do not reach you, we will leave a message.     If any biopsies were taken you will be contacted by phone or by letter within the next 1-3 weeks.  Please call us at [phone] if you have not heard about the biopsies in 3 weeks.    SIGNATURES/CONFIDENTIALITY: You and/or your care partner have signed paperwork which will be entered into your electronic medical record.  These signatures attest to the fact that that the information above on your After Visit Summary has been reviewed and is understood.  Full responsibility of the confidentiality of this discharge information lies with you and/or your care-partner. 

## 2022-10-16 NOTE — Progress Notes (Signed)
HISTORY OF PRESENT ILLNESS:  Carly Miller is a 47 y.o. female who presents today for routine screening colonoscopy.  No complaints  REVIEW OF SYSTEMS:  All non-GI ROS negative except for  Past Medical History:  Diagnosis Date   Breast cancer ([place])    Endometriosis    Hematuria    History of radiation therapy    Right Breast- 12/29/21-02/12/22- Dr. Gery Pray   Hyperlipidemia    Mass of ovary    RIGHT   Pelvic pain in female    Personal history of radiation therapy    Urgency of urination    UTI (lower urinary tract infection)     Past Surgical History:  Procedure Laterality Date   ABDOMINAL HYSTERECTOMY N/A 08/31/2013   Procedure: HYSTERECTOMY ABDOMINAL, LYSIS OF ADHESIONS;  Surgeon: Luz Lex, MD;  Location: [facility];  Service: Gynecology;  Laterality: N/A;   ABSCESS DRAINAGE  09/22/1999   RECTAL   ANAL SPHINCTEROTOMY  09/21/1998   BREAST BIOPSY Right 10/27/2021   BREAST LUMPECTOMY WITH RADIOACTIVE SEED AND SENTINEL LYMPH NODE BIOPSY Right 11/25/2021   Procedure: RIGHT BREAST LUMPECTOMY WITH RADIOACTIVE SEED AND SENTINEL LYMPH NODE BIOPSY;  Surgeon: Erroll Luna, MD;  Location: [place];  Service: General;  Laterality: Right;  90 MINUTES ROOM 8   DIAGNOSTIC LAPAROSCOPY LEFT OOPHORECTOMY  08/31/2007   ENDOMETRIOMA   LAPAROSCOPIC CHOLECYSTECTOMY  06/21/2012   LAPAROSCOPY N/A 08/31/2013   Procedure: LAPAROSCOPY DIAGNOSTIC;  Surgeon: Luz Lex, MD;  Location: [facility];  Service: Gynecology;  Laterality: N/A;    Social History Carly Miller  reports that she has never smoked. She has never used smokeless tobacco. She reports that she does not drink alcohol and does not use drugs.  family history includes Asthma in her father and mother; Diabetes in her father; Kidney disease in her sister; Leukemia in her maternal grandmother; Thyroid disease in her father, maternal aunt, maternal uncle, and mother.  Allergies   Allergen Reactions   Naproxen Sodium Other (See Comments), Anaphylaxis and Hives    "Feel like I'm choking when I take it" "feels like I'm choking" "Feel like I'm choking when I take it"    Naproxen Other (See Comments)       PHYSICAL EXAMINATION: Vital signs: BP (!) 104/47   Pulse 79   Temp 97.8 F (36.6 C) (Temporal)   Resp 12   Ht 5' 6.5" (1.689 m)   Wt 161 lb (73 kg)   LMP 08/10/2013   SpO2 99%   BMI 25.60 kg/m  General: Well-developed, well-nourished, no acute distress HEENT: Sclerae are anicteric, conjunctiva pink. Oral mucosa intact Lungs: Clear Heart: Regular Abdomen: soft, nontender, nondistended, no obvious ascites, no peritoneal signs, normal bowel sounds. No organomegaly. Extremities: No edema Psychiatric: alert and oriented x3. Cooperative     ASSESSMENT:  Colon cancer screening   PLAN:   Screening colonoscopy

## 2022-10-16 NOTE — Progress Notes (Signed)
Report to PACU, RN, vss, BBS= Clear.  

## 2022-10-16 NOTE — Progress Notes (Signed)
Pt's states no medical or surgical changes since previsit or office visit. 

## 2022-10-19 ENCOUNTER — Telehealth: Payer: Self-pay

## 2022-10-19 NOTE — Telephone Encounter (Signed)
Follow up call placed, VM obtained and message left. 

## 2022-12-03 ENCOUNTER — Ambulatory Visit (HOSPITAL_COMMUNITY)
Admission: RE | Admit: 2022-12-03 | Discharge: 2022-12-03 | Disposition: A | Discharge status: Home / Self Care | Payer: BC Managed Care – PPO | Source: Ambulatory Visit | Attending: Internal Medicine | Admitting: Internal Medicine

## 2022-12-03 ENCOUNTER — Other Ambulatory Visit (HOSPITAL_COMMUNITY): Payer: Self-pay | Admitting: Internal Medicine

## 2022-12-03 DIAGNOSIS — M79604 Pain in right leg: Secondary | ICD-10-CM | POA: Insufficient documentation

## 2022-12-04 ENCOUNTER — Other Ambulatory Visit (HOSPITAL_COMMUNITY): Payer: BC Managed Care – PPO

## 2022-12-07 ENCOUNTER — Encounter: Payer: Self-pay | Admitting: Physical Therapy

## 2022-12-07 ENCOUNTER — Ambulatory Visit: Payer: BC Managed Care – PPO | Attending: Surgery | Admitting: Physical Therapy

## 2022-12-07 DIAGNOSIS — Z483 Aftercare following surgery for neoplasm: Secondary | ICD-10-CM | POA: Insufficient documentation

## 2022-12-07 NOTE — Therapy (Signed)
OUTPATIENT PHYSICAL THERAPY SOZO SCREENING NOTE   Patient Name: Carly Miller MRN: [id number] DOB:[birth year], 47 y.o., female Today's Date: 12/07/2022  PCP: Louretta Shorten, MD REFERRING PROVIDER: Erroll Luna, MD   PT End of Session - 12/07/22 1259     Visit Number 1    Number of Visits 1    PT Start Time 1253    PT Stop Time 1300    PT Time Calculation (min) 7 min    Activity Tolerance Patient tolerated treatment well    Behavior During Therapy [facility] for tasks assessed/performed             Past Medical History:  Diagnosis Date   Breast cancer ([place])    Endometriosis    Hematuria    History of radiation therapy    Right Breast- 12/29/21-02/12/22- Dr. Gery Pray   Hyperlipidemia    Mass of ovary    RIGHT   Pelvic pain in female    Personal history of radiation therapy    Urgency of urination    UTI (lower urinary tract infection)    Past Surgical History:  Procedure Laterality Date   ABDOMINAL HYSTERECTOMY N/A 08/31/2013   Procedure: HYSTERECTOMY ABDOMINAL, LYSIS OF ADHESIONS;  Surgeon: Luz Lex, MD;  Location: [place];  Service: Gynecology;  Laterality: N/A;   ABSCESS DRAINAGE  09/22/1999   RECTAL   ANAL SPHINCTEROTOMY  09/21/1998   BREAST BIOPSY Right 10/27/2021   BREAST LUMPECTOMY WITH RADIOACTIVE SEED AND SENTINEL LYMPH NODE BIOPSY Right 11/25/2021   Procedure: RIGHT BREAST LUMPECTOMY WITH RADIOACTIVE SEED AND SENTINEL LYMPH NODE BIOPSY;  Surgeon: Erroll Luna, MD;  Location: [place];  Service: General;  Laterality: Right;  90 MINUTES ROOM 8   DIAGNOSTIC LAPAROSCOPY LEFT OOPHORECTOMY  08/31/2007   ENDOMETRIOMA   LAPAROSCOPIC CHOLECYSTECTOMY  06/21/2012   LAPAROSCOPY N/A 08/31/2013   Procedure: LAPAROSCOPY DIAGNOSTIC;  Surgeon: Luz Lex, MD;  Location: [facility];  Service: Gynecology;  Laterality: N/A;   Patient Active Problem List   Diagnosis Date Noted   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  12/22/2021   Genetic testing 11/17/2021   Malignant neoplasm of upper-outer quadrant of right breast in female, estrogen receptor positive ([place]) 10/31/2021   S/P TAH (total abdominal hysterectomy) 08/31/2013   TOA (tubo-ovarian abscess) 08/31/2013   Endometrioma of ovary 08/31/2013   Pelvic adhesions 08/31/2013   S/P removal of right ovary 08/31/2013    REFERRING DIAG: right breast cancer at risk for lymphedema  THERAPY DIAG:  Aftercare following surgery for neoplasm  PERTINENT HISTORY: Patient was diagnosed on 10/10/2021 with 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 She underwent a right lumpectomy and sentinel node biopsy (4 negative nodes) on 11/25/2021. It is ER/PR positive and HER2 negative with a Ki67 of < 5%. She had breast implants placed in 2016.   PRECAUTIONS: right UE Lymphedema risk  SUBJECTIVE: Here for SOZO screen; doing fine  PAIN:  Are you having pain? No  SOZO SCREENING: Patient was assessed today using the SOZO machine to determine the lymphedema index score. This was compared to her baseline score. It was determined that she is within the recommended range when compared to her baseline and no further action is needed at this time. She will continue SOZO screenings. These are done every 3 months for 2 years post operatively followed by every 6 months for 2 years, and then annually.   L-DEX FLOWSHEETS - 12/07/22 1300  L-DEX LYMPHEDEMA SCREENING   Measurement Type Unilateral    L-DEX MEASUREMENT EXTREMITY Upper Extremity    POSITION  Standing    DOMINANT SIDE Right    At Risk Side Right    BASELINE SCORE (UNILATERAL) 1.7    L-DEX SCORE (UNILATERAL) -2.1    VALUE CHANGE (UNILAT) -3.8              [name], [place] 12/07/22 1:03 PM

## 2023-01-15 ENCOUNTER — Encounter: Payer: Self-pay | Admitting: Hematology and Oncology

## 2023-01-15 ENCOUNTER — Inpatient Hospital Stay: Payer: BC Managed Care – PPO | Attending: Hematology and Oncology | Admitting: Hematology and Oncology

## 2023-01-15 VITALS — BP 101/66 | HR 77 | Temp 97.9°F | Resp 16 | Ht 66.0 in | Wt 167.1 lb

## 2023-01-15 DIAGNOSIS — Z79810 Long term (current) use of selective estrogen receptor modulators (SERMs): Secondary | ICD-10-CM | POA: Diagnosis not present

## 2023-01-15 DIAGNOSIS — N951 Menopausal and female climacteric states: Secondary | ICD-10-CM | POA: Insufficient documentation

## 2023-01-15 DIAGNOSIS — C50411 Malignant neoplasm of upper-outer quadrant of right female breast: Secondary | ICD-10-CM | POA: Diagnosis not present

## 2023-01-15 DIAGNOSIS — Z170 Estrogen receptor positive status [ER+]: Secondary | ICD-10-CM | POA: Insufficient documentation

## 2023-01-15 MED ORDER — OXYBUTYNIN CHLORIDE 5 MG PO TABS: 2.5000 mg | ORAL_TABLET | Freq: Every day | ORAL | 1 refills | Status: DC

## 2023-01-15 NOTE — Progress Notes (Signed)
BRIEF ONCOLOGIC HISTORY:  Oncology History  Malignant neoplasm of upper-outer quadrant of right breast in female, estrogen receptor positive (HCC)  10/10/2021 Mammogram   Indeterminate right breast mass at the 11 o'clock position corresponding to the patient, recommend ultrasound-guided biopsy of the, no suspicious right axillary lymphadenopathy.   Targeted ultrasound is performed, showing an irregular, hypoechoic mass with associated vascularity at the 11 o'clock position 2 cm from the nipple. It measures 1.9 x 1.6 x 1.0 cm. This corresponds with the patient's palpable lump. Evaluation of the right axilla demonstrates no suspicious lymphadenopathy.   10/31/2021 Initial Diagnosis   Malignant neoplasm of upper-outer quadrant of right breast in female, estrogen receptor positive (HCC)   11/05/2021 Cancer Staging   Staging form: Breast, AJCC 8th Edition - Clinical: Stage IA (cT1c, cN0, cM0, G2, ER+, PR+, HER2-) - Signed by Rachel Moulds, MD on 11/05/2021 Stage prefix: Initial diagnosis Histologic grading system: 3 grade system    Genetic Testing   [facility] CancerNext-Expanded Panel was Negative. Report date is 11/17/2021.  The CancerNext-Expanded gene panel offered by [facility] and includes sequencing, rearrangement, and RNA analysis for the following 77 genes: AIP, ALK, APC, ATM, AXIN2, BAP1, BARD1, BLM, BMPR1A, BRCA1, BRCA2, BRIP1, CDC73, CDH1, CDK4, CDKN1B, CDKN2A, CHEK2, CTNNA1, DICER1, FANCC, FH, FLCN, GALNT12, KIF1B, LZTR1, MAX, MEN1, MET, MLH1, MSH2, MSH3, MSH6, MUTYH, NBN, NF1, NF2, NTHL1, PALB2, PHOX2B, PMS2, POT1, PRKAR1A, PTCH1, PTEN, RAD51C, RAD51D, RB1, RECQL, RET, SDHA, SDHAF2, SDHB, SDHC, SDHD, SMAD4, SMARCA4, SMARCB1, SMARCE1, STK11, SUFU, TMEM127, TP53, TSC1, TSC2, VHL and XRCC2 (sequencing and deletion/duplication); EGFR, EGLN1, HOXB13, KIT, MITF, PDGFRA, POLD1, and POLE (sequencing only); EPCAM and GREM1 (deletion/duplication only).     Pathology Results    Biopsy of  the area showed IDC, grade 1-2, ER positive 100% moderate staining, PR positive 100% strong staining, Her 2 neg. Ki 5%.   11/25/2021 Surgery   Right breast lumpectomy showed invasive ductal carcinoma, 2 cm, grade 1, intermediate grade DCIS, 4 out of 4 sentinel lymph nodes without any micrometastasis, prior prognostics showed ER 100% positive strong staining, PR 100% positive strong staining, HER2 negative, Ki-67 less than 5%   11/25/2021 Oncotype testing   17/5%   12/29/2021 - 02/12/2022 Radiation Therapy   12/29/2021 through 02/12/2022 Site Technique Total Dose (Gy) Dose per Fx (Gy) Completed Fx Beam Energies  Breast, Right: Breast_R 3D 50.4/50.4 1.8 28/28 6X  Breast, Right: Breast_R_Bst specialPort 10/10 2 5/5 6E    01/2022 -  Anti-estrogen oral therapy   Tamoxifen daily     INTERVAL HISTORY:   Patient is here for follow-up on tamoxifen.  She has been doing really well except for some insomnia.  She takes Ambien 5 mg nightly for management of insomnia.   Mammogram Nov 2023 no evidence of malignancy involving either breast.She continues to experience hot flashes and mental fogginess and was wondering if she can take something for this. No breast changes reported.  Rest of the pertinent 10 point ROS reviewed and negative     REVIEW OF SYSTEMS:  Review of Systems  Constitutional:  Negative for appetite change, chills, fatigue, fever and unexpected weight change.  HENT:   Negative for hearing loss, lump/mass and trouble swallowing.   Eyes:  Negative for eye problems and icterus.  Respiratory:  Negative for chest tightness, cough and shortness of breath.   Cardiovascular:  Negative for chest pain, leg swelling and palpitations.  Gastrointestinal:  Negative for abdominal distention, abdominal pain, constipation, diarrhea, nausea and vomiting.  Endocrine:  Positive for hot flashes.  Genitourinary:  Negative for difficulty urinating.   Musculoskeletal:  Negative for arthralgias.  Skin:   Negative for itching and rash.  Neurological:  Negative for dizziness, extremity weakness, headaches and numbness.  Hematological:  Negative for adenopathy. Does not bruise/bleed easily.  Psychiatric/Behavioral:  Positive for sleep disturbance. Negative for depression. The patient is not nervous/anxious.    Breast: Denies any new nodularity, masses, tenderness, nipple changes, or nipple discharge.    ONCOLOGY TREATMENT TEAM:  1. Surgeon:  Dr. Luisa Hart at [facility] 2. Medical Oncologist: Dr. Al Pimple 3. Radiation Oncologist: Dr. Roselind Messier    PAST MEDICAL/SURGICAL HISTORY:  Past Medical History:  Diagnosis Date   Breast cancer [facility])    Endometriosis    Hematuria    History of radiation therapy    Right Breast- 12/29/21-02/12/22- Dr. Antony Blackbird   Hyperlipidemia    Mass of ovary    RIGHT   Pelvic pain in female    Personal history of radiation therapy    Urgency of urination    UTI (lower urinary tract infection)    Past Surgical History:  Procedure Laterality Date   ABDOMINAL HYSTERECTOMY N/A 08/31/2013   Procedure: HYSTERECTOMY ABDOMINAL, LYSIS OF ADHESIONS;  Surgeon: Turner Daniels, MD;  Location: [facility];  Service: Gynecology;  Laterality: N/A;   ABSCESS DRAINAGE  09/22/1999   RECTAL   ANAL SPHINCTEROTOMY  09/21/1998   BREAST BIOPSY Right 10/27/2021   BREAST LUMPECTOMY WITH RADIOACTIVE SEED AND SENTINEL LYMPH NODE BIOPSY Right 11/25/2021   Procedure: RIGHT BREAST LUMPECTOMY WITH RADIOACTIVE SEED AND SENTINEL LYMPH NODE BIOPSY;  Surgeon: Harriette Bouillon, MD;  Location: [facility];  Service: General;  Laterality: Right;  90 MINUTES ROOM 8   DIAGNOSTIC LAPAROSCOPY LEFT OOPHORECTOMY  08/31/2007   ENDOMETRIOMA   LAPAROSCOPIC CHOLECYSTECTOMY  06/21/2012   LAPAROSCOPY N/A 08/31/2013   Procedure: LAPAROSCOPY DIAGNOSTIC;  Surgeon: Turner Daniels, MD;  Location: [facility];  Service: Gynecology;  Laterality: N/A;      ALLERGIES:  Allergies  Allergen Reactions   Naproxen Sodium Other (See Comments), Anaphylaxis and Hives    "Feel like I'm choking when I take it" "feels like I'm choking" "Feel like I'm choking when I take it"    Naproxen Other (See Comments)     CURRENT MEDICATIONS:  Outpatient Encounter Medications as of 01/15/2023  Medication Sig   escitalopram (LEXAPRO) 10 MG tablet Take 10 mg by mouth daily.   Multiple Vitamin (MULTIVITAMIN) capsule Take 1 capsule by mouth daily.   rosuvastatin (CRESTOR) 10 MG tablet Take 5 mg by mouth daily.   rosuvastatin (CRESTOR) 5 MG tablet Take 5 mg by mouth at bedtime.   tamoxifen (NOLVADEX) 20 MG tablet Take 1 tablet (20 mg total) by mouth daily.   zolpidem (AMBIEN) 10 MG tablet zolpidem 10 mg tablet  TAKE ONE TABLET AT BEDTIME AS NEEDED FOR INSOMNIA   No facility-administered encounter medications on file as of 01/15/2023.     ONCOLOGIC FAMILY HISTORY:  Family History  Problem Relation Age of Onset   Asthma Mother    Thyroid disease Mother    Asthma Father    Diabetes Father    Thyroid disease Father    Kidney disease Sister    Thyroid disease Maternal Aunt    Thyroid disease Maternal Uncle    Leukemia Maternal Grandmother        dx. 66s   Breast cancer Neg Hx    Colon cancer  Neg Hx    Colon polyps Neg Hx    Esophageal cancer Neg Hx    Stomach cancer Neg Hx    Rectal cancer Neg Hx      SOCIAL HISTORY:  Social History   Socioeconomic History   Marital status: Married    Spouse name: Not on file   Number of children: Not on file   Years of education: Not on file   Highest education level: Not on file  Occupational History   Not on file  Tobacco Use   Smoking status: Never   Smokeless tobacco: Never  Vaping Use   Vaping Use: Never used  Substance and Sexual Activity   Alcohol use: No   Drug use: No   Sexual activity: Not on file  Other Topics Concern   Not on file  Social History Narrative   Not on file    Social Determinants of Health   Financial Resource Strain: Low Risk  (11/05/2021)   Overall Financial Resource Strain (CARDIA)    Difficulty of Paying Living Expenses: Not hard at all  Food Insecurity: No Food Insecurity (11/05/2021)   Hunger Vital Sign    Worried About Running Out of Food in the Last Year: Never true    Ran Out of Food in the Last Year: Never true  Transportation Needs: No Transportation Needs (11/05/2021)   PRAPARE - Administrator, Civil Service (Medical): No    Lack of Transportation (Non-Medical): No  Physical Activity: Not on file  Stress: Not on file  Social Connections: Not on file  Intimate Partner Violence: Not on file     OBSERVATIONS/OBJECTIVE:  BP 101/66 (BP Location: Left Arm, Patient Position: Sitting)   Pulse 77   Temp 97.9 F (36.6 C) (Temporal)   Resp 16   Ht 5\' 6"  (1.676 m)   Wt 167 lb 1.6 oz (75.8 kg)   LMP 08/10/2013   SpO2 100%   BMI 26.97 kg/m   Physical Exam Constitutional:      Appearance: Normal appearance.  Chest:     Comments: Bilateral breasts inspected.  No palpable masses.  No regional adenopathy a small area of ecchymosis noted in the right nipple/areola in the inferior outer quadrant, no change since last visit. Musculoskeletal:     Cervical back: Normal range of motion and neck supple. No rigidity.  Lymphadenopathy:     Cervical: No cervical adenopathy.  Neurological:     Mental Status: She is alert.    LABORATORY DATA:  None for this visit.  DIAGNOSTIC IMAGING:  None for this visit.   ASSESSMENT AND PLAN:   Malignant neoplasm of upper-outer quadrant of right breast in female, estrogen receptor positive (HCC) This is a very pleasant 47 yr old premenopausal female patient with T1cN0M0 Grade 1-2 ER PR strongly positive Her 2 negative IDC as well as grade 2 DCIS referred to Breast [facility] for consideration of adjuvant therapy. Given small tumor and strongly ER PR positive and Her 2 neg, we discussed with  upfront surgery followed by oncotype testing. She had right breast lumpectomy with axillary sentinel lymph node mapping.  Pathology from lumpectomy showed invasive ductal carcinoma, grade 1 measuring 2 cm, DCIS, intermediate grade, 4 out of 4 sentinel lymph nodes negative.  Prognostic showed ER 100% positive strong staining intensity, PR 100% positive strong staining intensity, HER2 negative and Ki-67 of 5%. Oncotype score of 17, no added benefit of chemotherapy. She completed adjuvant radiation and is now on tamoxifen. She  is tolerating it well overall. ROS pertinent for mental fogginess and hot flashes. We will try some oxybutinin for hot flashes ( she is on lexapro and effexor has some interactions with this) She also takes ambien nightly hence didn't want to add gabapentin. She can take oxybutynin 2.5 mg daily. RTC in 6 months Mammogram Nov 2024.   No concern for DVT or PE today. She understands the symptoms and signs of DVT/PE  She had hysterectomy hence no concern for endometrial toxicity from tamoxifen.  Total time spent: 30 min   *Total Encounter Time as defined by the Centers for Medicare and Medicaid Services includes, in addition to the face-to-face time of a patient visit (documented in the note above) non-face-to-face time: obtaining and reviewing outside history, ordering and reviewing medications, tests or procedures, care coordination (communications with other health care professionals or caregivers) and documentation in the medical record.

## 2023-01-15 NOTE — Assessment & Plan Note (Signed)
This is a very pleasant 47 yr old premenopausal female patient with T1cN0M0 Grade 1-2 ER PR strongly positive Her 2 negative IDC as well as grade 2 DCIS referred to Breast [facility] for consideration of adjuvant therapy. Given small tumor and strongly ER PR positive and Her 2 neg, we discussed with upfront surgery followed by oncotype testing. She had right breast lumpectomy with axillary sentinel lymph node mapping.  Pathology from lumpectomy showed invasive ductal carcinoma, grade 1 measuring 2 cm, DCIS, intermediate grade, 4 out of 4 sentinel lymph nodes negative.  Prognostic showed ER 100% positive strong staining intensity, PR 100% positive strong staining intensity, HER2 negative and Ki-67 of 5%. Oncotype score of 17, no added benefit of chemotherapy. She completed adjuvant radiation and is now on tamoxifen. She is tolerating it well overall. ROS pertinent for mental fogginess and hot flashes. We will try some oxybutinin for hot flashes ( she is on lexapro and effexor has some interactions with this) She also takes ambien nightly hence didn't want to add gabapentin. She can take oxybutynin 2.5 mg daily. RTC in 6 months Mammogram Nov 2024.

## 2023-02-06 ENCOUNTER — Other Ambulatory Visit: Payer: Self-pay | Admitting: Hematology and Oncology

## 2023-03-15 ENCOUNTER — Ambulatory Visit: Payer: BC Managed Care – PPO | Attending: Surgery

## 2023-03-15 VITALS — Wt 167.4 lb

## 2023-03-15 DIAGNOSIS — Z483 Aftercare following surgery for neoplasm: Secondary | ICD-10-CM | POA: Insufficient documentation

## 2023-03-15 NOTE — Therapy (Signed)
OUTPATIENT PHYSICAL THERAPY SOZO SCREENING NOTE   Patient Name: Carly Miller MRN: [id number] DOB:11-Jul-1976, 47 y.o., female Today's Date: 03/15/2023  PCP: [name], MD REFERRING PROVIDER: Harriette Bouillon, MD   PT End of Session - 03/15/23 [phone]     Visit Number 1   # unchanged due to screen only   PT Start Time 0813    PT Stop Time 0816    PT Time Calculation (min) 3 min    Activity Tolerance Patient tolerated treatment well    Behavior During Therapy [facility] for tasks assessed/performed             Past Medical History:  Diagnosis Date   Breast cancer (HCC)    Endometriosis    Hematuria    History of radiation therapy    Right Breast- 12/29/21-02/12/22- Dr. Antony Blackbird   Hyperlipidemia    Mass of ovary    RIGHT   Pelvic pain in female    Personal history of radiation therapy    Urgency of urination    UTI (lower urinary tract infection)    Past Surgical History:  Procedure Laterality Date   ABDOMINAL HYSTERECTOMY N/A 08/31/2013   Procedure: HYSTERECTOMY ABDOMINAL, LYSIS OF ADHESIONS;  Surgeon: Turner Daniels, MD;  Location: [facility] [place];  Service: Gynecology;  Laterality: N/A;   ABSCESS DRAINAGE  09/22/1999   RECTAL   ANAL SPHINCTEROTOMY  09/21/1998   BREAST BIOPSY Right 10/27/2021   BREAST LUMPECTOMY WITH RADIOACTIVE SEED AND SENTINEL LYMPH NODE BIOPSY Right 11/25/2021   Procedure: RIGHT BREAST LUMPECTOMY WITH RADIOACTIVE SEED AND SENTINEL LYMPH NODE BIOPSY;  Surgeon: Harriette Bouillon, MD;  Location: [facility];  Service: General;  Laterality: Right;  90 MINUTES ROOM 8   DIAGNOSTIC LAPAROSCOPY LEFT OOPHORECTOMY  08/31/2007   ENDOMETRIOMA   LAPAROSCOPIC CHOLECYSTECTOMY  06/21/2012   LAPAROSCOPY N/A 08/31/2013   Procedure: LAPAROSCOPY DIAGNOSTIC;  Surgeon: Turner Daniels, MD;  Location: [facility];  Service: Gynecology;  Laterality: N/A;   Patient Active Problem List   Diagnosis Date Noted   Female pelvic inflammatory  disease 12/22/2021   Genetic testing 11/17/2021   Malignant neoplasm of upper-outer quadrant of right breast in female, estrogen receptor positive (HCC) 10/31/2021   S/P TAH (total abdominal hysterectomy) 08/31/2013   TOA (tubo-ovarian abscess) 08/31/2013   Endometrioma of ovary 08/31/2013   Pelvic adhesions 08/31/2013   S/P removal of right ovary 08/31/2013    REFERRING DIAG: right breast cancer at risk for lymphedema  THERAPY DIAG:  Aftercare following surgery for neoplasm  PERTINENT HISTORY: Patient was diagnosed on 10/10/2021 with 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 She underwent a right lumpectomy and sentinel node biopsy (4 negative nodes) on 11/25/2021. It is ER/PR positive and HER2 negative with a Ki67 of < 5%. She had breast implants placed in 2016.   PRECAUTIONS: right UE Lymphedema risk  SUBJECTIVE: Here for SOZO screen; doing fine  PAIN:  Are you having pain? No  SOZO SCREENING: Patient was assessed today using the SOZO machine to determine the lymphedema index score. This was compared to her baseline score. It was determined that she is within the recommended range when compared to her baseline and no further action is needed at this time. She will continue SOZO screenings. These are done every 3 months for 2 years post operatively followed by every 6 months for 2 years, and then annually.   L-DEX FLOWSHEETS - 03/15/23 0800  L-DEX LYMPHEDEMA SCREENING   Measurement Type Unilateral    L-DEX MEASUREMENT EXTREMITY Upper Extremity    POSITION  Standing    DOMINANT SIDE Right    At Risk Side Right    BASELINE SCORE (UNILATERAL) 1.7    L-DEX SCORE (UNILATERAL) -2    VALUE CHANGE (UNILAT) -3.7              Berna Spare, PTA 03/15/23 8:17 AM

## 2023-03-31 ENCOUNTER — Encounter: Payer: Self-pay | Admitting: Adult Health

## 2023-04-02 ENCOUNTER — Other Ambulatory Visit: Payer: Self-pay

## 2023-04-02 ENCOUNTER — Inpatient Hospital Stay: Payer: BC Managed Care – PPO | Attending: Hematology and Oncology | Admitting: Adult Health

## 2023-04-02 ENCOUNTER — Encounter: Payer: Self-pay | Admitting: Adult Health

## 2023-04-02 VITALS — BP 116/78 | HR 70 | Temp 97.7°F | Resp 17 | Ht 66.0 in | Wt 166.4 lb

## 2023-04-02 DIAGNOSIS — Z170 Estrogen receptor positive status [ER+]: Secondary | ICD-10-CM | POA: Diagnosis not present

## 2023-04-02 DIAGNOSIS — Z79810 Long term (current) use of selective estrogen receptor modulators (SERMs): Secondary | ICD-10-CM | POA: Insufficient documentation

## 2023-04-02 DIAGNOSIS — R21 Rash and other nonspecific skin eruption: Secondary | ICD-10-CM | POA: Diagnosis not present

## 2023-04-02 DIAGNOSIS — C50411 Malignant neoplasm of upper-outer quadrant of right female breast: Secondary | ICD-10-CM | POA: Diagnosis not present

## 2023-04-02 MED ORDER — TRIAMCINOLONE ACETONIDE 0.025 % EX OINT
1.0000 | TOPICAL_OINTMENT | Freq: Two times a day (BID) | CUTANEOUS | 0 refills | Status: DC
Start: 2023-04-02 — End: 2023-09-30

## 2023-04-02 NOTE — Assessment & Plan Note (Signed)
47 year old woman with history of stage IA right breast IDC ER/PR+ diagnosed in 10/2021 s/p lumpectomy, adjuvant radiation, and antiestrogen therapy with tamoxifen that she began in 01/2022.    Stage IA breast cancer: She continues on Tamoxifen with good tolerance.  Mammogram due in 07/2023. Skin rash: It is unclear what the etiology of the rash is.  I prescribed triamcinolone ointment for her to apply.  If it doesn't improve could consider seeing dermatology, or possibly her surgeon for biopsy due to long wait times to get into a dermatology clinic.   She will reach out to me in 1-2 weeks if her rash doesn't improve, or sooner if it begins to worsen.  She has breast cancer f/u with Dr. Al Pimple in 06/2023.

## 2023-04-02 NOTE — Progress Notes (Signed)
[facility] Cancer Follow up:    Carly Camp, MD [address] [place] [address]   DIAGNOSIS:  Cancer Staging  Malignant neoplasm of upper-outer quadrant of right breast in female, estrogen receptor positive (HCC) Staging form: Breast, AJCC 8th Edition - Clinical: Stage IA (cT1c, cN0, cM0, G2, ER+, PR+, HER2-) - Signed by Rachel Moulds, MD on 11/05/2021 Stage prefix: Initial diagnosis Histologic grading system: 3 grade system   SUMMARY OF ONCOLOGIC HISTORY: Oncology History  Malignant neoplasm of upper-outer quadrant of right breast in female, estrogen receptor positive (HCC)  10/10/2021 Mammogram   Indeterminate right breast mass at the 11 o'clock position corresponding to the patient, recommend ultrasound-guided biopsy of the, no suspicious right axillary lymphadenopathy.   Targeted ultrasound is performed, showing an irregular, hypoechoic mass with associated vascularity at the 11 o'clock position 2 cm from the nipple. It measures 1.9 x 1.6 x 1.0 cm. This corresponds with the patient's palpable lump. Evaluation of the right axilla demonstrates no suspicious lymphadenopathy.   10/31/2021 Initial Diagnosis   Malignant neoplasm of upper-outer quadrant of right breast in female, estrogen receptor positive (HCC)   11/05/2021 Cancer Staging   Staging form: Breast, AJCC 8th Edition - Clinical: Stage IA (cT1c, cN0, cM0, G2, ER+, PR+, HER2-) - Signed by Rachel Moulds, MD on 11/05/2021 Stage prefix: Initial diagnosis Histologic grading system: 3 grade system    Genetic Testing   [facility] CancerNext-Expanded Panel was Negative. Report date is 11/17/2021.  The CancerNext-Expanded gene panel offered by [facility] and includes sequencing, rearrangement, and RNA analysis for the following 77 genes: AIP, ALK, APC, ATM, AXIN2, BAP1, BARD1, BLM, BMPR1A, BRCA1, BRCA2, BRIP1, CDC73, CDH1, CDK4, CDKN1B, CDKN2A, CHEK2, CTNNA1, DICER1, FANCC, FH, FLCN, GALNT12,  KIF1B, LZTR1, MAX, MEN1, MET, MLH1, MSH2, MSH3, MSH6, MUTYH, NBN, NF1, NF2, NTHL1, PALB2, PHOX2B, PMS2, POT1, PRKAR1A, PTCH1, PTEN, RAD51C, RAD51D, RB1, RECQL, RET, SDHA, SDHAF2, SDHB, SDHC, SDHD, SMAD4, SMARCA4, SMARCB1, SMARCE1, STK11, SUFU, TMEM127, TP53, TSC1, TSC2, VHL and XRCC2 (sequencing and deletion/duplication); EGFR, EGLN1, HOXB13, KIT, MITF, PDGFRA, POLD1, and POLE (sequencing only); EPCAM and GREM1 (deletion/duplication only).     Pathology Results    Biopsy of the area showed IDC, grade 1-2, ER positive 100% moderate staining, PR positive 100% strong staining, Her 2 neg. Ki 5%.   11/25/2021 Surgery   Right breast lumpectomy showed invasive ductal carcinoma, 2 cm, grade 1, intermediate grade DCIS, 4 out of 4 sentinel lymph nodes without any micrometastasis, prior prognostics showed ER 100% positive strong staining, PR 100% positive strong staining, HER2 negative, Ki-67 less than 5%   11/25/2021 Oncotype testing   17/5%   12/29/2021 - 02/12/2022 Radiation Therapy   12/29/2021 through 02/12/2022 Site Technique Total Dose (Gy) Dose per Fx (Gy) Completed Fx Beam Energies  Breast, Right: Breast_R 3D 50.4/50.4 1.8 28/28 6X  Breast, Right: Breast_R_Bst specialPort 10/10 2 5/5 6E     01/2022 -  Anti-estrogen oral therapy   Tamoxifen daily     CURRENT THERAPY: Tamoxifen  INTERVAL HISTORY: Carly Miller 47 y.o. female returns for f/u of a right chest wall rash.  This has been going on for about a month, it has stayed on her right upper chest wall and itches.  It is not worsening or spreading.  She has tried diaper rash cream, her cream from radiation, and eucerin, all with no relief.  She is unsure what to do about the rash and what could be causing it.  Her most recent mammogram occurred on 07/29/2022 and demonstrated no mammographic evidence of malignancy and breast density category C.   Patient Active Problem List   Diagnosis Date Noted   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 12/22/2021    Genetic testing 11/17/2021   Malignant neoplasm of upper-outer quadrant of right breast in female, estrogen receptor positive (HCC) 10/31/2021   S/P TAH (total abdominal hysterectomy) 08/31/2013   TOA (tubo-ovarian abscess) 08/31/2013   Endometrioma of ovary 08/31/2013   Pelvic adhesions 08/31/2013   S/P removal of right ovary 08/31/2013    is allergic to naproxen sodium and naproxen.  MEDICAL HISTORY: Past Medical History:  Diagnosis Date   Breast cancer (HCC)    Endometriosis    Hematuria    History of radiation therapy    Right Breast- 12/29/21-02/12/22- Dr. Antony Blackbird   Hyperlipidemia    Mass of ovary    RIGHT   Pelvic pain in female    Personal history of radiation therapy    Urgency of urination    UTI (lower urinary tract infection)     SURGICAL HISTORY: Past Surgical History:  Procedure Laterality Date   ABDOMINAL HYSTERECTOMY N/A 08/31/2013   Procedure: HYSTERECTOMY ABDOMINAL, LYSIS OF ADHESIONS;  Surgeon: Turner Daniels, MD;  [facility];  Service: Gynecology;  Laterality: N/A;   ABSCESS DRAINAGE  09/22/1999   RECTAL   ANAL SPHINCTEROTOMY  09/21/1998   BREAST BIOPSY Right 10/27/2021   BREAST LUMPECTOMY WITH RADIOACTIVE SEED AND SENTINEL LYMPH NODE BIOPSY Right 11/25/2021   Procedure: RIGHT BREAST LUMPECTOMY WITH RADIOACTIVE SEED AND SENTINEL LYMPH NODE BIOPSY;  Surgeon: Harriette Bouillon, MD;  Location: [facility];  Service: General;  Laterality: Right;  90 MINUTES ROOM 8   DIAGNOSTIC LAPAROSCOPY LEFT OOPHORECTOMY  08/31/2007   ENDOMETRIOMA   LAPAROSCOPIC CHOLECYSTECTOMY  06/21/2012   LAPAROSCOPY N/A 08/31/2013   Procedure: LAPAROSCOPY DIAGNOSTIC;  Surgeon: Turner Daniels, MD;  Location: [facility];  Service: Gynecology;  Laterality: N/A;    SOCIAL HISTORY: Social History   Socioeconomic History   Marital status: Married    Spouse name: Not on file   Number of children: Not on file   Years of  education: Not on file   Highest education level: Not on file  Occupational History   Not on file  Tobacco Use   Smoking status: Never   Smokeless tobacco: Never  Vaping Use   Vaping status: Never Used  Substance and Sexual Activity   Alcohol use: No   Drug use: No   Sexual activity: Not on file  Other Topics Concern   Not on file  Social History Narrative   Not on file   Social Determinants of Health   Financial Resource Strain: Low Risk  (11/05/2021)   Overall Financial Resource Strain (CARDIA)    Difficulty of Paying Living Expenses: Not hard at all  Food Insecurity: No Food Insecurity (11/05/2021)   Hunger Vital Sign    Worried About Running Out of Food in the Last Year: Never true    Ran Out of Food in the Last Year: Never true  Transportation Needs: No Transportation Needs (11/05/2021)   PRAPARE - Administrator, Civil Service (Medical): No    Lack of Transportation (Non-Medical): No  Physical Activity: Not on file  Stress: Not on file  Social Connections: Unknown (01/19/2022)   [facility] from [facility]   Social Network    Social Network: Not on file  Intimate  Partner Violence: Unknown (12/24/2021)   Received from [facility]   HITS    Physically Hurt: Not on file    Insult or Talk Down To: Not on file    Threaten Physical Harm: Not on file    Scream or Curse: Not on file    FAMILY HISTORY: Family History  Problem Relation Age of Onset   Asthma Mother    Thyroid disease Mother    Asthma Father    Diabetes Father    Thyroid disease Father    Kidney disease Sister    Thyroid disease Maternal Aunt    Thyroid disease Maternal Uncle    Leukemia Maternal Grandmother        dx. 16s   Breast cancer Neg Hx    Colon cancer Neg Hx    Colon polyps Neg Hx    Esophageal cancer Neg Hx    Stomach cancer Neg Hx    Rectal cancer Neg Hx     Review of Systems  Constitutional:  Negative for appetite change, chills, fatigue, fever and unexpected weight  change.  HENT:   Negative for hearing loss, lump/mass and trouble swallowing.   Eyes:  Negative for eye problems and icterus.  Respiratory:  Negative for chest tightness, cough and shortness of breath.   Cardiovascular:  Negative for chest pain, leg swelling and palpitations.  Gastrointestinal:  Negative for abdominal distention, abdominal pain, constipation, diarrhea, nausea and vomiting.  Endocrine: Negative for hot flashes.  Genitourinary:  Negative for difficulty urinating.   Musculoskeletal:  Negative for arthralgias.  Skin:  Positive for rash. Negative for itching.  Neurological:  Negative for dizziness, extremity weakness, headaches and numbness.  Hematological:  Negative for adenopathy. Does not bruise/bleed easily.  Psychiatric/Behavioral:  Negative for depression. The patient is not nervous/anxious.      PHYSICAL EXAMINATION   Onc Performance Status - 04/02/23 1553       ECOG Perf Status   ECOG Perf Status Fully active, able to carry on all pre-disease performance without restriction      KPS SCALE   KPS % SCORE Normal, no compliants, no evidence of disease             Vitals:   04/02/23 1544  BP: 116/78  Pulse: 70  Resp: 17  Temp: 97.7 F (36.5 C)  SpO2: 100%    Physical Exam Constitutional:      General: She is not in acute distress.    Appearance: Normal appearance. She is not toxic-appearing.  HENT:     Head: Normocephalic and atraumatic.     Mouth/Throat:     Mouth: Mucous membranes are moist.     Pharynx: Oropharynx is clear. No oropharyngeal exudate or posterior oropharyngeal erythema.  Eyes:     General: No scleral icterus. Cardiovascular:     Rate and Rhythm: Normal rate and regular rhythm.     Pulses: Normal pulses.     Heart sounds: Normal heart sounds.  Pulmonary:     Effort: Pulmonary effort is normal.     Breath sounds: Normal breath sounds.  Abdominal:     General: Abdomen is flat. Bowel sounds are normal. There is no distension.      Palpations: Abdomen is soft.     Tenderness: There is no abdominal tenderness.  Musculoskeletal:        General: No swelling.     Cervical back: Neck supple.  Lymphadenopathy:     Cervical: No cervical adenopathy.  Skin:  General: Skin is warm and dry.     Findings: No rash.     Comments: 3 cm circular erythematous patch in right upper chest wall, non-warm  Neurological:     General: No focal deficit present.     Mental Status: She is alert.  Psychiatric:        Mood and Affect: Mood normal.        Behavior: Behavior normal.      ASSESSMENT and THERAPY PLAN:   Malignant neoplasm of upper-outer quadrant of right breast in female, estrogen receptor positive (HCC) 47 year old woman with history of stage IA right breast IDC ER/PR+ diagnosed in 10/2021 s/p lumpectomy, adjuvant radiation, and antiestrogen therapy with tamoxifen that she began in 01/2022.    Stage IA breast cancer: She continues on Tamoxifen with good tolerance.  Mammogram due in 07/2023. Skin rash: It is unclear what the etiology of the rash is.  I prescribed triamcinolone ointment for her to apply.  If it doesn't improve could consider seeing dermatology, or possibly her surgeon for biopsy due to long wait times to get into a dermatology clinic.   She will reach out to me in 1-2 weeks if her rash doesn't improve, or sooner if it begins to worsen.  She has breast cancer f/u with Dr. Al Pimple in 06/2023.   All questions were answered. The patient knows to call the clinic with any problems, questions or concerns. We can certainly see the patient much sooner if necessary.  Total encounter time:20 minutes*in face-to-face visit time, chart review, lab review, care coordination, order entry, and documentation of the encounter time.  Lillard Anes, NP 04/02/23 4:26 PM Medical Oncology and Hematology [facility] [address] [place], [place] [address] Tel. [phone]    Fax. [phone]  *Total  Encounter Time as defined by the Centers for Medicare and Medicaid Services includes, in addition to the face-to-face time of a patient visit (documented in the note above) non-face-to-face time: obtaining and reviewing outside history, ordering and reviewing medications, tests or procedures, care coordination (communications with other health care professionals or caregivers) and documentation in the medical record.

## 2023-06-14 ENCOUNTER — Ambulatory Visit: Payer: BC Managed Care – PPO | Attending: Surgery

## 2023-06-14 VITALS — Wt 171.1 lb

## 2023-06-14 DIAGNOSIS — Z483 Aftercare following surgery for neoplasm: Secondary | ICD-10-CM | POA: Insufficient documentation

## 2023-06-14 NOTE — Therapy (Signed)
OUTPATIENT PHYSICAL THERAPY SOZO SCREENING NOTE   Patient Name: Carly Miller MRN: [id number] DOB:01/21/1976, 47 y.o., female Today's Date: 06/14/2023  PCP: [name], MD REFERRING PROVIDER: Harriette Bouillon, MD   PT End of Session - 06/14/23 1633     Visit Number 1   # unchanged due to screen only   PT Start Time 1631    PT Stop Time 1635    PT Time Calculation (min) 4 min    Activity Tolerance Patient tolerated treatment well    Behavior During Therapy [facility] for tasks assessed/performed             Past Medical History:  Diagnosis Date   Breast cancer (HCC)    Endometriosis    Hematuria    History of radiation therapy    Right Breast- 12/29/21-02/12/22- Dr. Antony Blackbird   Hyperlipidemia    Mass of ovary    RIGHT   Pelvic pain in female    Personal history of radiation therapy    Urgency of urination    UTI (lower urinary tract infection)    Past Surgical History:  Procedure Laterality Date   ABDOMINAL HYSTERECTOMY N/A 08/31/2013   Procedure: HYSTERECTOMY ABDOMINAL, LYSIS OF ADHESIONS;  Surgeon: Turner Daniels, MD;  Location: [facility] ;  Service: Gynecology;  Laterality: N/A;   ABSCESS DRAINAGE  09/22/1999   RECTAL   ANAL SPHINCTEROTOMY  09/21/1998   BREAST BIOPSY Right 10/27/2021   BREAST LUMPECTOMY WITH RADIOACTIVE SEED AND SENTINEL LYMPH NODE BIOPSY Right 11/25/2021   Procedure: RIGHT BREAST LUMPECTOMY WITH RADIOACTIVE SEED AND SENTINEL LYMPH NODE BIOPSY;  Surgeon: Harriette Bouillon, MD;  Location: [facility];  Service: General;  Laterality: Right;  90 MINUTES ROOM 8   DIAGNOSTIC LAPAROSCOPY LEFT OOPHORECTOMY  08/31/2007   ENDOMETRIOMA   LAPAROSCOPIC CHOLECYSTECTOMY  06/21/2012   LAPAROSCOPY N/A 08/31/2013   Procedure: LAPAROSCOPY DIAGNOSTIC;  Surgeon: Turner Daniels, MD;  Location: [facility];  Service: Gynecology;  Laterality: N/A;   Patient Active Problem List   Diagnosis Date Noted   Female pelvic inflammatory  disease 12/22/2021   Genetic testing 11/17/2021   Malignant neoplasm of upper-outer quadrant of right breast in female, estrogen receptor positive (HCC) 10/31/2021   S/P TAH (total abdominal hysterectomy) 08/31/2013   TOA (tubo-ovarian abscess) 08/31/2013   Endometrioma of ovary 08/31/2013   Pelvic adhesions 08/31/2013   S/P removal of right ovary 08/31/2013    REFERRING DIAG: right breast cancer at risk for lymphedema  THERAPY DIAG:  Aftercare following surgery for neoplasm  PERTINENT HISTORY: Patient was diagnosed on 10/10/2021 with 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 She underwent a right lumpectomy and sentinel node biopsy (4 negative nodes) on 11/25/2021. It is ER/PR positive and HER2 negative with a Ki67 of < 5%. She had breast implants placed in 2016.   PRECAUTIONS: right UE Lymphedema risk  SUBJECTIVE: Pt returns for her 3 month L-Dex screen.   PAIN:  Are you having pain? No  SOZO SCREENING: Patient was assessed today using the SOZO machine to determine the lymphedema index score. This was compared to her baseline score. It was determined that she is within the recommended range when compared to her baseline and no further action is needed at this time. She will continue SOZO screenings. These are done every 3 months for 2 years post operatively followed by every 6 months for 2 years, and then annually.   L-DEX FLOWSHEETS - 06/14/23 1600  L-DEX LYMPHEDEMA SCREENING   Measurement Type Unilateral    L-DEX MEASUREMENT EXTREMITY Upper Extremity    POSITION  Standing    DOMINANT SIDE Right    At Risk Side Right    BASELINE SCORE (UNILATERAL) 1.7    L-DEX SCORE (UNILATERAL) -1.2    VALUE CHANGE (UNILAT) -2.9              Berna Spare, PTA 06/14/23 4:34 PM

## 2023-07-16 ENCOUNTER — Inpatient Hospital Stay: Payer: BC Managed Care – PPO | Admitting: Hematology and Oncology

## 2023-07-26 ENCOUNTER — Telehealth: Payer: Self-pay

## 2023-07-26 ENCOUNTER — Inpatient Hospital Stay: Payer: BC Managed Care – PPO | Attending: Hematology and Oncology | Admitting: Hematology and Oncology

## 2023-07-26 VITALS — BP 111/73 | HR 91 | Temp 97.9°F | Resp 16 | Wt 174.9 lb

## 2023-07-26 DIAGNOSIS — Z79810 Long term (current) use of selective estrogen receptor modulators (SERMs): Secondary | ICD-10-CM | POA: Diagnosis not present

## 2023-07-26 DIAGNOSIS — C50411 Malignant neoplasm of upper-outer quadrant of right female breast: Secondary | ICD-10-CM | POA: Insufficient documentation

## 2023-07-26 DIAGNOSIS — Z170 Estrogen receptor positive status [ER+]: Secondary | ICD-10-CM | POA: Insufficient documentation

## 2023-07-26 MED ORDER — TAMOXIFEN CITRATE 20 MG PO TABS: 20.0000 mg | ORAL_TABLET | Freq: Every day | ORAL | 3 refills | Status: DC

## 2023-07-26 NOTE — Assessment & Plan Note (Addendum)
47 year old woman with history of stage IA right breast IDC ER/PR+ diagnosed in 10/2021 s/p lumpectomy, adjuvant radiation, and antiestrogen therapy with tamoxifen that she began in 01/2022.    Breast Cancer On Tamoxifen with reported cognitive side effects. Mammogram scheduled for 08/02/2023. -No concerns on PE.  Bilateral breast inspected and palpated.  No masses or regional adenopathy, implants appear to be intact -Continue Tamoxifen. -Consider participation in cognitive study run by [facility]. -Refill Tamoxifen prescription. -Follow-up in 6 months  Rash Resolved with topical cream. Unknown etiology, unlikely related to tamoxifen -No further intervention needed at this time.

## 2023-07-26 NOTE — Progress Notes (Signed)
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   Targeted ultrasound is performed, showing an irregular, hypoechoic mass with associated vascularity at the 11 o'clock position 2 cm from the nipple. It measures 1.9 x 1.6 x 1.0 cm. This corresponds with the patient's palpable lump. Evaluation of the right axilla demonstrates no suspicious lymphadenopathy.   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 Report date is 11/17/2021.  The CancerNext-Expanded gene panel offered by [facility] and includes sequencing, rearrangement, and RNA analysis for the following 77 genes: AIP, ALK, APC, ATM, AXIN2, BAP1, BARD1, BLM, BMPR1A, BRCA1, BRCA2, BRIP1, CDC73, CDH1, CDK4, CDKN1B, CDKN2A, CHEK2, CTNNA1, DICER1, FANCC, FH, FLCN, GALNT12,  KIF1B, LZTR1, MAX, MEN1, MET, MLH1, MSH2, MSH3, MSH6, MUTYH, NBN, NF1, NF2, NTHL1, PALB2, PHOX2B, PMS2, POT1, PRKAR1A, PTCH1, PTEN, RAD51C, RAD51D, RB1, RECQL, RET, SDHA, SDHAF2, SDHB, SDHC, SDHD, SMAD4, SMARCA4, SMARCB1, SMARCE1, STK11, SUFU, TMEM127, TP53, TSC1, TSC2, VHL and XRCC2 (sequencing and deletion/duplication); EGFR, EGLN1, HOXB13, KIT, MITF, PDGFRA, POLD1, and POLE (sequencing only); EPCAM and GREM1 (deletion/duplication only).     Pathology Results    Biopsy of the area showed IDC, grade 1-2, ER positive 100% moderate staining, PR positive 100% strong staining, Her 2 neg. Ki 5%.   11/25/2021 Surgery   Right breast lumpectomy showed invasive ductal carcinoma, 2 cm, grade 1, intermediate grade DCIS, 4 out of 4 sentinel lymph nodes without any micrometastasis, prior prognostics showed ER 100% positive strong staining, PR 100% positive strong staining, HER2 negative, Ki-67 less than 5%   11/25/2021 Oncotype testing   17/5%   12/29/2021 - 02/12/2022 Radiation Therapy   12/29/2021 through 02/12/2022 Site Technique Total Dose (Gy) Dose per Fx (Gy) Completed Fx Beam Energies  Breast, Right: Breast_R 3D 50.4/50.4 1.8 28/28 6X  Breast, Right: Breast_R_Bst specialPort 10/10 2 5/5 6E     01/2022 -  Anti-estrogen oral therapy   Tamoxifen daily     CURRENT THERAPY: Tamoxifen  INTERVAL HISTORY:  Carly Miller 47 y.o. Miller returns for f/u  Her most recent mammogram occurred on 07/29/2022 and demonstrated no mammographic evidence of malignancy and breast density category C.  Discussed the use of AI scribe software for clinical note transcription with the patient, who gave verbal consent to proceed.  History of Present Illness    The patient, with a history of breast cancer, presents for follow-up. She reports a recent rash that has since resolved  with a topical cream. The rash's etiology remains unknown, but it is not believed to be related to her oral contraceptive use.  The patient  continues to take tamoxifen for her breast cancer and reports cognitive side effects, including memory fogginess. This has impacted her work, necessitating her to take work home. She has been offered participation in a cognitive study run by [facility], which she has agreed to.  The patient also mentions an upcoming mammogram scheduled for November 11th. She has not noticed any changes in her breasts or any new symptoms.  Patient Active Problem List   Diagnosis Date Noted  . Miller pelvic inflammatory disease 12/22/2021  . Genetic testing 11/17/2021  . 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 S/P TAH (total abdominal hysterectomy) 08/31/2013  . TOA (tubo-ovarian abscess) 08/31/2013  . Endometrioma of ovary 08/31/2013  . Pelvic adhesions 08/31/2013  . S/P removal of right ovary 08/31/2013    is allergic to naproxen sodium and naproxen.  MEDICAL HISTORY: Past Medical History:  Diagnosis Date  . Breast cancer (HCC)   . Endometriosis   . Hematuria   . History of radiation therapy    Right Breast- 12/29/21-02/12/22- Dr. Antony Blackbird  . Hyperlipidemia   . Mass of ovary    RIGHT  . Pelvic pain in Miller   . Personal history of radiation therapy   . Urgency of urination   . UTI (lower urinary tract infection)     SURGICAL HISTORY: Past Surgical History:  Procedure Laterality Date  . ABDOMINAL HYSTERECTOMY N/A 08/31/2013   Procedure: HYSTERECTOMY ABDOMINAL, LYSIS OF ADHESIONS;  Surgeon: Turner Daniels, MD;  Location[facility];  Service: Gynecology;  Laterality: N/A;  . ABSCESS DRAINAGE  09/22/1999   RECTAL  . ANAL SPHINCTEROTOMY  09/21/1998  . BREAST BIOPSY Right 10/27/2021  . BREAST LUMPECTOMY WITH RADIOACTIVE SEED AND SENTINEL LYMPH NODE BIOPSY Right 11/25/2021   Procedure: RIGHT BREAST LUMPECTOMY WITH RADIOACTIVE SEED AND SENTINEL LYMPH NODE BIOPSY;  Surgeon: Harriette Bouillon,  MD;  Location: [facility];  Service: General;  Laterality: Right;  90 MINUTES ROOM 8  . DIAGNOSTIC LAPAROSCOPY LEFT OOPHORECTOMY  08/31/2007   ENDOMETRIOMA  . LAPAROSCOPIC CHOLECYSTECTOMY  06/21/2012  . LAPAROSCOPY N/A 08/31/2013   Procedure: LAPAROSCOPY DIAGNOSTIC;  Surgeon: Turner Daniels, MD;  Location: [facility];  Service: Gynecology;  Laterality: N/A;    SOCIAL HISTORY: Social History   Socioeconomic History  . Marital status: Married    Spouse name: Not on file  . Number of children: Not on file  . Years of education: Not on file  . Highest education level: Not on file  Occupational History  . Not on file  Tobacco Use  . Smoking status: Never  . Smokeless tobacco: Never  Vaping Use  . Vaping status: Never Used  Substance and Sexual Activity  . Alcohol use: No  . Drug use: No  . Sexual activity: Not on file  Other Topics Concern  . Not on file  Social History Narrative  . Not on file   Social Determinants of Health   Financial Resource Strain: Low Risk  (11/05/2021)   Overall Financial Resource Strain (CARDIA)   . Difficulty of Paying Living Expenses: Not hard at all  Food Insecurity: No Food Insecurity (11/05/2021)   Hunger Vital Sign   . Worried About Programme researcher, broadcasting/film/video in the Last Year: Never true   .  Ran Out of Food in the Last Year: Never true  Transportation Needs: No Transportation Needs (11/05/2021)   PRAPARE - Transportation   . Lack of Transportation (Medical): No   . Lack of Transportation (Non-Medical): No  Physical Activity: Not on file  Stress: Not on file  Social Connections: Unknown (01/19/2022)   [facility]   Social Network   . Social Network: Not on file  Intimate Partner Violence: Unknown (12/24/2021)   Received from [facility], [facility]   HITS   . Physically Hurt: Not on file   . Insult or Talk Down To: Not on file   . Threaten Physical Harm: Not on file   . Scream or  Curse: Not on file    FAMILY HISTORY: Family History  Problem Relation Age of Onset  . Asthma Mother   . Thyroid disease Mother   . Asthma Father   . Diabetes Father   . Thyroid disease Father   . Kidney disease Sister   . Thyroid disease Maternal Aunt   . Thyroid disease Maternal Uncle   . Leukemia Maternal Grandmother        dx. 28s  . Breast cancer Neg Hx   . Colon cancer Neg Hx   . Colon polyps Neg Hx   . Esophageal cancer Neg Hx   . Stomach cancer Neg Hx   . Rectal cancer Neg Hx     Review of Systems  Constitutional:  Negative for appetite change, chills, fatigue, fever and unexpected weight change.  HENT:   Negative for hearing loss, lump/mass and trouble swallowing.   Eyes:  Negative for eye problems and icterus.  Respiratory:  Negative for chest tightness, cough and shortness of breath.   Cardiovascular:  Negative for chest pain, leg swelling and palpitations.  Gastrointestinal:  Negative for abdominal distention, abdominal pain, constipation, diarrhea, nausea and vomiting.  Endocrine: Negative for hot flashes.  Genitourinary:  Negative for difficulty urinating.   Musculoskeletal:  Negative for arthralgias.  Skin:  Positive for rash. Negative for itching.  Neurological:  Negative for dizziness, extremity weakness, headaches and numbness.  Hematological:  Negative for adenopathy. Does not bruise/bleed easily.  Psychiatric/Behavioral:  Negative for depression. The patient is not nervous/anxious.      PHYSICAL EXAMINATION     Vitals:   07/26/23 1103  BP: 111/73  Pulse: 91  Resp: 16  Temp: 97.9 F (36.6 C)  SpO2: 100%    Physical Exam Constitutional:      General: She is not in acute distress.    Appearance: Normal appearance. She is not toxic-appearing.  HENT:     Head: Normocephalic and atraumatic.     Mouth/Throat:     Mouth: Mucous membranes are moist.     Pharynx: Oropharynx is clear. No oropharyngeal exudate or posterior oropharyngeal erythema.   Eyes:     General: No scleral icterus. Chest:     Comments: Bilateral breasts inspected and palpated.  Hyperpigmentation near the right areola appears stable.  No palpable masses.  Implants appear intact.  No regional adenopathy. Abdominal:     General: Abdomen is flat. Bowel sounds are normal. There is no distension.     Palpations: Abdomen is soft.     Tenderness: There is no abdominal tenderness.  Musculoskeletal:     Cervical back: Neck supple.  Skin:    General: Skin is warm and dry.  Neurological:     General: No focal deficit present.  Mental Status: She is alert.  Psychiatric:        Mood and Affect: Mood normal.        Behavior: Behavior normal.     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    Rash Resolved with topical cream. Unknown etiology, unlikely related to oral contraceptive use. -No further intervention needed at this time.  Breast Cancer On Tamoxifen with reported cognitive side effects. Mammogram scheduled for 08/02/2023. -No concerns on PE.  Bilateral breast inspected and palpated.  No masses or regional adenopathy, implants appear to be intact -Continue Tamoxifen. -Consider participation in cognitive study run by [facility]. -Refill Tamoxifen prescription. -Follow-up in 6 months or after mammogram results, whichever comes first.     All questions were answered. The patient knows to call the clinic with any problems, questions or concerns. We can certainly see the patient much sooner if necessary.  Total encounter time:20 minutes*in face-to-face visit time, chart review, lab review, care coordination, order entry, and documentation of the encounter  time.  Lillard Anes, NP 07/26/23 11:39 AM Medical Oncology and Hematology [facility] [address] [place], [place] [address] Tel. [phone]    Fax. [phone]  *Total Encounter Time as defined by the Centers for Medicare and Medicaid Services includes, in addition to the face-to-face time of a patient visit (documented in the note above) non-face-to-face time: obtaining and reviewing outside history, ordering and reviewing medications, tests or procedures, care coordination (communications with other health care professionals or caregivers) and documentation in the medical record.

## 2023-07-26 NOTE — Telephone Encounter (Signed)
NRG-CC011: COGNITIVE TRAINING FOR CANCER RELATED COGNITIVE IMPAIRMENT IN BREAST CANCER SURVIVORS: A MULTI-CENTER RANDOMIZED DOUBLE- BLINDED CONTROLLED TRIAL   Rec'd referral from Dr Al Pimple for NRG CC011.   Called patient to discuss study. She is interested in screening. Confirmed the following: She is able to read, speak, and understand [place].  She has no history of any cancers other than the breast cancer she was treated for last year. She has never had exposure to chemotherapeutics for reasons other than cancer. She has never had CNS radiation, intrathecal therapy, or CNS-involved surgery. She has no history of CVA, TBI, brain surgery, Alzheimer's dementia, or other dementia. She has no active substance abuse, no history of substance abuse, no treatment for substance abuse.  She has no history of bipolar disorder, psychosis, schizophrenia, ADHD, or learning disability. She is not enrolled in any other research trials.   Screening assessment was conducted over the phone using the NRG-CC011 screening script. She scored 5 on Assessment #1 and 5 on Assessment #2. She scored 1 on Assessment #3. Based on these assessments, she is eligible to proceed with potential enrollment. I confirmed her demographic/contact information and emailed her consents. She requests a follow up call late tomorrow afternoon.  Carly Chance Scherrie Seneca, RN, BSN, [facility] She  Her  Hers Clinical Research Nurse [facility] Direct Dial [phone]  Pager [phone] 07/26/2023 3:31 PM

## 2023-07-29 DIAGNOSIS — C50411 Malignant neoplasm of upper-outer quadrant of right female breast: Secondary | ICD-10-CM

## 2023-07-29 NOTE — Research (Signed)
Trial Name:  NRG-CC011: COGNITIVE TRAINING FOR CANCER RELATED COGNITIVE IMPAIRMENT IN BREAST CANCER SURVIVORS: A MULTI-CENTER RANDOMIZED DOUBLE- BLINDED CONTROLLED TRIAL    Patient Carly Miller was identified by Dr Al Pimple as a potential candidate for the above listed study.  This Clinical Research Nurse met with Carly Miller, Carly Miller on 07/29/23 in a manner and location that ensures patient privacy to discuss participation in the above listed research study.  Patient is Unaccompanied.  Patient was previously provided with informed consent documents.  Patient confirmed they have read the informed consent documents.  As outlined in the informed consent form, this Nurse and [name] discussed the purpose of the research study, the investigational nature of the study, study procedures and requirements for study participation, potential risks and benefits of study participation, as well as alternatives to participation.  This study is not blinded or double-blinded. The patient understands participation is voluntary and they may withdraw from study participation at any time.  Each study arm was reviewed, and randomization discussed.  Potential side effects were reviewed with patient as outlined in the consent form, and patient made aware there may be side effects not yet known. This study does not involve a placebo. Patient understands enrollment is pending full eligibility review.   Confidentiality and how the patient's information will be used as part of study participation were discussed.  Patient was informed there is not reimbursement provided for their time and effort spent on trial participation.  The patient is encouraged to discuss research study participation with their insurance provider to determine what costs they may incur as part of study participation, including research related injury.    All questions were answered to patient's satisfaction.  The informed consent and separate HIPAA  Authorization was reviewed page by page.  The patient's mental and emotional status is appropriate to provide informed consent, and the patient verbalizes an understanding of study participation.  Patient has agreed to participate in the above listed research study and has voluntarily signed the informed consent version 03/05/23 and separate HIPAA Authorization, version date 06/21/23  on 07/29/23 at 1207PM.  The patient was provided with a copy of the signed informed consent form and separate HIPAA Authorization for their reference.  No study specific procedures were obtained prior to the signing of the informed consent document.  Approximately 15 minutes were spent with the patient reviewing the informed consent documents.  Patient was not requested to complete a Release of Information form.  [name], RN, BSN, [facility] She  Her  Hers Clinical Research Nurse [facility] Direct Dial [phone]  Pager [phone] 07/29/2023 12:40 PM

## 2023-08-02 ENCOUNTER — Telehealth: Payer: Self-pay

## 2023-08-02 ENCOUNTER — Ambulatory Visit
Admission: RE | Admit: 2023-08-02 | Discharge: 2023-08-02 | Disposition: A | Discharge status: Home / Self Care | Payer: BC Managed Care – PPO | Source: Ambulatory Visit | Attending: Hematology and Oncology | Admitting: Hematology and Oncology

## 2023-08-02 ENCOUNTER — Inpatient Hospital Stay: Payer: BC Managed Care – PPO

## 2023-08-02 DIAGNOSIS — C50411 Malignant neoplasm of upper-outer quadrant of right female breast: Secondary | ICD-10-CM

## 2023-08-02 NOTE — Telephone Encounter (Signed)
Returned her call. She is getting notifications from mychart that she needs Hep C screening. Per Lillard Anes, DNP she should reach out to PCP to see if a screening is needed. The office can update her record if the MD is outside of [facility]. She verbalized understanding and will ask as next PCP appt.

## 2023-09-02 DIAGNOSIS — C50411 Malignant neoplasm of upper-outer quadrant of right female breast: Secondary | ICD-10-CM

## 2023-09-02 NOTE — Research (Signed)
NRG-CC011: COGNITIVE TRAINING FOR CANCER RELATED COGNITIVE IMPAIRMENT IN BREAST CANCER SURVIVORS: A MULTI-CENTER RANDOMIZED DOUBLE- BLINDED CONTROLLED TRIAL   Rec'd notification that patient can be registered to Step 1; this was done in OPEN.  Margret Chance Amaliya [place], RN, BSN, [facility] She  Her  Hers Clinical Research Nurse [facility] Direct Dial [phone] 09/02/2023 3:08 PM

## 2023-09-03 ENCOUNTER — Other Ambulatory Visit: Payer: Self-pay | Admitting: *Deleted

## 2023-09-03 ENCOUNTER — Encounter: Payer: Self-pay | Admitting: Adult Health

## 2023-09-13 ENCOUNTER — Ambulatory Visit: Payer: BC Managed Care – PPO | Attending: Surgery

## 2023-09-13 VITALS — Wt 177.2 lb

## 2023-09-13 DIAGNOSIS — Z483 Aftercare following surgery for neoplasm: Secondary | ICD-10-CM | POA: Insufficient documentation

## 2023-09-13 NOTE — Therapy (Signed)
OUTPATIENT PHYSICAL THERAPY SOZO SCREENING NOTE   Patient Name: Carly Miller MRN: [id number] DOB:04-03-1976, 47 y.o., female Today's Date: 09/13/2023  PCP: [name], MD REFERRING PROVIDER: Harriette Bouillon, MD   PT End of Session - 09/13/23 [phone]     Visit Number 1   # unchanged due to screen only   PT Start Time 0945    PT Stop Time 0949    PT Time Calculation (min) 4 min    Activity Tolerance Patient tolerated treatment well    Behavior During Therapy [facility] for tasks assessed/performed             Past Medical History:  Diagnosis Date   Breast cancer (HCC)    Endometriosis    Hematuria    History of radiation therapy    Right Breast- 12/29/21-02/12/22- Dr. Antony Blackbird   Hyperlipidemia    Mass of ovary    RIGHT   Pelvic pain in female    Personal history of radiation therapy    Urgency of urination    UTI (lower urinary tract infection)    Past Surgical History:  Procedure Laterality Date   ABDOMINAL HYSTERECTOMY N/A 08/31/2013   Procedure: HYSTERECTOMY ABDOMINAL, LYSIS OF ADHESIONS;  Surgeon: Turner Daniels, MD;  Location: [facility];  Service: Gynecology;  Laterality: N/A;   ABSCESS DRAINAGE  09/22/1999   RECTAL   ANAL SPHINCTEROTOMY  09/21/1998   BREAST BIOPSY Right 10/27/2021   BREAST LUMPECTOMY WITH RADIOACTIVE SEED AND SENTINEL LYMPH NODE BIOPSY Right 11/25/2021   Procedure: RIGHT BREAST LUMPECTOMY WITH RADIOACTIVE SEED AND SENTINEL LYMPH NODE BIOPSY;  Surgeon: Harriette Bouillon, MD;  Location: [facility];  Service: General;  Laterality: Right;  90 MINUTES ROOM 8   DIAGNOSTIC LAPAROSCOPY LEFT OOPHORECTOMY  08/31/2007   ENDOMETRIOMA   LAPAROSCOPIC CHOLECYSTECTOMY  06/21/2012   LAPAROSCOPY N/A 08/31/2013   Procedure: LAPAROSCOPY DIAGNOSTIC;  Surgeon: Turner Daniels, MD;  Location[facility];  Service: Gynecology;  Laterality: N/A;   Patient Active Problem List   Diagnosis Date Noted   Female pelvic inflammatory  disease 12/22/2021   Genetic testing 11/17/2021   Malignant neoplasm of upper-outer quadrant of right breast in female, estrogen receptor positive (HCC) 10/31/2021   S/P TAH (total abdominal hysterectomy) 08/31/2013   TOA (tubo-ovarian abscess) 08/31/2013   Endometrioma of ovary 08/31/2013   Pelvic adhesions 08/31/2013   S/P removal of right ovary 08/31/2013    REFERRING DIAG: right breast cancer at risk for lymphedema  THERAPY DIAG:  Aftercare following surgery for neoplasm  PERTINENT HISTORY: Patient was diagnosed on 10/10/2021 with 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 She underwent a right lumpectomy and sentinel node biopsy (4 negative nodes) on 11/25/2021. It is ER/PR positive and HER2 negative with a Ki67 of < 5%. She had breast implants placed in 2016.   PRECAUTIONS: right UE Lymphedema risk  SUBJECTIVE: Pt returns for her 3 month L-Dex screen.   PAIN:  Are you having pain? No  SOZO SCREENING: Patient was assessed today using the SOZO machine to determine the lymphedema index score. This was compared to her baseline score. It was determined that she is within the recommended range when compared to her baseline and no further action is needed at this time. She will continue SOZO screenings. These are done every 3 months for 2 years post operatively followed by every 6 months for 2 years, and then annually.   L-DEX FLOWSHEETS - 09/13/23 0900  L-DEX LYMPHEDEMA SCREENING   Measurement Type Unilateral    L-DEX MEASUREMENT EXTREMITY Upper Extremity    POSITION  Standing    DOMINANT SIDE Right    At Risk Side Right    BASELINE SCORE (UNILATERAL) 1.7    L-DEX SCORE (UNILATERAL) -2.2    VALUE CHANGE (UNILAT) -3.9             P: Begin 6 month screens after next.    Berna Spare, PTA 09/13/23 9:49 AM

## 2023-09-29 NOTE — Progress Notes (Signed)
 I, Carly Miller, CMA acting as a neurosurgeon for Carly Lloyd, MD.  Carly Miller is a 48 y.o. female who presents to [facility] at [facility] today for re-occurring R thigh pain. Pt was previously seen by Dr. Lloyd on 05/23/23 and pain had dramatically improved after PT and HEP.  Today, pt c/o shooting pains at lateral thigh over the past few weeks. Exercises from PT are exacerbating sx. Notes constant n/t in the thigh. Shooting pains come and go and vary in intensity. Some low back pain. Denies weakness, favoring left leg. Has tried using standing desk while working, this also exacerbated sx. Has been unable to exercise d/t sx. Has tried heat, ice, and stretching with no relief. Some TTP. Gets some relief when applying pressing on the gluteal region.   Dx imaging: 04/07/22 L-spine & R hip XR   Pertinent review of systems: No fevers or chills  Relevant historical information: History of breast cancer   Exam:  BP 110/78   Pulse 87   Ht 5' 6 (1.676 m)   Wt 181 lb (82.1 kg)   LMP 08/10/2013   SpO2 98%   BMI 29.21 kg/m  General: Well Developed, well nourished, and in no acute distress.   MSK: L-spine: Normal appearing Nontender palpation spinal midline normal lumbar motion. Mildly positive straight leg raise test. Reflexes and strength are intact.  Right hip normal-appearing Mildly tender palpation greater trochanter.   Not tender at piriformis area. Normal hip motion. Hip abduction and external rotation strength are mildly diminished.   Lab and Radiology Results No results found for this or any previous visit (from the past 72 hours). DG Lumbar Spine 2-3 Views Result Date: 09/30/2023 CLINICAL DATA:  Right side pain intermittent for several weeks. EXAM: LUMBAR SPINE - 2-3 VIEW COMPARISON:  April 07, 2022 FINDINGS: There is no evidence of lumbar spine fracture. Mild curvature of spine. Minimal anterior spurring noted in the upper and mid lumbar spine. Intervertebral  disc spaces are maintained. IMPRESSION: Minimal degenerative joint changes of lumbar spine. Electronically Signed   By: Craig Farr M.D.   On: 09/30/2023 08:17   I, Carly Miller, personally (independently) visualized and performed the interpretation of the images attached in this note.     Assessment and Plan: 48 y.o. female with chronic low back pain radiating down the right leg.  This occurs in the setting of prior trochanteric bursitis.  I think that is also occurring but the primary issue today is lumbar radiculopathy likely right L5. Plan for course of prednisone  and gabapentin .  Plan to refer to physical therapy as well.  Updated lumbar spine x-ray.  Recheck in 2 months.  If not improving or if worsening consider MRI lumbar spine.  Also we could consider a greater trochanter bursa injection as well.   PDMP not reviewed this encounter. Orders Placed This Encounter  Procedures   DG Lumbar Spine 2-3 Views    Standing Status:   Future    Number of Occurrences:   1    Expiration Date:   10/31/2023    Reason for Exam (SYMPTOM  OR DIAGNOSIS REQUIRED):   lumbar radiculopathy    Preferred imaging location?:   [place]    Is patient pregnant?:   No   Ambulatory referral to Physical Therapy    Referral Priority:   Routine    Referral Type:   Physical Medicine    Referral Reason:   Specialty Services Required  Requested Specialty:   Physical Therapy    Number of Visits Requested:   1   Meds ordered this encounter  Medications   predniSONE  (DELTASONE ) 50 MG tablet    Sig: Take 1 tablet (50 mg total) by mouth daily with breakfast for 5 days.    Dispense:  5 tablet    Refill:  0   gabapentin  (NEURONTIN ) 300 MG capsule    Sig: Take 1 capsule (300 mg total) by mouth 3 (three) times daily as needed.    Dispense:  90 capsule    Refill:  2     Discussed warning signs or symptoms. Please see discharge instructions. Patient expresses understanding.   The above documentation  has been reviewed and is accurate and complete Carly Miller, M.D.

## 2023-09-30 ENCOUNTER — Ambulatory Visit: Payer: BC Managed Care – PPO | Admitting: Family Medicine

## 2023-09-30 ENCOUNTER — Encounter: Payer: Self-pay | Admitting: Family Medicine

## 2023-09-30 ENCOUNTER — Ambulatory Visit (INDEPENDENT_AMBULATORY_CARE_PROVIDER_SITE_OTHER): Payer: BC Managed Care – PPO

## 2023-09-30 VITALS — BP 110/78 | HR 87 | Ht 66.0 in | Wt 181.0 lb

## 2023-09-30 DIAGNOSIS — G8929 Other chronic pain: Secondary | ICD-10-CM

## 2023-09-30 DIAGNOSIS — M5441 Lumbago with sciatica, right side: Secondary | ICD-10-CM

## 2023-09-30 DIAGNOSIS — M25551 Pain in right hip: Secondary | ICD-10-CM

## 2023-09-30 DIAGNOSIS — Z170 Estrogen receptor positive status [ER+]: Secondary | ICD-10-CM

## 2023-09-30 MED ORDER — GABAPENTIN 300 MG PO CAPS: 300.0000 mg | ORAL_CAPSULE | Freq: Three times a day (TID) | ORAL | 2 refills | Status: DC | PRN

## 2023-09-30 MED ORDER — PREDNISONE 50 MG PO TABS: 50.0000 mg | ORAL_TABLET | Freq: Every day | ORAL | 0 refills | Status: AC

## 2023-09-30 NOTE — Patient Instructions (Signed)
 Thank you for coming in today.  Please get an Xray today before you leave  A referral has been placed for Physical Therapy with [facility].  Start Prednisone burst and Gabapentin  See you back as needed

## 2023-09-30 NOTE — Progress Notes (Signed)
 Low back x-ray shows mild arthritis

## 2023-09-30 NOTE — Research (Signed)
 Trial: DCP-001: Use of a Clinical Trial Screening Tool to Address Cancer Health Disparities in the [employer] Research Program [facility])  Patient Carly Miller was identified by this RN as a potential candidate for the above listed study.  This Clinical Research Nurse spoke with Carly Miller, [id number], on 09/30/23 via phone to discuss participation in the above listed research study.  Patient is Unaccompanied.  A copy of the informed consent document and separate HIPAA Authorization were mailed to the patient.  Patient reads, speaks, and understands English.    Patient already has the business card of this Nurse, which was also mailed with her consent copies, and encouraged to contact the research team with any questions.  Patient was provided the option of taking informed consent documents home to review and was encouraged to review at their convenience with their support network, including other care providers. Patient is comfortable with making a decision regarding study participation today.  As outlined in the informed consent form, this Nurse and Carly Miller discussed the purpose of the research study, the investigational nature of the study, study procedures and requirements for study participation, potential risks and benefits of study participation, as well as alternatives to participation. This study is not blinded. The patient understands participation is voluntary and they may withdraw from study participation at any time.  This study does not involve randomization.  This study does not involve an investigational drug or device. This study does not involve a placebo. Patient understands enrollment is pending full eligibility review.   Confidentiality and how the patient's information will be used as part of study participation were discussed.  Patient was informed there is not reimbursement provided for their time and effort spent on trial participation.  The patient is encouraged to  discuss research study participation with their insurance provider to determine what costs they may incur as part of study participation, including research related injury.    All questions were answered to patient's satisfaction.  The informed consent and separate HIPAA Authorization was reviewed page by page.  The patient's mental and emotional status is appropriate to provide informed consent, and the patient verbalizes an understanding of study participation.  Patient has agreed to participate in the above listed research study and has completed the phone consent by reading the phone consent script and receiving verbal agreement to participate.  The patient was provided with a copy of the consent and HIPAA forms for their reference.  No study specific procedures were obtained prior to the signing of the informed consent document.  Approximately 10 minutes were spent with the patient reviewing the informed consent documents.  Patient was not requested to complete a Release of Information form.  After obtaining verbal consent, patient answered the study-specific questions. She was thanked for her time and reminded to call me with any questions or concerns.  Carly Miller Griselle Rufer, RN, BSN, [facility] She  Her  Hers Clinical Research Nurse [facility] Direct Dial [phone] 09/30/2023 10:56 AM

## 2023-10-07 ENCOUNTER — Other Ambulatory Visit: Payer: Self-pay

## 2023-10-07 ENCOUNTER — Encounter: Payer: Self-pay | Admitting: Physical Therapy

## 2023-10-07 ENCOUNTER — Ambulatory Visit: Payer: BC Managed Care – PPO | Admitting: Physical Therapy

## 2023-10-07 DIAGNOSIS — M79604 Pain in right leg: Secondary | ICD-10-CM | POA: Diagnosis not present

## 2023-10-07 DIAGNOSIS — R208 Other disturbances of skin sensation: Secondary | ICD-10-CM

## 2023-10-07 DIAGNOSIS — M6281 Muscle weakness (generalized): Secondary | ICD-10-CM | POA: Diagnosis not present

## 2023-10-07 DIAGNOSIS — R29898 Other symptoms and signs involving the musculoskeletal system: Secondary | ICD-10-CM

## 2023-10-07 DIAGNOSIS — R252 Cramp and spasm: Secondary | ICD-10-CM

## 2023-10-07 NOTE — Therapy (Signed)
OUTPATIENT PHYSICAL THERAPY THORACOLUMBAR EVALUATION   Patient Name: Carly Miller MRN: [id number] DOB:1976/02/12, 48 y.o., female Today's Date: 10/07/2023  END OF SESSION:  PT End of Session - 10/07/23 1349     Visit Number 1    Number of Visits 9    Date for PT Re-Evaluation 12/02/23    Authorization Type BCBS    Authorization Time Period 10/07/23 to 12/02/23    PT Start Time 1302    PT Stop Time 1344    PT Time Calculation (min) 42 min    Activity Tolerance Patient tolerated treatment well    Behavior During Therapy [facility] for tasks assessed/performed             Past Medical History:  Diagnosis Date   Breast cancer (HCC)    Endometriosis    Hematuria    History of radiation therapy    Right Breast- 12/29/21-02/12/22- Dr. Antony Blackbird   Hyperlipidemia    Mass of ovary    RIGHT   Pelvic pain in female    Personal history of radiation therapy    Urgency of urination    UTI (lower urinary tract infection)    Past Surgical History:  Procedure Laterality Date   ABDOMINAL HYSTERECTOMY N/A 08/31/2013   Procedure: HYSTERECTOMY ABDOMINAL, LYSIS OF ADHESIONS;  Surgeon: Turner Daniels, MD;  Location: [facility];  Service: Gynecology;  Laterality: N/A;   ABSCESS DRAINAGE  09/22/1999   RECTAL   ANAL SPHINCTEROTOMY  09/21/1998   BREAST BIOPSY Right 10/27/2021   BREAST LUMPECTOMY WITH RADIOACTIVE SEED AND SENTINEL LYMPH NODE BIOPSY Right 11/25/2021   Procedure: RIGHT BREAST LUMPECTOMY WITH RADIOACTIVE SEED AND SENTINEL LYMPH NODE BIOPSY;  Surgeon: Harriette Bouillon, MD;  Location: [facility];  Service: General;  Laterality: Right;  90 MINUTES ROOM 8   DIAGNOSTIC LAPAROSCOPY LEFT OOPHORECTOMY  08/31/2007   ENDOMETRIOMA   LAPAROSCOPIC CHOLECYSTECTOMY  06/21/2012   LAPAROSCOPY N/A 08/31/2013   Procedure: LAPAROSCOPY DIAGNOSTIC;  Surgeon: Turner Daniels, MD;  Location: [facility];  Service: Gynecology;  Laterality: N/A;   Patient Active  Problem List   Diagnosis Date Noted   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 12/22/2021   Genetic testing 11/17/2021   Malignant neoplasm of upper-outer quadrant of right breast in female, estrogen receptor positive (HCC) 10/31/2021   S/P TAH (total abdominal hysterectomy) 08/31/2013   TOA (tubo-ovarian abscess) 08/31/2013   Endometrioma of ovary 08/31/2013   Pelvic adhesions 08/31/2013   S/P removal of right ovary 08/31/2013    PCP: [name] MD   REFERRING PROVIDER: Rodolph Bong, MD  REFERRING DIAG: [phone] (ICD-10-CM) - Chronic right hip pain M54.41,G89.29 (ICD-10-CM) - Chronic right-sided low back pain with right-sided sciatica   Rationale for Evaluation and Treatment: Rehabilitation  THERAPY DIAG:  Pain in right leg  Muscle weakness (generalized)  Cramp and spasm  Other symptoms and signs involving the musculoskeletal system  Other disturbances of skin sensation  ONSET DATE: re-flared again a couple of months ago  SUBJECTIVE:  SUBJECTIVE STATEMENT:  I went back to my old exercises when the problem first started coming back. The old exercises really helped a lot at first, then the pain started creeping down my leg over the past couple months but about 2 weeks ago it was really severe, shooting/sharp/severe and waking me up at night. Dr. Denyse Amass thought it was my back, put me on a nerve medicine (gabpentin per chart) which helped tremendously. Did xray but no MRI yet. PT worked very well last time so wanted to try it again.  For a couple of months if I walked or ran the pain got worse, so haven't been doing really anything recently due to that. Hard to say if there is really a pattern to the pain at this point, if I do anything for too long it flares up. No known MOI or unusual motions  around th e time this flared up.   PERTINENT HISTORY:  See above   PAIN:  Are you having pain? Yes: NPRS scale: 2-3/10, >10/10 at worst  Pain location: side of R thigh  Pain description: not intense now, discomfort  Aggravating factors: any activities, overdoing  Relieving factors: gabapentin   PRECAUTIONS: None  RED FLAGS: None   WEIGHT BEARING RESTRICTIONS: No  FALLS:  Has patient fallen in last 6 months? 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  DIAGNOSTIC FINDINGS:    IMPRESSION: Minimal degenerative joint changes of lumbar spine.  FINDINGS: There is no evidence of hip fracture or dislocation. There is no evidence of arthropathy or other focal bone abnormality.  FINDINGS: There is no evidence of lumbar spine fracture. Alignment is normal. Intervertebral disc spaces are maintained.    PATIENT SURVEYS:  FOTO 49, predicted 62 in 11 visits   COGNITION: Overall cognitive status: Within functional limits for tasks assessed        POSTURE: rounded shoulders, forward head, decreased lumbar lordosis, and flexed trunk   PALPATION: R lumbar paraspinals tight and sore, deep trigger point noted R piriformis, R HS and upper calf tight and tender    FLEXIBILITY  Quad moderate limitation B HS severe limitation R, moderate limitation L Piriformis WNL R/mild limitation L   LUMBAR ROM:   AROM eval  Flexion Moderate limitation; RFIS no change   Extension Mild limitation; REIS increased pain   Right lateral flexion Mild  limitation   Left lateral flexion Mild limitation   Right rotation   Left rotation    (Blank rows = not tested)    LOWER EXTREMITY MMT:    MMT Right eval Left eval  Hip flexion 4+ 4+  Hip extension 3- 3-  Hip abduction 4 4  Hip adduction    Hip internal rotation    Hip external rotation    Knee flexion 4+ 4+  Knee extension 5 5  Ankle dorsiflexion 5 5  Ankle plantarflexion    Ankle inversion    Ankle eversion     (Blank rows = not tested)  LUMBAR SPECIAL TESTS:  Straight leg raise test: Negative and R LE about 1/3 to 1/4 cm  longer than the L   Slump test (-) R LE     TREATMENT DATE:   10/07/23  Eval, care planning, HEP  Nustep L4 x6 minutes BLEs only  HS stretch RLE 1x30 seconds Bridges x10 SKTC 5x5 seconds R LE Supine piriformis stretch LE straight 1x5 seconds Lumbar rotation stretch 1x5 seconds                                                                                                                                   PATIENT EDUCATION:  Education details: exam findings, POC, HEP  Person educated: Patient Education method: Explanation, Demonstration, and Handouts Education comprehension: verbalized understanding, returned demonstration, and needs further education  HOME EXERCISE PROGRAM: Access Code: [email or web address] URL: [email or web address] Date: 10/07/2023 Prepared by: Nedra Hai  Exercises - Seated Hamstring Stretch  - 2 x daily - 7 x weekly - 1 sets - 2 reps - 30 seconds  hold - Supine Bridge  - 2 x daily - 7 x weekly - 1 sets - 10 reps - 2-3 seconds  hold - Supine Single Knee to Chest Stretch  - 2 x daily - 7 x weekly - 1 sets - 5-10 reps - 5 seconds  hold - Supine Piriformis Stretch with Leg Straight  - 2 x daily - 7 x weekly - 1 sets - 5-10 reps - 5 seconds  hold - Supine Lumbar Rotation Stretch  - 2 x daily - 7 x weekly - 1 sets - 5-10 reps  ASSESSMENT:  CLINICAL IMPRESSION: Patient is a 48 y.o. F who was seen today for  physical therapy evaluation and treatment for Diagnosis M25.551,G89.29 (ICD-10-CM) - Chronic right hip pain M54.41,G89.29 (ICD-10-CM) - Chronic right-sided low back pain with right-sided sciatica. She had been doing really well in general with existing HEP, however pain returned a couple of months ago and was quite severe until she was prescribed gabapentin from referring MD. Exam with objective findings as above. Likely has a significant trigger point in deep hip musculature that may be pressing on sciatic nerve and contributing to pain- deep pressure relieves her pain until it is removed from the area, anticipate that she may do really well with DN to help address this. Will make every reasonable effort to reduce pain and assist in return to optimal level of function with ultimate goal of return to exercise routine pain free.   OBJECTIVE IMPAIRMENTS: decreased ROM, decreased strength, increased fascial restrictions, increased muscle spasms, impaired flexibility, impaired sensation, postural dysfunction, and pain.   ACTIVITY LIMITATIONS: carrying, lifting, bending, sitting, standing, squatting, sleeping, stairs, and locomotion level  PARTICIPATION LIMITATIONS: driving, shopping, community activity, occupation, and church  PERSONAL FACTORS: Age, Fitness, Past/current experiences, and Time since onset of injury/illness/exacerbation are also affecting patient's functional outcome.   REHAB POTENTIAL: Good  CLINICAL DECISION MAKING: Stable/uncomplicated  EVALUATION COMPLEXITY: Low   GOALS: Goals reviewed with patient? No  SHORT TERM GOALS: Target date:  12/02/2023    Will be compliant with appropriate progressive HEP  Baseline: Goal status: INITIAL  2.  Lumbar ROM limitations found at eval to have normalized Baseline:  Goal status: INITIAL  3.  All mm flexibility impairments in LEs to have normalized  Baseline:  Goal status: INITIAL    LONG TERM GOALS: Target date: 12/02/2023    MMT  to have improved by at least grade in all weak groups  Baseline:  Goal status: INITIAL  2.  Will be able to perform all desired exercise programming without increase in pain  Baseline:  Goal status: INITIAL  3.  Radicular symptoms to have resolved  Baseline:  Goal status: INITIAL  4.  Pain to be no more than 2/10 at worst with all functional and gym based activities  Baseline:  Goal status: INITIAL  5.  FOTO score to be within 5 points of predicted by time of DC  Baseline:  Goal status: INITIAL    PLAN:  PT FREQUENCY: 1x/week  PT DURATION: 8 weeks  PLANNED INTERVENTIONS: 97164- PT Re-evaluation, 97110-Therapeutic exercises, 97530- Therapeutic activity, 97535- Self Care, 16109- Manual therapy, 97760- Orthotic Fit/training, [id number]- Aquatic Therapy, 97014- Electrical stimulation (unattended), [id number]- Traction (mechanical), Dry Needling, Cryotherapy, and Moist heat.  PLAN FOR NEXT SESSION: proximal and core strength, dry needling and deep manual techniques to hip musculature, consider placing heel lift   Nedra Hai, PT, DPT 10/07/23 2:04 PM

## 2023-10-12 ENCOUNTER — Encounter: Payer: Self-pay | Admitting: Rehabilitative and Restorative Service Providers"

## 2023-10-12 ENCOUNTER — Ambulatory Visit: Payer: BC Managed Care – PPO | Admitting: Rehabilitative and Restorative Service Providers"

## 2023-10-12 DIAGNOSIS — R208 Other disturbances of skin sensation: Secondary | ICD-10-CM

## 2023-10-12 DIAGNOSIS — R29898 Other symptoms and signs involving the musculoskeletal system: Secondary | ICD-10-CM

## 2023-10-12 DIAGNOSIS — M6281 Muscle weakness (generalized): Secondary | ICD-10-CM

## 2023-10-12 DIAGNOSIS — M79604 Pain in right leg: Secondary | ICD-10-CM

## 2023-10-12 DIAGNOSIS — R252 Cramp and spasm: Secondary | ICD-10-CM

## 2023-10-12 NOTE — Therapy (Signed)
OUTPATIENT PHYSICAL THERAPY TREATMENT   Patient Name: Carly Miller MRN: [id number] DOB:05-May-1976, 48 y.o., female Today's Date: 10/12/2023  END OF SESSION:  PT End of Session - 10/12/23 1626     Visit Number 2    Number of Visits 9    Date for PT Re-Evaluation 12/02/23    Authorization Type BCBS    Authorization Time Period 10/07/23 to 12/02/23    PT Start Time 1554    PT Stop Time 1634    PT Time Calculation (min) 40 min    Activity Tolerance Patient tolerated treatment well    Behavior During Therapy [facility] for tasks assessed/performed              Past Medical History:  Diagnosis Date   Breast cancer (HCC)    Endometriosis    Hematuria    History of radiation therapy    Right Breast- 12/29/21-02/12/22- Dr. Antony Blackbird   Hyperlipidemia    Mass of ovary    RIGHT   Pelvic pain in female    Personal history of radiation therapy    Urgency of urination    UTI (lower urinary tract infection)    Past Surgical History:  Procedure Laterality Date   ABDOMINAL HYSTERECTOMY N/A 08/31/2013   Procedure: HYSTERECTOMY ABDOMINAL, LYSIS OF ADHESIONS;  Surgeon: Turner Daniels, MD;  Location: [facility] ;  Service: Gynecology;  Laterality: N/A;   ABSCESS DRAINAGE  09/22/1999   RECTAL   ANAL SPHINCTEROTOMY  09/21/1998   BREAST BIOPSY Right 10/27/2021   BREAST LUMPECTOMY WITH RADIOACTIVE SEED AND SENTINEL LYMPH NODE BIOPSY Right 11/25/2021   Procedure: RIGHT BREAST LUMPECTOMY WITH RADIOACTIVE SEED AND SENTINEL LYMPH NODE BIOPSY;  Surgeon: Harriette Bouillon, MD;  Location: [facility];  Service: General;  Laterality: Right;  90 MINUTES ROOM 8   DIAGNOSTIC LAPAROSCOPY LEFT OOPHORECTOMY  08/31/2007   ENDOMETRIOMA   LAPAROSCOPIC CHOLECYSTECTOMY  06/21/2012   LAPAROSCOPY N/A 08/31/2013   Procedure: LAPAROSCOPY DIAGNOSTIC;  Surgeon: Turner Daniels, MD;  Location: [facility];  Service: Gynecology;  Laterality: N/A;   Patient Active Problem List    Diagnosis Date Noted   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 12/22/2021   Genetic testing 11/17/2021   Malignant neoplasm of upper-outer quadrant of right breast in female, estrogen receptor positive (HCC) 10/31/2021   S/P TAH (total abdominal hysterectomy) 08/31/2013   TOA (tubo-ovarian abscess) 08/31/2013   Endometrioma of ovary 08/31/2013   Pelvic adhesions 08/31/2013   S/P removal of right ovary 08/31/2013    PCP: [name] MD   REFERRING PROVIDER: Rodolph Bong, MD  REFERRING DIAG: [phone] (ICD-10-CM) - Chronic right hip pain M54.41,G89.29 (ICD-10-CM) - Chronic right-sided low back pain with right-sided sciatica   Rationale for Evaluation and Treatment: Rehabilitation  THERAPY DIAG:  Pain in right leg  Muscle weakness (generalized)  Cramp and spasm  Other symptoms and signs involving the musculoskeletal system  Other disturbances of skin sensation  ONSET DATE: re-flared again a couple of months ago  SUBJECTIVE:  SUBJECTIVE STATEMENT: Pt indicated feeling about the same overall.  Pt indicated she was ready to trial needling.   PERTINENT HISTORY:  See above   PAIN:  NPRS scale: earlier today 5/10 Pain location: side of Rt thigh to foot Pain description: not intense now, discomfort  Aggravating factors: any activities, overdoing  Relieving factors: gabapentin   PRECAUTIONS: None  RED FLAGS: None   WEIGHT BEARING RESTRICTIONS: No  FALLS:  Has patient fallen in last 6 months? 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  DIAGNOSTIC FINDINGS:  10/07/2023 review:  IMPRESSION: Minimal degenerative joint changes of lumbar spine.  FINDINGS: There is no evidence of hip fracture or dislocation. There is no evidence of arthropathy or other focal bone abnormality.  FINDINGS: There is no evidence of lumbar spine fracture. Alignment is normal. Intervertebral disc spaces are maintained.    PATIENT SURVEYS:  10/07/2023 FOTO 49, predicted 62 in 11 visits   COGNITION: 10/07/2023 Overall cognitive status: Within functional limits for tasks assessed     POSTURE: 10/07/2023  rounded shoulders, forward head, decreased lumbar lordosis, and flexed trunk   PALPATION: 10/07/2023 Rt lumbar paraspinals tight and sore, deep trigger point noted R piriformis, R HS and upper calf tight and tender    FLEXIBILITY 10/07/2023 Quad moderate limitation B HS severe limitation R, moderate limitation L Piriformis WNL R/mild limitation L   LUMBAR ROM:   AROM 10/07/2023 10/12/2023  Flexion Moderate limitation; RFIS no change    Extension Mild limitation; REIS increased pain  50 % WFL with tightness Rt hamstring noted  Right lateral flexion Mild limitation    Left lateral flexion Mild limitation    Right rotation    Left rotation     (Blank rows = not tested)    LOWER EXTREMITY MMT:    MMT Right 10/07/2023 Left 10/07/2023  Hip flexion 4+ 4+  Hip extension 3- 3-  Hip abduction 4 4  Hip adduction    Hip internal rotation    Hip external rotation    Knee flexion 4+ 4+  Knee extension 5 5  Ankle dorsiflexion 5 5  Ankle plantarflexion    Ankle inversion    Ankle eversion     (Blank rows = not tested)  LUMBAR SPECIAL TESTS:  10/07/2023 Straight leg raise test: Negative and R LE about 1/3 to 1/4 cm longer than the L   Slump  test (-) R LE                      TREATMENT DATE:          DATE: 10/12/2023 Manual Compression with Rt glute med/min with skilled palpation with dry needling.  Percussive device to same.   Trigger Point Dry Needling Initial Treatment: Pt instructed on Dry Needling rational, procedures, and possible side effects. Pt instructed to expect mild to moderate muscle soreness later in the day and/or into the next day.  Pt instructed in methods to reduce muscle soreness. Pt instructed to continue prescribed HEP. Patient verbalized understanding of these instructions and education.  Patient Verbal  Consent Given: Yes Education Handout Provided: Yes Muscles treated: Rt glute med/min Treatment response/outcome: local twitch response  Therex: Nustep Lvl 5 8 mins UE/LE Hamstring stretch with foot elevated on step in standing  Standing hip abduction 2 x 10 bilateral  Supine bridge 2x10 SKC 5 sec hold x 5 on Rt Leg Piriformis figure 4 pull towards x 5    TREATMENT DATE:          DATE: 10/07/2023  Eval, care planning, HEP  Nustep L4 x6 minutes BLEs only  HS stretch RLE 1x30 seconds Bridges x10 SKTC 5x5 seconds R LE Supine piriformis stretch LE straight 1x5 seconds Lumbar rotation stretch 1x5 seconds                                                                                                                                   PATIENT EDUCATION:  Education details: exam findings, POC, HEP  Person educated: Patient Education method: Explanation, Demonstration, and Handouts Education comprehension: verbalized understanding, returned demonstration, and needs further education  HOME EXERCISE PROGRAM: Access Code: [email or web address] URL: [email or web address] Date: 10/07/2023 Prepared by: Nedra Hai  Exercises - Seated Hamstring Stretch  - 2 x daily - 7 x weekly - 1 sets - 2 reps - 30 seconds  hold - Supine Bridge  - 2 x daily - 7 x weekly - 1 sets - 10 reps - 2-3 seconds   hold - Supine Single Knee to Chest Stretch  - 2 x daily - 7 x weekly - 1 sets - 5-10 reps - 5 seconds  hold - Supine Piriformis Stretch with Leg Straight  - 2 x daily - 7 x weekly - 1 sets - 5-10 reps - 5 seconds  hold - Supine Lumbar Rotation Stretch  - 2 x daily - 7 x weekly - 1 sets - 5-10 reps  ASSESSMENT:  CLINICAL IMPRESSION: Indications of possible concordant symptom involvement from Rt glute med/min trigger points.  Performance of soft tissue myofascial release as well as needling to address.  Good tolerance overall.  Pt may beneift from progressive hip mobility/strengthening to gain towards full return to PLOF s symptoms.    OBJECTIVE IMPAIRMENTS: decreased ROM, decreased strength, increased fascial restrictions, increased muscle spasms, impaired flexibility, impaired sensation, postural dysfunction, and pain.   ACTIVITY LIMITATIONS: carrying, lifting, bending, sitting, standing, squatting, sleeping, stairs, and locomotion level  PARTICIPATION LIMITATIONS: driving, shopping, community activity, occupation, and church  PERSONAL FACTORS: Age, Fitness, Past/current experiences, and Time since onset of injury/illness/exacerbation are also affecting patient's functional outcome.   REHAB POTENTIAL: Good  CLINICAL DECISION MAKING: Stable/uncomplicated  EVALUATION COMPLEXITY: Low   GOALS: Goals reviewed with patient? No  SHORT TERM GOALS: Target date: 12/02/2023    Will be compliant with appropriate progressive HEP  Baseline: Goal status: INITIAL  2.  Lumbar ROM limitations found at eval to have normalized Baseline:  Goal status: INITIAL  3.  All mm flexibility impairments in  LEs to have normalized  Baseline:  Goal status: INITIAL    LONG TERM GOALS: Target date: 12/02/2023    MMT to have improved by at least grade in all weak groups  Baseline:  Goal status: INITIAL  2.  Will be able to perform all desired exercise programming without increase in pain  Baseline:   Goal status: INITIAL  3.  Radicular symptoms to have resolved  Baseline:  Goal status: INITIAL  4.  Pain to be no more than 2/10 at worst with all functional and gym based activities  Baseline:  Goal status: INITIAL  5.  FOTO score to be within 5 points of predicted by time of DC  Baseline:  Goal status: INITIAL    PLAN:  PT FREQUENCY: 1x/week  PT DURATION: 8 weeks  PLANNED INTERVENTIONS: 97164- PT Re-evaluation, 97110-Therapeutic exercises, 97530- Therapeutic activity, 97535- Self Care, 16109- Manual therapy, 97760- Orthotic Fit/training, [id number]- Aquatic Therapy, 97014- Electrical stimulation (unattended), [id number]- Traction (mechanical), Dry Needling, Cryotherapy, and Moist heat.  PLAN FOR NEXT SESSION: Needling as desired by patient for symptoms.     Chyrel Masson, PT, DPT, OCS, ATC 10/12/23  4:38 PM

## 2023-10-12 NOTE — Patient Instructions (Signed)

## 2023-10-17 ENCOUNTER — Other Ambulatory Visit: Payer: Self-pay | Admitting: Hematology and Oncology

## 2023-10-21 ENCOUNTER — Encounter: Payer: Self-pay | Admitting: Rehabilitative and Restorative Service Providers"

## 2023-10-21 ENCOUNTER — Ambulatory Visit: Payer: BC Managed Care – PPO | Admitting: Rehabilitative and Restorative Service Providers"

## 2023-10-21 DIAGNOSIS — R29898 Other symptoms and signs involving the musculoskeletal system: Secondary | ICD-10-CM

## 2023-10-21 DIAGNOSIS — M79604 Pain in right leg: Secondary | ICD-10-CM | POA: Diagnosis not present

## 2023-10-21 DIAGNOSIS — R208 Other disturbances of skin sensation: Secondary | ICD-10-CM | POA: Diagnosis not present

## 2023-10-21 DIAGNOSIS — R252 Cramp and spasm: Secondary | ICD-10-CM

## 2023-10-21 DIAGNOSIS — M6281 Muscle weakness (generalized): Secondary | ICD-10-CM

## 2023-10-21 NOTE — Therapy (Signed)
OUTPATIENT PHYSICAL THERAPY TREATMENT   Patient Name: Carly Miller MRN: [id number] DOB:1976-06-14, 48 y.o., female Today's Date: 10/21/2023  END OF SESSION:  PT End of Session - 10/21/23 1305     Visit Number 3    Number of Visits 9    Date for PT Re-Evaluation 12/02/23    Authorization Type BCBS    Authorization Time Period 10/07/23 to 12/02/23    PT Start Time 1303    PT Stop Time 1349    PT Time Calculation (min) 46 min    Activity Tolerance Patient tolerated treatment well;No increased pain    Behavior During Therapy [facility] for tasks assessed/performed             Past Medical History:  Diagnosis Date   Breast cancer (HCC)    Endometriosis    Hematuria    History of radiation therapy    Right Breast- 12/29/21-02/12/22- Dr. Antony Blackbird   Hyperlipidemia    Mass of ovary    RIGHT   Pelvic pain in female    Personal history of radiation therapy    Urgency of urination    UTI (lower urinary tract infection)    Past Surgical History:  Procedure Laterality Date   ABDOMINAL HYSTERECTOMY N/A 08/31/2013   Procedure: HYSTERECTOMY ABDOMINAL, LYSIS OF ADHESIONS;  Surgeon: Turner Daniels, MD;  Location: [facility];  Service: Gynecology;  Laterality: N/A;   ABSCESS DRAINAGE  09/22/1999   RECTAL   ANAL SPHINCTEROTOMY  09/21/1998   BREAST BIOPSY Right 10/27/2021   BREAST LUMPECTOMY WITH RADIOACTIVE SEED AND SENTINEL LYMPH NODE BIOPSY Right 11/25/2021   Procedure: RIGHT BREAST LUMPECTOMY WITH RADIOACTIVE SEED AND SENTINEL LYMPH NODE BIOPSY;  Surgeon: Harriette Bouillon, MD;  Location: [facility];  Service: General;  Laterality: Right;  90 MINUTES ROOM 8   DIAGNOSTIC LAPAROSCOPY LEFT OOPHORECTOMY  08/31/2007   ENDOMETRIOMA   LAPAROSCOPIC CHOLECYSTECTOMY  06/21/2012   LAPAROSCOPY N/A 08/31/2013   Procedure: LAPAROSCOPY DIAGNOSTIC;  Surgeon: Turner Daniels, MD;  Location: [facility];  Service: Gynecology;  Laterality: N/A;   Patient Active  Problem List   Diagnosis Date Noted   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 12/22/2021   Genetic testing 11/17/2021   Malignant neoplasm of upper-outer quadrant of right breast in female, estrogen receptor positive (HCC) 10/31/2021   S/P TAH (total abdominal hysterectomy) 08/31/2013   TOA (tubo-ovarian abscess) 08/31/2013   Endometrioma of ovary 08/31/2013   Pelvic adhesions 08/31/2013   S/P removal of right ovary 08/31/2013    PCP: [name] MD   REFERRING PROVIDER: Rodolph Bong, MD  REFERRING DIAG: [phone] (ICD-10-CM) - Chronic right hip pain M54.41,G89.29 (ICD-10-CM) - Chronic right-sided low back pain with right-sided sciatica   Rationale for Evaluation and Treatment: Rehabilitation  THERAPY DIAG:  Pain in right leg  Muscle weakness (generalized)  Cramp and spasm  Other symptoms and signs involving the musculoskeletal system  Other disturbances of skin sensation  ONSET DATE: re-flared again a couple of months ago  SUBJECTIVE:  SUBJECTIVE STATEMENT:  Carly Miller has a history of right LE radicular pain to the lat eral thigh.  Symptoms slowly stared to move towards the foot.  Once symptoms started to wake her at night, she saw Dr. Denyse Amass.  Carly Miller notes no peripheral symptoms since needling.    PERTINENT HISTORY:  See above   PAIN:  NPRS scale: 0-5/10 this week Pain location: Mostly Rt lateral thigh to the foot Pain description: not intense now, discomfort  Aggravating factors: any activities, overdoing  Relieving factors: gabapentin   PRECAUTIONS: None  RED FLAGS: None   WEIGHT BEARING RESTRICTIONS: No  FALLS:  Has patient fallen in last 6 months? 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  DIAGNOSTIC FINDINGS:  10/07/2023 review:  IMPRESSION: Minimal degenerative joint changes of lumbar spine.  FINDINGS: There is no evidence of hip fracture or dislocation. There is no evidence of arthropathy or other focal bone abnormality.  FINDINGS: There is no evidence of lumbar spine fracture. Alignment is normal. Intervertebral disc spaces are maintained.    PATIENT SURVEYS:  10/07/2023 FOTO 49, predicted 62 in 11 visits   COGNITION: 10/07/2023 Overall cognitive status: Within functional limits for tasks assessed     POSTURE: 10/07/2023  rounded shoulders, forward head, decreased lumbar lordosis, and flexed trunk   PALPATION: 10/07/2023 Rt lumbar paraspinals tight and sore, deep trigger point noted R piriformis, R HS and upper calf tight and tender    FLEXIBILITY 10/07/2023 Quad moderate limitation B HS severe limitation R, moderate limitation L Piriformis WNL R/mild limitation L   10/21/2023 Hip flexion: 100/100 Hamstrings: 35/35 Hip IR: 20/20 Hip ER: 17/18   LUMBAR ROM:   AROM 10/07/2023 10/12/2023 10/21/2023  Flexion Moderate limitation; RFIS no change     Extension Mild limitation; REIS increased pain  50 % WFL with tightness Rt hamstring noted 0  Right lateral flexion Mild limitation     Left lateral flexion Mild limitation     Right rotation     Left rotation      (Blank rows = not tested)    LOWER EXTREMITY MMT:    MMT Right 10/07/2023 Left 10/07/2023  Hip flexion 4+ 4+  Hip extension 3- 3-  Hip abduction 4 4  Hip adduction    Hip internal rotation    Hip external rotation    Knee flexion  4+ 4+  Knee extension 5 5  Ankle dorsiflexion 5 5  Ankle plantarflexion    Ankle inversion    Ankle eversion     (Blank rows = not tested)  LUMBAR SPECIAL TESTS:  10/07/2023 Straight leg raise test: Negative and R LE about 1/3 to 1/4 cm longer than the L   Slump test (-) R LE                      TREATMENT DATE:        10/21/2023 Lumbar extension AROM 3 sets of 5 for 3 seconds Supine hamstrings stretch 5 x 20 seconds Figure 4 (push) stretch 5 x 20 seconds Yoga Bridge 10 x 5 seconds  Verbally reviewed other HEP exercises and discussed the importance of maintaining a normal lumbar curve with seated activities  Functional Activities: Bed mobility (log roll), practical golfer's and diagonal squat lift techniques, reviewed spine anatomy, disc pressures in various positions, discussed walking 20+ minutes 3 days a week and reviewed basic spine education regarding avoiding flexion and rotation   DATE: 10/12/2023 Manual Compression with Rt glute med/min with skilled palpation with dry needling.  Percussive device to same.   Trigger Point Dry Needling Initial Treatment: Pt instructed on Dry Needling rational, procedures, and possible side effects. Pt instructed to expect mild to moderate muscle soreness later in the day and/or into the next day.  Pt instructed in methods to reduce muscle soreness. Pt instructed to continue prescribed HEP. Patient verbalized understanding of these instructions and education.  Patient Verbal Consent Given: Yes Education Handout Provided: Yes Muscles treated: Rt glute med/min Treatment response/outcome: local twitch response  Therex: Nustep Lvl 5 8 mins UE/LE Hamstring stretch with foot elevated on step in standing  Standing hip abduction 2 x 10 bilateral  Supine bridge 2x10 SKC 5 sec hold x 5 on Rt Leg Piriformis figure 4 pull towards x 5    TREATMENT DATE:          DATE: 10/07/2023  Eval, care planning, HEP  Nustep L4 x6 minutes BLEs only  HS  stretch RLE 1x30 seconds Bridges x10 SKTC 5x5 seconds R LE Supine piriformis stretch LE straight 1x5 seconds Lumbar rotation stretch 1x5 seconds                                                                                                                                 PATIENT EDUCATION:  Education details: exam findings, POC, HEP  Person educated: Patient Education method: Explanation, Demonstration, and Handouts Education comprehension: verbalized understanding, returned demonstration, and needs further education  HOME EXERCISE PROGRAM: Access Code: [email or web address] URL: [email or web address] Date: 10/21/2023 Prepared by: Pauletta Browns  Exercises - Seated Hamstring Stretch  - 2 x daily - 7 x weekly - 1 sets - 2 reps - 30 seconds  hold - Supine Bridge  - 2 x daily - 7 x weekly - 1 sets - 10 reps - 5-10 seconds  hold - Supine Single Knee to Chest Stretch  - 2 x daily - 7 x weekly - 1 sets - 5-10 reps - 5 seconds  hold - Supine Piriformis Stretch with Leg Straight  - 2 x daily - 7 x weekly - 1 sets - 5-10 reps - 5 seconds  hold - Supine Lumbar Rotation Stretch  - 2 x daily - 7 x weekly - 1 sets - 5-10 reps - Supine Hamstring Stretch  - 2-3 x daily - 7 x weekly - 1 sets - 4-5 reps - 10-15 seconds hold - Supine Figure 4 Piriformis Stretch  - 2-3 x daily - 7 x weekly - 1 sets - 4-5 reps - 10-15 seconds  hold - Standing Lumbar Extension at Wall - Forearms  - 5 x daily - 7 x weekly - 1 sets - 5 reps - 3 seconds hold  ASSESSMENT:  CLINICAL IMPRESSION: Shannen reports improvements in her gluteal symptoms since needling.  Most of today was spent on education, reviewing her current HEP and making minor modifications to her current HEP with emphasis on most impaired areas.  We discussed possibly needling again next visit if she chooses.  Continue her current plan to meet long-term goals.  OBJECTIVE IMPAIRMENTS: decreased ROM, decreased strength, increased fascial restrictions, increased  muscle spasms, impaired flexibility, impaired sensation, postural dysfunction, and pain.   ACTIVITY LIMITATIONS: carrying, lifting, bending, sitting, standing, squatting, sleeping, stairs, and locomotion level  PARTICIPATION LIMITATIONS: driving, shopping, community activity, occupation, and church  PERSONAL FACTORS: Age, Fitness, Past/current experiences, and Time since onset of injury/illness/exacerbation are also affecting patient's functional outcome.   REHAB POTENTIAL: Good  CLINICAL DECISION MAKING: Stable/uncomplicated  EVALUATION COMPLEXITY: Low   GOALS: Goals reviewed with patient? No  SHORT TERM GOALS: Target date: 12/02/2023    Will be compliant with appropriate progressive HEP  Baseline: Goal status: On Going 10/11/2023  2.  Lumbar ROM limitations found at eval to have normalized Baseline:  Goal status: On Going 10/21/2023  3.  All mm flexibility impairments in LEs to have normalized  Baseline:  Goal status: On Going 10/21/2023    LONG TERM GOALS: Target date: 12/02/2023    MMT to have improved by at least grade in all weak groups  Baseline:  Goal status: INITIAL  2.  Will be able to perform all desired exercise programming without increase in pain  Baseline:  Goal status: INITIAL  3.  Radicular symptoms to have resolved  Baseline:  Goal status: INITIAL  4.  Pain to be no more than 2/10 at worst with all functional and gym based activities  Baseline:  Goal status: INITIAL  5.  FOTO score to be within 5 points of predicted by time of DC  Baseline:  Goal status: INITIAL    PLAN:  PT FREQUENCY: 1x/week  PT DURATION: 8 weeks  PLANNED INTERVENTIONS: 97164- PT Re-evaluation, 97110-Therapeutic exercises, 97530- Therapeutic activity, 97535- Self Care, 96045- Manual therapy, 97760- Orthotic Fit/training, [id number]- Aquatic Therapy, 97014- Electrical stimulation (unattended), [id number]- Traction (mechanical), Dry Needling, Cryotherapy, and Moist heat.  PLAN  FOR NEXT SESSION: Needling as desired by patient for symptoms.  Advance low back strengthening and review posture/body mechanics PRN.  Cherlyn Cushing PT, MPT 10/21/23  2:02 PM

## 2023-10-28 ENCOUNTER — Ambulatory Visit: Payer: BC Managed Care – PPO | Admitting: Rehabilitative and Restorative Service Providers"

## 2023-10-28 ENCOUNTER — Encounter: Payer: Self-pay | Admitting: Rehabilitative and Restorative Service Providers"

## 2023-10-28 DIAGNOSIS — M79604 Pain in right leg: Secondary | ICD-10-CM

## 2023-10-28 DIAGNOSIS — M6281 Muscle weakness (generalized): Secondary | ICD-10-CM | POA: Diagnosis not present

## 2023-10-28 NOTE — Therapy (Addendum)
 OUTPATIENT PHYSICAL THERAPY TREATMENT / DISCHARGE   Patient Name: Carly Miller MRN: [id number] DOB:1976-09-05, 48 y.o., female Today's Date: 10/28/2023  END OF SESSION:  PT End of Session - 10/28/23 1304     Visit Number 4    Number of Visits 9    Date for PT Re-Evaluation 12/02/23    Authorization Type BC[employer]    Authorization Time Period 10/07/23 to 12/02/23    PT Start Time 1302    PT Stop Time 1342    PT Time Calculation (min) 40 min    Activity Tolerance Patient tolerated treatment well    Behavior During Therapy [facility] for tasks assessed/performed              Past Medical History:  Diagnosis Date   Breast cancer (HCC)    Endometriosis    Hematuria    History of radiation therapy    Right Breast- 12/29/21-02/12/22- Dr. Lynwood Nasuti   Hyperlipidemia    Mass of ovary    RIGHT   Pelvic pain in female    Personal history of radiation therapy    Urgency of urination    UTI (lower urinary tract infection)    Past Surgical History:  Procedure Laterality Date   ABDOMINAL HYSTERECTOMY N/A 08/31/2013   Procedure: HYSTERECTOMY ABDOMINAL, LYSIS OF ADHESIONS;  Surgeon: Alm JAYSON Cook, MD;  Location: [facility] [place];  Service: Gynecology;  Laterality: N/A;   ABSCESS DRAINAGE  09/22/1999   RECTAL   ANAL SPHINCTEROTOMY  09/21/1998   BREAST BIOPSY Right 10/27/2021   BREAST LUMPECTOMY WITH RADIOACTIVE SEED AND SENTINEL LYMPH NODE BIOPSY Right 11/25/2021   Procedure: RIGHT BREAST LUMPECTOMY WITH RADIOACTIVE SEED AND SENTINEL LYMPH NODE BIOPSY;  Surgeon: Vanderbilt Ned, MD;  Location: [facility];  Service: General;  Laterality: Right;  90 MINUTES ROOM 8   DIAGNOSTIC LAPAROSCOPY LEFT OOPHORECTOMY  08/31/2007   ENDOMETRIOMA   LAPAROSCOPIC CHOLECYSTECTOMY  06/21/2012   LAPAROSCOPY N/A 08/31/2013   Procedure: LAPAROSCOPY DIAGNOSTIC;  Surgeon: Alm JAYSON Cook, MD;  Location: [facility];  Service: Gynecology;  Laterality: N/A;   Patient Active  Problem List   Diagnosis Date Noted   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 12/22/2021   Genetic testing 11/17/2021   Malignant neoplasm of upper-outer quadrant of right breast in female, estrogen receptor positive (HCC) 10/31/2021   S/P TAH (total abdominal hysterectomy) 08/31/2013   TOA (tubo-ovarian abscess) 08/31/2013   Endometrioma of ovary 08/31/2013   Pelvic adhesions 08/31/2013   S/P removal of right ovary 08/31/2013    PCP: Cook Alm MD   REFERRING PROVIDER: Joane Artist RAMAN, MD  REFERRING DIAG: [phone] (ICD-10-CM) - Chronic right hip pain M54.41,G89.29 (ICD-10-CM) - Chronic right-sided low back pain with right-sided sciatica   Rationale for Evaluation and Treatment: Rehabilitation  THERAPY DIAG:  Pain in right leg  Muscle weakness (generalized)  ONSET DATE: re-flared again a couple of months ago  SUBJECTIVE:  SUBJECTIVE STATEMENT: Pt indicated having pulling in leg but reduced severity of symptoms noted.  Pt indicated getting back to some of normal physical activity including walking and some hand weights.   PERTINENT HISTORY:  See above   PAIN:  NPRS scale:up to 2/10 in last few  Pain location: Mostly Rt lateral thigh to the foot Pain description: not intense now, discomfort  Aggravating factors: any activities, overdoing  Relieving factors: gabapentin    PRECAUTIONS: None  RED FLAGS: None   WEIGHT BEARING RESTRICTIONS: No  FALLS:  Has patient fallen in last 6 months? 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  DIAGNOSTIC FINDINGS:  10/07/2023 review:  IMPRESSION: Minimal degenerative joint changes of lumbar spine.  FINDINGS: There is no evidence of hip fracture or dislocation. There is no evidence of arthropathy or other focal bone abnormality.  FINDINGS: There is no evidence of lumbar spine fracture. Alignment is normal. Intervertebral disc spaces are maintained.    PATIENT SURVEYS:  10/28/2023:  FOTO update:  58  10/07/2023 FOTO 49, predicted 62 in 11 visits   COGNITION: 10/07/2023 Overall cognitive status: Within functional limits for tasks assessed     POSTURE: 10/07/2023  rounded shoulders, forward head, decreased lumbar lordosis, and flexed trunk   PALPATION: 10/07/2023 Rt lumbar paraspinals tight and sore, deep trigger point noted R piriformis, R HS and upper calf tight and tender    FLEXIBILITY 10/07/2023 Quad moderate limitation B HS severe limitation R, moderate limitation L Piriformis WNL R/mild limitation L   10/21/2023 Hip flexion: 100/100 Hamstrings: 35/35 Hip IR: 20/20 Hip ER: 17/18   LUMBAR ROM:   AROM 10/07/2023 10/12/2023 10/21/2023   Flexion Moderate limitation; RFIS no change      Extension Mild limitation; REIS increased pain  50 % WFL with tightness Rt hamstring noted 0   Right lateral flexion Mild limitation      Left lateral flexion Mild limitation      Right rotation      Left rotation       (Blank rows = not tested)    LOWER EXTREMITY MMT:    MMT Right 10/07/2023 Left 10/07/2023 Right 10/28/2023 Left 10/28/2023  Hip flexion 4+ 4+    Hip extension 3- 3- 4+/5 4+/5  Hip abduction 4 4 4+/5 4+/5  Hip adduction      Hip internal rotation      Hip external rotation      Knee flexion 4+ 4+    Knee extension 5 5    Ankle dorsiflexion 5 5    Ankle plantarflexion       Ankle inversion      Ankle eversion       (Blank rows = not tested)  LUMBAR SPECIAL TESTS:  10/07/2023 Straight leg raise test: Negative and R LE about 1/3 to 1/4 cm longer than the L   Slump test (-) R LE                      TREATMENT:          DATE: 10/28/2023 Manual  Compression with Rt glute med/min with skilled palpation with dry needling.  Percussive device to same.   Trigger Point Dry Needling Subsequent Treatment: Instructions provided previously at initial dry needling treatment.  Patient Verbal Consent Given: Yes Education Handout Provided: Previously Provided Muscles treated: Rt glute med/min Treatment response/outcome: local twitch response  Self Care Review of at home strategy for post manual/needling soreness control with heat/ice, massage gun use at home and general exercise routine.    Therex: Prone opposite arm/leg lift 3 sec hold x 10 bilateral  Nustep Lvl 5 10.5 mins LE Prone plank on knees to fatigue x 60 seconds Prone plank on toes to fatigue x 30 seconds Additional time spent for review of HEP and core strengthening additions.     TREATMENT:          DATE: 10/21/2023 Lumbar extension AROM 3 sets of 5 for 3 seconds Supine hamstrings stretch 5 x 20 seconds Figure 4 (push) stretch 5 x 20 seconds Yoga Bridge 10 x 5 seconds Verbally reviewed other HEP exercises and discussed the importance of maintaining a normal lumbar curve with seated activities  Functional Activities: Bed mobility (log roll), practical golfer's and diagonal squat lift techniques, reviewed spine anatomy, disc pressures in various positions, discussed walking 20+ minutes 3 days a week and reviewed basic spine education regarding avoiding flexion and rotation   TREATMENT:          DATE: 10/12/2023 Manual Compression with Rt glute med/min with skilled palpation with dry needling.  Percussive device to same.   Trigger Point Dry Needling Initial Treatment: Pt instructed on Dry Needling  rational, procedures, and possible side effects. Pt instructed to expect mild to moderate muscle soreness later in the day and/or into the next day.  Pt instructed in methods to reduce muscle soreness. Pt instructed to continue prescribed HEP. Patient verbalized understanding of these instructions and education.  Patient Verbal Consent Given: Yes Education Handout Provided: Yes Muscles treated: Rt glute med/min Treatment response/outcome: local twitch response  Therex: Nustep Lvl 5 8 mins UE/LE Hamstring stretch with foot elevated on step in standing  Standing hip abduction 2 x 10 bilateral  Supine bridge 2x10 SKC 5 sec hold x 5 on Rt Leg Piriformis figure 4 pull towards x 5    TREATMENT DATE:        DATE: 10/07/2023  Eval, care planning, HEP  Nustep L4 x6 minutes BLEs only  HS stretch RLE 1x30 seconds Bridges x10 SKTC 5x5 seconds R LE Supine piriformis stretch LE straight 1x5 seconds Lumbar rotation stretch 1x5 seconds                                                                                                                                 PATIENT EDUCATION:  Education details: exam findings, POC, HEP  Person educated: Patient Education method: Explanation, Demonstration, and Handouts Education comprehension: verbalized understanding, returned demonstration, and needs further education  HOME EXERCISE  PROGRAM: Access Code: [email or web address] URL: [email or web address] Date: 10/21/2023 Prepared by: Lamar Ivory  Exercises - Seated Hamstring Stretch  - 2 x daily - 7 x weekly - 1 sets - 2 reps - 30 seconds  hold - Supine Bridge  - 2 x daily - 7 x weekly - 1 sets - 10 reps - 5-10 seconds  hold - Supine Single Knee to Chest Stretch  - 2 x daily - 7 x weekly - 1 sets - 5-10 reps - 5 seconds  hold - Supine Piriformis Stretch with Leg Straight  - 2 x daily - 7 x weekly - 1 sets - 5-10 reps - 5 seconds  hold - Supine Lumbar Rotation Stretch  - 2 x daily - 7 x weekly -  1 sets - 5-10 reps - Supine Hamstring Stretch  - 2-3 x daily - 7 x weekly - 1 sets - 4-5 reps - 10-15 seconds hold - Supine Figure 4 Piriformis Stretch  - 2-3 x daily - 7 x weekly - 1 sets - 4-5 reps - 10-15 seconds hold - Standing Lumbar Extension at Wall - Forearms  - 5 x daily - 7 x weekly - 1 sets - 5 reps - 3 seconds hold  ASSESSMENT:  CLINICAL IMPRESSION: FOTO reassessment showed improvement.  Pt has reported and demonstrated good improvement in symptoms with use of manual and dry needling inclusion.  Progress noted in strength measured.  Plan to follow up in 2 weeks and assess HEP transferring as able.   OBJECTIVE IMPAIRMENTS: decreased ROM, decreased strength, increased fascial restrictions, increased muscle spasms, impaired flexibility, impaired sensation, postural dysfunction, and pain.   ACTIVITY LIMITATIONS: carrying, lifting, bending, sitting, standing, squatting, sleeping, stairs, and locomotion level  PARTICIPATION LIMITATIONS: driving, shopping, community activity, occupation, and church  PERSONAL FACTORS: Age, Fitness, Past/current experiences, and Time since onset of injury/illness/exacerbation are also affecting patient's functional outcome.   REHAB POTENTIAL: Good  CLINICAL DECISION MAKING: Stable/uncomplicated  EVALUATION COMPLEXITY: Low   GOALS: Goals reviewed with patient? No  SHORT TERM GOALS: Target date: 12/02/2023    Will be compliant with appropriate progressive HEP  Baseline: Goal status: Met   2.  Lumbar ROM limitations found at eval to have normalized Baseline:  Goal status: On Going 10/21/2023  3.  All mm flexibility impairments in LEs to have normalized  Baseline:  Goal status: On Going 10/21/2023    LONG TERM GOALS: Target date: 12/02/2023    MMT to have improved by at least grade in all weak groups  Baseline:  Goal status: INITIAL  2.  Will be able to perform all desired exercise programming without increase in pain  Baseline:  Goal  status: INITIAL  3.  Radicular symptoms to have resolved  Baseline:  Goal status: INITIAL  4.  Pain to be no more than 2/10 at worst with all functional and gym based activities  Baseline:  Goal status: INITIAL  5.  FOTO score to be within 5 points of predicted by time of DC  Baseline:  Goal status: INITIAL    PLAN:  PT FREQUENCY: 1x/week  PT DURATION: 8 weeks  PLANNED INTERVENTIONS: 97164- PT Re-evaluation, 97110-Therapeutic exercises, 97530- Therapeutic activity, 97535- Self Care, 02859- Manual therapy, 97760- Orthotic Fit/training, [id number]- Aquatic Therapy, 97014- Electrical stimulation (unattended), [id number]- Traction (mechanical), Dry Needling, Cryotherapy, and Moist heat.  PLAN FOR NEXT SESSION: Review time since visit, DN as necessary   Ozell Silvan, PT, DPT, OCS, ATC 10/28/23  2:05 PM  PHYSICAL THERAPY DISCHARGE SUMMARY  Visits from Start of Care: 4  Current functional level related to goals / functional outcomes: See note   Remaining deficits: See note   Education / Equipment: HEP  Patient goals were  unknown . Patient is being discharged due to not returning since the last visit.  Had generally reported improvement per review of previous notes.   Ozell Silvan, PT, DPT, OCS, ATC 01/05/24  8:15 AM

## 2023-10-29 ENCOUNTER — Encounter: Payer: BC Managed Care – PPO | Admitting: Physical Therapy

## 2023-11-04 ENCOUNTER — Encounter: Payer: BC Managed Care – PPO | Admitting: Rehabilitative and Restorative Service Providers"

## 2023-11-05 ENCOUNTER — Encounter: Payer: BC Managed Care – PPO | Admitting: Physical Therapy

## 2023-11-12 ENCOUNTER — Encounter: Payer: BC Managed Care – PPO | Admitting: Rehabilitative and Restorative Service Providers"

## 2023-11-17 ENCOUNTER — Encounter: Payer: Self-pay | Admitting: Adult Health

## 2023-11-24 NOTE — Progress Notes (Unsigned)
   Rubin Payor, PhD, LAT, ATC acting as a scribe for Clementeen Graham, MD.  Carly Miller is a 48 y.o. female who presents to [facility] today for f/u low back pain w/ radiating pain into R leg. Pt was last seen by Dr. Denyse Amass on 09/30/23 and was referred to PT, completing 4 visits.  Today, pt reports much benefit from the dry needling at PT. LBP is much better. Radiating pain has resolved.  She no longer requires gabapentin.  Dx imaging: 09/30/23 L-spine XR 04/07/22 L-spine & R hip XR  Pertinent review of systems: No fevers or chills  Relevant historical information: Breast cancer   Exam:  BP 118/76   Pulse 98   Ht 5\' 6"  (1.676 m)   Wt 187 lb (84.8 kg)   LMP 08/10/2013   SpO2 99%   BMI 30.18 kg/m  General: Well Developed, well nourished, and in no acute distress.   MSK: Lumbar spine normal motion normal gait.    Assessment and Plan: 49 y.o. female with resolved hip and back pain with pain radiating down the leg.  Patient had significant benefit with physical therapy.  Last visit was in February it has been about a month.  Continue home exercise program and check back with me as needed. Could authorize more PT in the future if needed.  PDMP not reviewed this encounter. No orders of the defined types were placed in this encounter.  No orders of the defined types were placed in this encounter.    Discussed warning signs or symptoms. Please see discharge instructions. Patient expresses understanding.   The above documentation has been reviewed and is accurate and complete Clementeen Graham, M.D.

## 2023-11-25 ENCOUNTER — Ambulatory Visit: Payer: BC Managed Care – PPO | Admitting: Family Medicine

## 2023-11-25 VITALS — BP 118/76 | HR 98 | Ht 66.0 in | Wt 187.0 lb

## 2023-11-25 DIAGNOSIS — G8929 Other chronic pain: Secondary | ICD-10-CM

## 2023-11-25 DIAGNOSIS — M5441 Lumbago with sciatica, right side: Secondary | ICD-10-CM | POA: Diagnosis not present

## 2023-11-25 DIAGNOSIS — M25551 Pain in right hip: Secondary | ICD-10-CM

## 2023-11-25 NOTE — Patient Instructions (Addendum)
Thank you for coming in today.   Check back as needed 

## 2023-12-13 ENCOUNTER — Ambulatory Visit: Payer: BC Managed Care – PPO | Attending: Surgery

## 2023-12-13 VITALS — Wt 177.0 lb

## 2023-12-13 DIAGNOSIS — Z483 Aftercare following surgery for neoplasm: Secondary | ICD-10-CM

## 2023-12-13 NOTE — Therapy (Signed)
 OUTPATIENT PHYSICAL THERAPY SOZO SCREENING NOTE   Patient Name: Carly Miller MRN: [id number] DOB:[birth year], 48 y.o., female Today's Date: 12/13/2023  PCP: [name], MD REFERRING PROVIDER: Harriette Bouillon, MD   PT End of Session - 12/13/23 [phone]     Visit Number 4   3 unchanged due to screen only   PT Start Time 0846    PT Stop Time 0849    PT Time Calculation (min) 3 min    Activity Tolerance Patient tolerated treatment well    Behavior During Therapy [facility] for tasks assessed/performed             Past Medical History:  Diagnosis Date   Breast cancer (HCC)    Endometriosis    Hematuria    History of radiation therapy    Right Breast- 12/29/21-02/12/22- Dr. Antony Blackbird   Hyperlipidemia    Mass of ovary    RIGHT   Pelvic pain in female    Personal history of radiation therapy    Urgency of urination    UTI (lower urinary tract infection)    Past Surgical History:  Procedure Laterality Date   ABDOMINAL HYSTERECTOMY N/A 08/31/2013   Procedure: HYSTERECTOMY ABDOMINAL, LYSIS OF ADHESIONS;  Surgeon: Turner Daniels, MD;  Location: [facility];  Service: Gynecology;  Laterality: N/A;   ABSCESS DRAINAGE  09/22/1999   RECTAL   ANAL SPHINCTEROTOMY  09/21/1998   BREAST BIOPSY Right 10/27/2021   BREAST LUMPECTOMY WITH RADIOACTIVE SEED AND SENTINEL LYMPH NODE BIOPSY Right 11/25/2021   Procedure: RIGHT BREAST LUMPECTOMY WITH RADIOACTIVE SEED AND SENTINEL LYMPH NODE BIOPSY;  Surgeon: Harriette Bouillon, MD;  Location: [facility];  Service: General;  Laterality: Right;  90 MINUTES ROOM 8   DIAGNOSTIC LAPAROSCOPY LEFT OOPHORECTOMY  08/31/2007   ENDOMETRIOMA   LAPAROSCOPIC CHOLECYSTECTOMY  06/21/2012   LAPAROSCOPY N/A 08/31/2013   Procedure: LAPAROSCOPY DIAGNOSTIC;  Surgeon: Turner Daniels, MD;  Location: [facility];  Service: Gynecology;  Laterality: N/A;   Patient Active Problem List   Diagnosis Date Noted   Female pelvic inflammatory  disease 12/22/2021   Genetic testing 11/17/2021   Malignant neoplasm of upper-outer quadrant of right breast in female, estrogen receptor positive (HCC) 10/31/2021   S/P TAH (total abdominal hysterectomy) 08/31/2013   TOA (tubo-ovarian abscess) 08/31/2013   Endometrioma of ovary 08/31/2013   Pelvic adhesions 08/31/2013   S/P removal of right ovary 08/31/2013    REFERRING DIAG: right breast cancer at risk for lymphedema  THERAPY DIAG:  Aftercare following surgery for neoplasm  PERTINENT HISTORY: Patient was diagnosed on 10/10/2021 with 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 She underwent a right lumpectomy and sentinel node biopsy (4 negative nodes) on 11/25/2021. It is ER/PR positive and HER2 negative with a Ki67 of < 5%. She had breast implants placed in 2016.   PRECAUTIONS: right UE Lymphedema risk, None  SUBJECTIVE: Pt returns for her 3 month L-Dex screen.   PAIN:  Are you having pain? No  SOZO SCREENING: Patient was assessed today using the SOZO machine to determine the lymphedema index score. This was compared to her baseline score. It was determined that she is within the recommended range when compared to her baseline and no further action is needed at this time. She will continue SOZO screenings. These are done every 3 months for 2 years post operatively followed by every 6 months for 2 years, and then annually.   L-DEX FLOWSHEETS - 12/13/23 0800  L-DEX LYMPHEDEMA SCREENING   Measurement Type Unilateral    L-DEX MEASUREMENT EXTREMITY Upper Extremity    POSITION  Standing    DOMINANT SIDE Right    At Risk Side Right    BASELINE SCORE (UNILATERAL) 1.7    L-DEX SCORE (UNILATERAL) 0    VALUE CHANGE (UNILAT) -1.7               Hermenia Bers, PTA 12/13/2023, 8:51 AM

## 2024-01-19 ENCOUNTER — Other Ambulatory Visit: Payer: Self-pay | Admitting: Hematology and Oncology

## 2024-01-24 ENCOUNTER — Inpatient Hospital Stay: Payer: BC Managed Care – PPO | Attending: Hematology and Oncology | Admitting: Hematology and Oncology

## 2024-01-24 ENCOUNTER — Encounter: Payer: Self-pay | Admitting: Hematology and Oncology

## 2024-01-24 VITALS — BP 128/78 | HR 73 | Temp 97.9°F | Resp 16 | Wt 177.4 lb

## 2024-01-24 DIAGNOSIS — Z1721 Progesterone receptor positive status: Secondary | ICD-10-CM | POA: Diagnosis not present

## 2024-01-24 DIAGNOSIS — Z79810 Long term (current) use of selective estrogen receptor modulators (SERMs): Secondary | ICD-10-CM | POA: Insufficient documentation

## 2024-01-24 DIAGNOSIS — C50411 Malignant neoplasm of upper-outer quadrant of right female breast: Secondary | ICD-10-CM | POA: Insufficient documentation

## 2024-01-24 DIAGNOSIS — Z923 Personal history of irradiation: Secondary | ICD-10-CM | POA: Diagnosis not present

## 2024-01-24 DIAGNOSIS — Z170 Estrogen receptor positive status [ER+]: Secondary | ICD-10-CM | POA: Insufficient documentation

## 2024-01-24 DIAGNOSIS — Z1732 Human epidermal growth factor receptor 2 negative status: Secondary | ICD-10-CM | POA: Insufficient documentation

## 2024-01-24 NOTE — Progress Notes (Signed)
 [facility] Cancer Follow up:    Belle Box, MD [address], [address] [place] [address]   DIAGNOSIS:  Cancer Staging  Malignant neoplasm of upper-outer quadrant of right breast in female, estrogen receptor positive (HCC) Staging form: Breast, AJCC 8th Edition - Clinical: Stage IA (cT1c, cN0, cM0, G2, ER+, PR+, HER2-) - Signed by Murleen Arms, MD on 11/05/2021 Stage prefix: Initial diagnosis Histologic grading system: 3 grade system   SUMMARY OF ONCOLOGIC HISTORY: Oncology History  Malignant neoplasm of upper-outer quadrant of right breast in female, estrogen receptor positive (HCC)  10/10/2021 Mammogram   Indeterminate right breast mass at the 11 o'clock position corresponding to the patient, recommend ultrasound-guided biopsy of the, no suspicious right axillary lymphadenopathy.   Targeted ultrasound is performed, showing an irregular, hypoechoic mass with associated vascularity at the 11 o'clock position 2 cm from the nipple. It measures 1.9 x 1.6 x 1.0 cm. This corresponds with the patient's palpable lump. Evaluation of the right axilla demonstrates no suspicious lymphadenopathy.   10/31/2021 Initial Diagnosis   Malignant neoplasm of upper-outer quadrant of right breast in female, estrogen receptor positive (HCC)   11/05/2021 Cancer Staging   Staging form: Breast, AJCC 8th Edition - Clinical: Stage IA (cT1c, cN0, cM0, G2, ER+, PR+, HER2-) - Signed by Murleen Arms, MD on 11/05/2021 Stage prefix: Initial diagnosis Histologic grading system: 3 grade system    Genetic Testing   [facility] CancerNext-Expanded Panel was Negative. Report date is 11/17/2021.  The CancerNext-Expanded gene panel offered by [facility] and includes sequencing, rearrangement, and RNA analysis for the following 77 genes: AIP, ALK, APC, ATM, AXIN2, BAP1, BARD1, BLM, BMPR1A, BRCA1, BRCA2, BRIP1, CDC73, CDH1, CDK4, CDKN1B, CDKN2A, CHEK2, CTNNA1, DICER1, FANCC, FH, FLCN, GALNT12,  KIF1B, LZTR1, MAX, MEN1, MET, MLH1, MSH2, MSH3, MSH6, MUTYH, NBN, NF1, NF2, NTHL1, PALB2, PHOX2B, PMS2, POT1, PRKAR1A, PTCH1, PTEN, RAD51C, RAD51D, RB1, RECQL, RET, SDHA, SDHAF2, SDHB, SDHC, SDHD, SMAD4, SMARCA4, SMARCB1, SMARCE1, STK11, SUFU, TMEM127, TP53, TSC1, TSC2, VHL and XRCC2 (sequencing and deletion/duplication); EGFR, EGLN1, HOXB13, KIT, MITF, PDGFRA, POLD1, and POLE (sequencing only); EPCAM and GREM1 (deletion/duplication only).     Pathology Results    Biopsy of the area showed IDC, grade 1-2, ER positive 100% moderate staining, PR positive 100% strong staining, Her 2 neg. Ki 5%.   11/25/2021 Surgery   Right breast lumpectomy showed invasive ductal carcinoma, 2 cm, grade 1, intermediate grade DCIS, 4 out of 4 sentinel lymph nodes without any micrometastasis, prior prognostics showed ER 100% positive strong staining, PR 100% positive strong staining, HER2 negative, Ki-67 less than 5%   11/25/2021 Oncotype testing   17/5%   12/29/2021 - 02/12/2022 Radiation Therapy   12/29/2021 through 02/12/2022 Site Technique Total Dose (Gy) Dose per Fx (Gy) Completed Fx Beam Energies  Breast, Right: Breast_R 3D 50.4/50.4 1.8 28/28 6X  Breast, Right: Breast_R_Bst specialPort 10/10 2 5/5 6E     01/2022 -  Anti-estrogen oral therapy   Tamoxifen  daily     CURRENT THERAPY: Tamoxifen   INTERVAL HISTORY:  Carly Miller 48 y.o. female returns for f/u  Discussed the use of AI scribe software for clinical note transcription with the patient, who gave verbal consent to proceed.  History of Present Illness    The patient, with a history of breast cancer, presents for follow-up. She reports a recent rash on her breast that has since resolved with a topical cream. The rash's etiology remains unknown, but it is not believed to be related  to her oral contraceptive use.  The patient continues to take tamoxifen  for her breast cancer and reports cognitive side effects, including memory fogginess. This has impacted  her work, necessitating her to take work home. She has been offered participation in a cognitive study run by [facility], this has helped some.  The patient also mentions an upcoming mammogram scheduled for November 11th. She has not noticed any changes in her breasts or any new symptoms.  Patient Active Problem List   Diagnosis Date Noted   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 12/22/2021   Genetic testing 11/17/2021   Malignant neoplasm of upper-outer quadrant of right breast in female, estrogen receptor positive (HCC) 10/31/2021   S/P TAH (total abdominal hysterectomy) 08/31/2013   TOA (tubo-ovarian abscess) 08/31/2013   Endometrioma of ovary 08/31/2013   Pelvic adhesions 08/31/2013   S/P removal of right ovary 08/31/2013    is allergic to naproxen sodium and naproxen.  MEDICAL HISTORY: Past Medical History:  Diagnosis Date   Breast cancer (HCC)    Endometriosis    Hematuria    History of radiation therapy    Right Breast- 12/29/21-02/12/22- Dr. Retta Caster   Hyperlipidemia    Mass of ovary    RIGHT   Pelvic pain in female    Personal history of radiation therapy    Urgency of urination    UTI (lower urinary tract infection)     SURGICAL HISTORY: Past Surgical History:  Procedure Laterality Date   ABDOMINAL HYSTERECTOMY N/A 08/31/2013   Procedure: HYSTERECTOMY ABDOMINAL, LYSIS OF ADHESIONS;  Surgeon: Denette Finner, MD;  Location: [facility];  Service: Gynecology;  Laterality: N/A;   ABSCESS DRAINAGE  09/22/1999   RECTAL   ANAL SPHINCTEROTOMY  09/21/1998   BREAST BIOPSY Right 10/27/2021   BREAST LUMPECTOMY WITH RADIOACTIVE SEED AND SENTINEL LYMPH NODE BIOPSY Right 11/25/2021   Procedure: RIGHT BREAST LUMPECTOMY WITH RADIOACTIVE SEED AND SENTINEL LYMPH NODE BIOPSY;  Surgeon: Sim Dryer, MD;  Location: [facility];  Service: General;  Laterality: Right;  90 MINUTES ROOM 8   DIAGNOSTIC LAPAROSCOPY LEFT OOPHORECTOMY  08/31/2007    ENDOMETRIOMA   LAPAROSCOPIC CHOLECYSTECTOMY  06/21/2012   LAPAROSCOPY N/A 08/31/2013   Procedure: LAPAROSCOPY DIAGNOSTIC;  Surgeon: Denette Finner, MD;  Location: [facility];  Service: Gynecology;  Laterality: N/A;    SOCIAL HISTORY: Social History   Socioeconomic History   Marital status: Married    Spouse name: Not on file   Number of children: Not on file   Years of education: Not on file   Highest education level: Not on file  Occupational History   Not on file  Tobacco Use   Smoking status: Never   Smokeless tobacco: Never  Vaping Use   Vaping status: Never Used  Substance and Sexual Activity   Alcohol use: No   Drug use: No   Sexual activity: Not on file  Other Topics Concern   Not on file  Social History Narrative   Not on file   Social Drivers of Health   Financial Resource Strain: Low Risk  (11/05/2021)   Overall Financial Resource Strain (CARDIA)    Difficulty of Paying Living Expenses: Not hard at all  Food Insecurity: No Food Insecurity (11/05/2021)   Hunger Vital Sign    Worried About Running Out of Food in the Last Year: Never true    Ran Out of Food in the Last Year: Never true  Transportation Needs: No Transportation Needs (  11/05/2021)   PRAPARE - Administrator, Civil Service (Medical): No    Lack of Transportation (Non-Medical): No  Physical Activity: Not on file  Stress: Not on file  Social Connections: Unknown (01/19/2022)   Received from [facility]   Social Network    Social Network: Not on file  Intimate Partner Violence: Unknown (12/24/2021)   Received from [facility]   HITS    Physically Hurt: Not on file    Insult or Talk Down To: Not on file    Threaten Physical Harm: Not on file    Scream or Curse: Not on file    FAMILY HISTORY: Family History  Problem Relation Age of Onset   Asthma Mother    Thyroid disease Mother    Asthma Father    Diabetes Father    Thyroid disease  Father    Kidney disease Sister    Thyroid disease Maternal Aunt    Thyroid disease Maternal Uncle    Leukemia Maternal Grandmother        dx. 48s   Breast cancer Neg Hx    Colon cancer Neg Hx    Colon polyps Neg Hx    Esophageal cancer Neg Hx    Stomach cancer Neg Hx    Rectal cancer Neg Hx     Review of Systems  Constitutional:  Negative for appetite change, chills, fatigue, fever and unexpected weight change.  HENT:   Negative for hearing loss, lump/mass and trouble swallowing.   Eyes:  Negative for eye problems and icterus.  Respiratory:  Negative for chest tightness, cough and shortness of breath.   Cardiovascular:  Negative for chest pain, leg swelling and palpitations.  Gastrointestinal:  Negative for abdominal distention, abdominal pain, constipation, diarrhea, nausea and vomiting.  Endocrine: Negative for hot flashes.  Genitourinary:  Negative for difficulty urinating.   Musculoskeletal:  Positive for myalgias. Negative for arthralgias.  Skin:  Positive for rash. Negative for itching.  Neurological:  Negative for dizziness, extremity weakness, headaches and numbness.  Hematological:  Negative for adenopathy. Does not bruise/bleed easily.  Psychiatric/Behavioral:  Negative for depression. The patient is not nervous/anxious.      PHYSICAL EXAMINATION     Vitals:   01/24/24 1136  BP: 128/78  Pulse: 73  Resp: 16  Temp: 97.9 F (36.6 C)  SpO2: 100%    Physical Exam Constitutional:      General: She is not in acute distress.    Appearance: Normal appearance. She is not toxic-appearing.  HENT:     Head: Normocephalic and atraumatic.     Mouth/Throat:     Mouth: Mucous membranes are moist.     Pharynx: Oropharynx is clear. No oropharyngeal exudate or posterior oropharyngeal erythema.  Eyes:     General: No scleral icterus. Chest:     Comments: Bilateral breasts inspected and palpated.  Hyperpigmentation near the right areola appears stable.  No palpable  masses.  Implants appear intact.  No regional adenopathy. Abdominal:     General: Abdomen is flat. Bowel sounds are normal. There is no distension.     Palpations: Abdomen is soft.     Tenderness: There is no abdominal tenderness.  Musculoskeletal:     Cervical back: Neck supple.  Skin:    General: Skin is warm and dry.  Neurological:     General: No focal deficit present.     Mental Status: She is alert.  Psychiatric:  Mood and Affect: Mood normal.        Behavior: Behavior normal.      ASSESSMENT and THERAPY PLAN:   Malignant neoplasm of upper-outer quadrant of right breast in female, estrogen receptor positive (HCC) 48 year old woman with history of stage IA right breast IDC ER/PR+ diagnosed in 10/2021 s/p lumpectomy, adjuvant radiation, and antiestrogen therapy with tamoxifen  that she began in 01/2022.    Assessment & Plan Invasive breast cancer On tamoxifen  with memory issues and severe hot flashes. Normal mammogram in November. Discussed tamoxifen  side effects, including blood clot and bleeding risks. Considered dose reduction to assess symptom impact. Alternatives discussed such as aromatase inhibitors ( She had TAH) - Reduce tamoxifen  to 10 mg daily. - Order mammogram for November. - Continue monthly breast self-exams.  Hot flashes Severe hot flashes managed with black cohosh. Discussed black label warning for hepatotoxicity. Gabapentin  may help but taken only at night. Consider prescription effexor if symptoms persist post-tamoxifen  dose reduction. - Monitor hot flashes post-tamoxifen  dose reduction.  Memory issues Ongoing memory issues possibly related to tamoxifen . Participated in study with some word recall improvement. Considered tamoxifen  dose reduction to assess impact. - Reduce tamoxifen  to 10 mg daily.  Numbness and leg pain Symptoms improved with gabapentin , taken at night due to drowsiness. Gabapentin  may also help with hot flashes.  Urinary tract  infection Recent UTI with cramping and dark urine. On nitrofurantoin , awaiting culture results. Discussed importance of culture for antibiotic sensitivity. - Follow up on urine culture results. - Consider changing antibiotic if resistant to nitrofurantoin . - She will call her PCP  Total hysterectomy Considered postmenopausal. Discussed alternative medications to tamoxifen  if side effects intolerable despite dose reduction.     All questions were answered. The patient knows to call the clinic with any problems, questions or concerns. We can certainly see the patient much sooner if necessary.  Total encounter time:30 minutes*in face-to-face visit time, chart review, lab review, care coordination, order entry, and documentation of the encounter time.    *Total Encounter Time as defined by the Centers for Medicare and Medicaid Services includes, in addition to the face-to-face time of a patient visit (documented in the note above) non-face-to-face time: obtaining and reviewing outside history, ordering and reviewing medications, tests or procedures, care coordination (communications with other health care professionals or caregivers) and documentation in the medical record.

## 2024-01-24 NOTE — Assessment & Plan Note (Signed)
 48 year old woman with history of stage IA right breast IDC ER/PR+ diagnosed in 10/2021 s/p lumpectomy, adjuvant radiation, and antiestrogen therapy with tamoxifen  that she began in 01/2022.    Assessment & Plan Invasive breast cancer On tamoxifen  with memory issues and severe hot flashes. Normal mammogram in November. Discussed tamoxifen  side effects, including blood clot and bleeding risks. Considered dose reduction to assess symptom impact. Alternatives discussed such as aromatase inhibitors ( She had TAH) - Reduce tamoxifen  to 10 mg daily. - Order mammogram for November. - Continue monthly breast self-exams.  Hot flashes Severe hot flashes managed with black cohosh. Discussed black label warning for hepatotoxicity. Gabapentin  may help but taken only at night. Consider prescription effexor if symptoms persist post-tamoxifen  dose reduction. - Monitor hot flashes post-tamoxifen  dose reduction.  Memory issues Ongoing memory issues possibly related to tamoxifen . Participated in study with some word recall improvement. Considered tamoxifen  dose reduction to assess impact. - Reduce tamoxifen  to 10 mg daily.  Numbness and leg pain Symptoms improved with gabapentin , taken at night due to drowsiness. Gabapentin  may also help with hot flashes.  Urinary tract infection Recent UTI with cramping and dark urine. On nitrofurantoin , awaiting culture results. Discussed importance of culture for antibiotic sensitivity. - Follow up on urine culture results. - Consider changing antibiotic if resistant to nitrofurantoin . - She will call her PCP  Total hysterectomy Considered postmenopausal. Discussed alternative medications to tamoxifen  if side effects intolerable despite dose reduction.

## 2024-01-25 ENCOUNTER — Encounter: Payer: Self-pay | Admitting: Hematology and Oncology

## 2024-02-07 ENCOUNTER — Encounter: Payer: Self-pay | Admitting: Family Medicine

## 2024-02-07 DIAGNOSIS — G8929 Other chronic pain: Secondary | ICD-10-CM

## 2024-02-09 NOTE — Telephone Encounter (Signed)
 Forwarding to Dr. Alease Hunter to review and advise regarding appropriate MRI order for sx and pt concerns.

## 2024-02-10 NOTE — Addendum Note (Signed)
 Addended by: Syliva Even on: 02/10/2024 07:25 AM   Modules accepted: Orders

## 2024-02-16 ENCOUNTER — Ambulatory Visit
Admission: RE | Admit: 2024-02-16 | Discharge: 2024-02-16 | Disposition: A | Discharge status: Home / Self Care | Source: Ambulatory Visit | Attending: Family Medicine | Admitting: Family Medicine

## 2024-02-16 DIAGNOSIS — G8929 Other chronic pain: Secondary | ICD-10-CM

## 2024-02-20 IMAGING — MG MM PLC BREAST LOC DEV 1ST LESION INC*R*
8 of 12 series · 8 of 12 positions shown · non-contrast
Comparison: Previous exam(s).

CLINICAL DATA: Radioactive seed placement prior to surgery. Biopsy
proven invasive ductal carcinoma in the right breast (ribbon shaped
clip).

EXAM:
MAMMOGRAPHIC GUIDED RADIOACTIVE SEED LOCALIZATION OF THE RIGHT
BREAST

[R LM (1 of 3)]
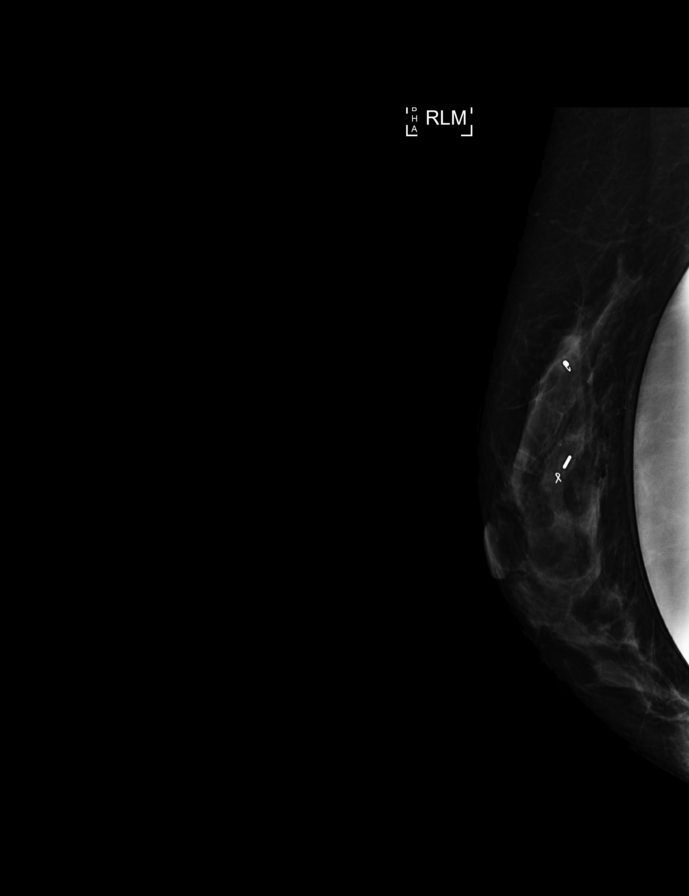

[R CC (1 of 5)]
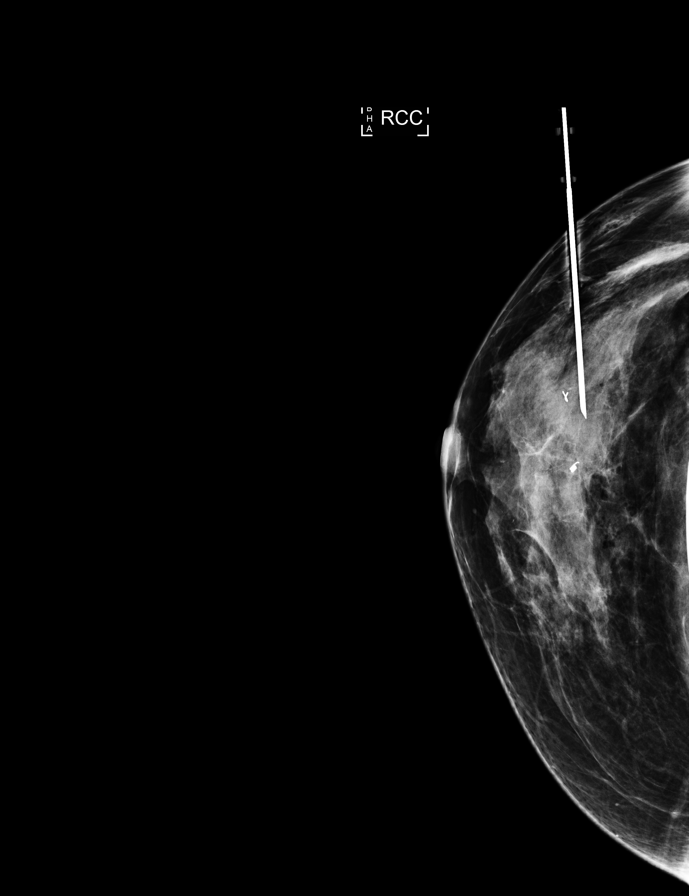

[R LM (2 of 3)]
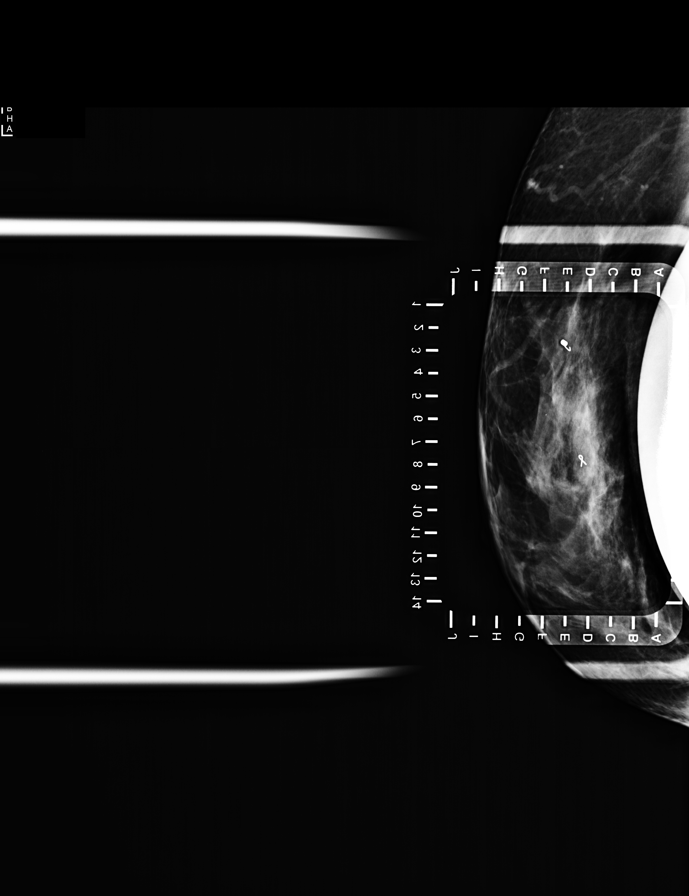

[R CC (2 of 5)]
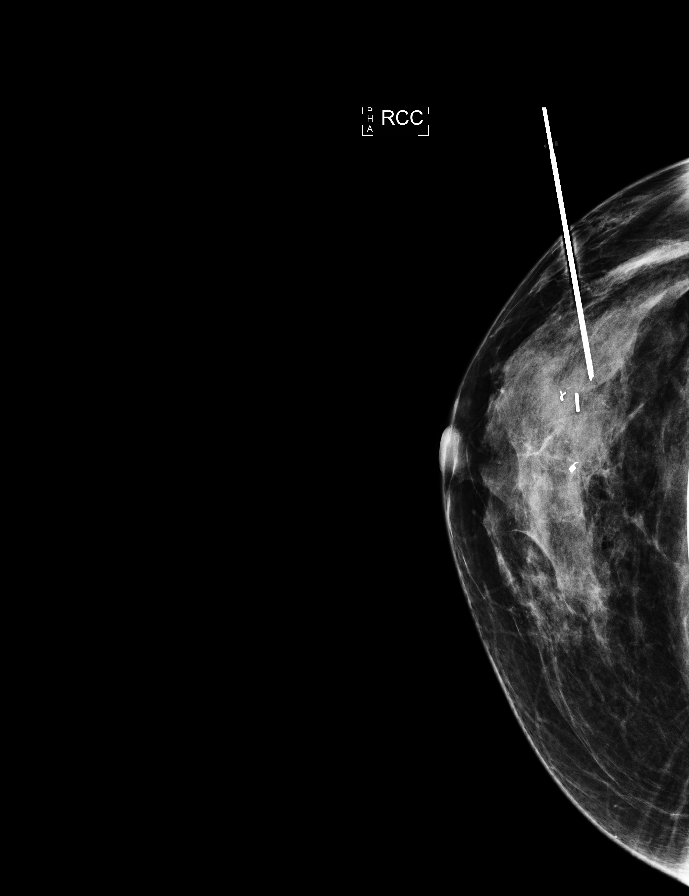

[R CC (3 of 5)]
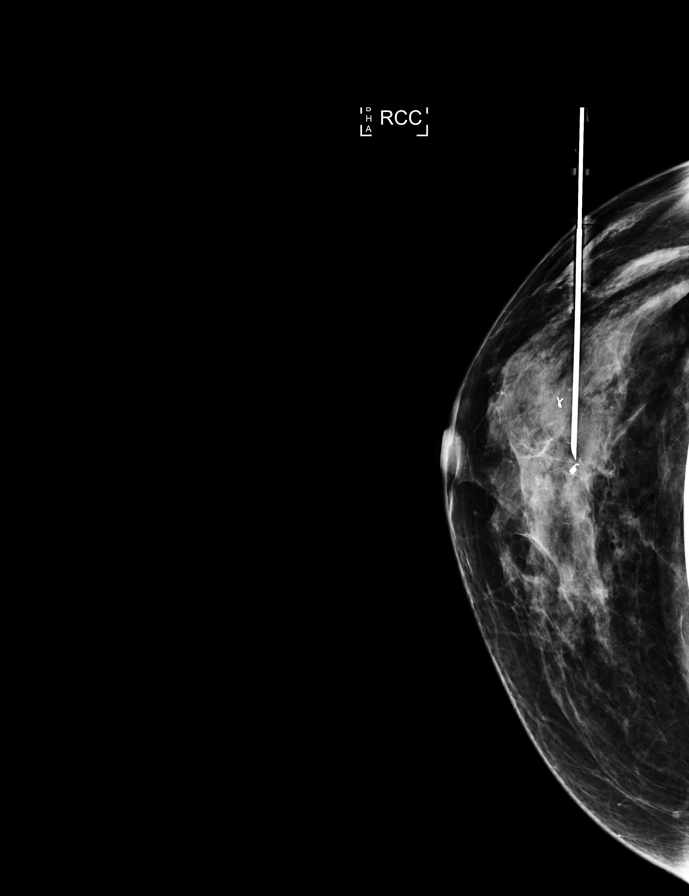

[R CC (4 of 5)]
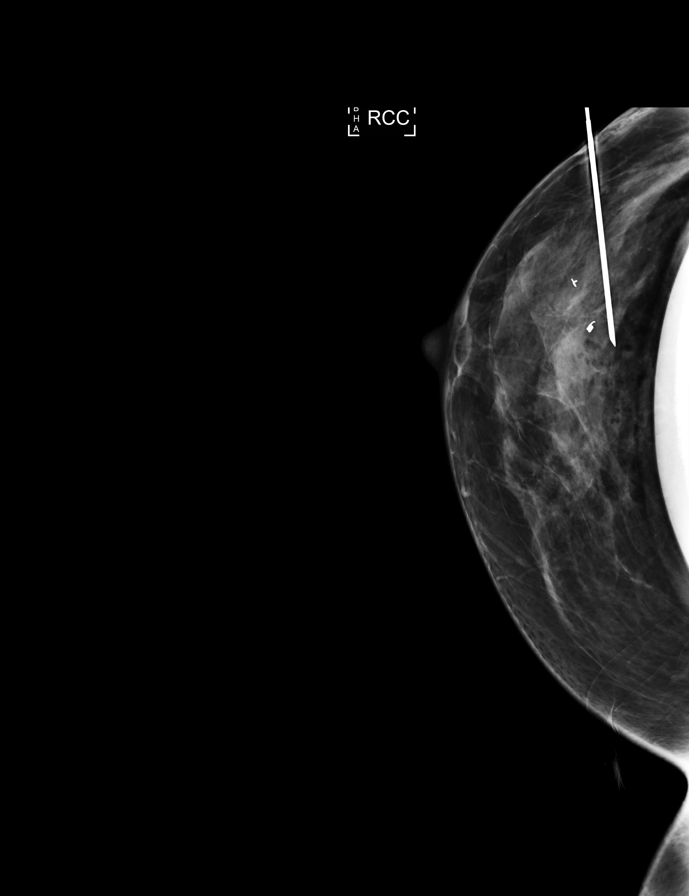

[R CC (5 of 5)]
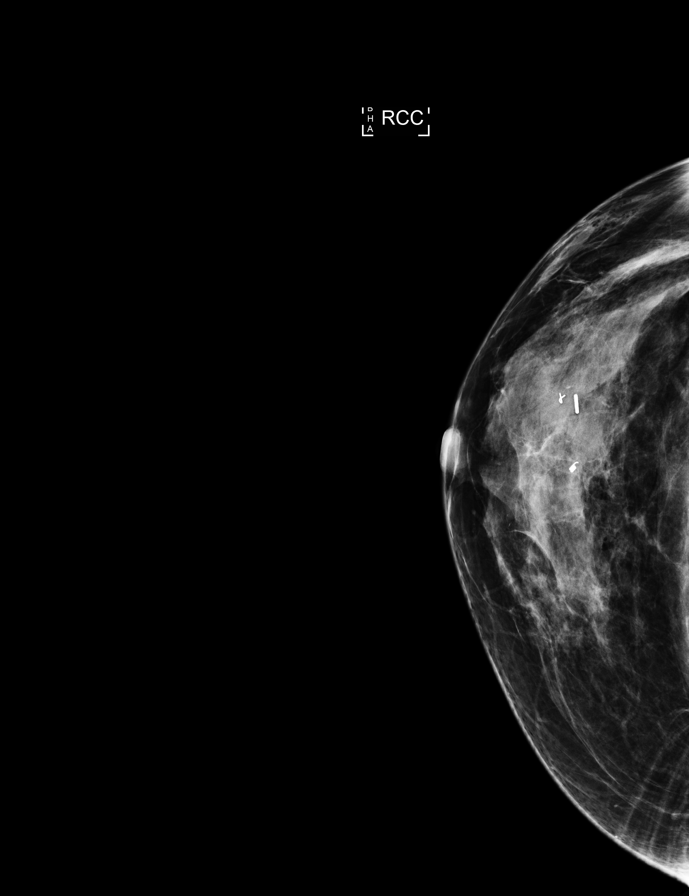

[R LM (3 of 3)]
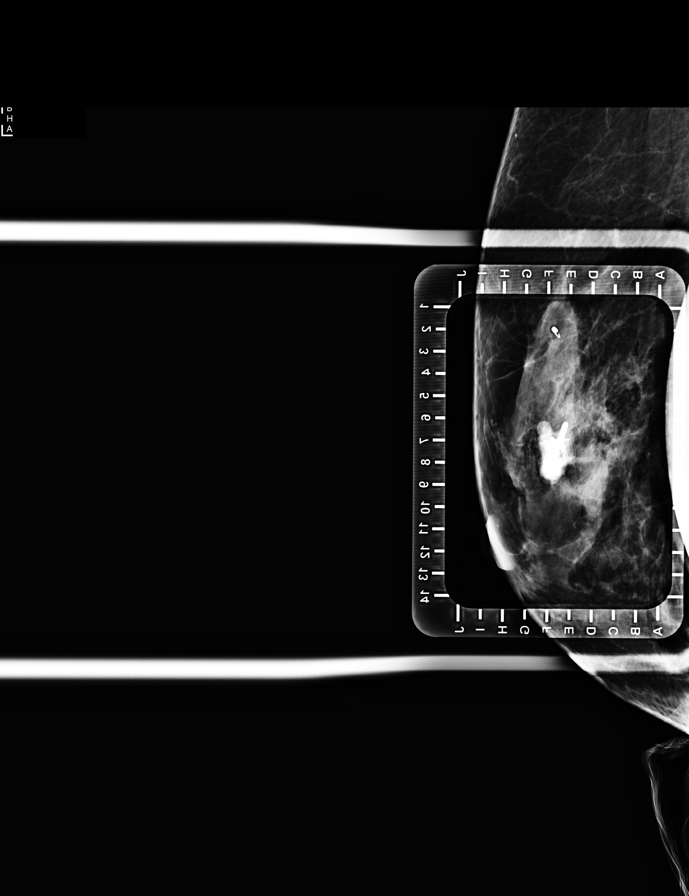

[8 of 12 positions shown; findings below may reference images not displayed]



The usual time-out protocol was performed immediately prior to the
procedure.

Using mammographic guidance, sterile technique, 1% lidocaine and an
[id number] radioactive seed, the ribbon shaped clip was localized using a
lateral to medial approach. The follow-up mammogram images confirm
the seed in the expected location and were marked for Dr. Towo.

Follow-up survey of the patient confirms presence of the radioactive
seed.

Order number of [id number] seed:  [id number].

Total activity:  0.251 millicuries reference Date: 10/30/2021

The patient tolerated the procedure well and was released from the
[REDACTED]. She was given instructions regarding seed removal.
IMPRESSION: Radioactive seed localization right breast. No apparent
complications.

## 2024-02-21 IMAGING — MG MM BREAST SURGICAL SPECIMEN
2 series · 4 of 4 positions shown · non-contrast
Comparison: Previous exam(s).

CLINICAL DATA: Status post lumpectomy today after earlier
radioactive seed localization.

EXAM:
SPECIMEN RADIOGRAPH OF THE RIGHT BREAST

[Series 2: R · right · 0.07mm/px · 2 of 2 slices shown (1 of 2)]
[im 1/2]
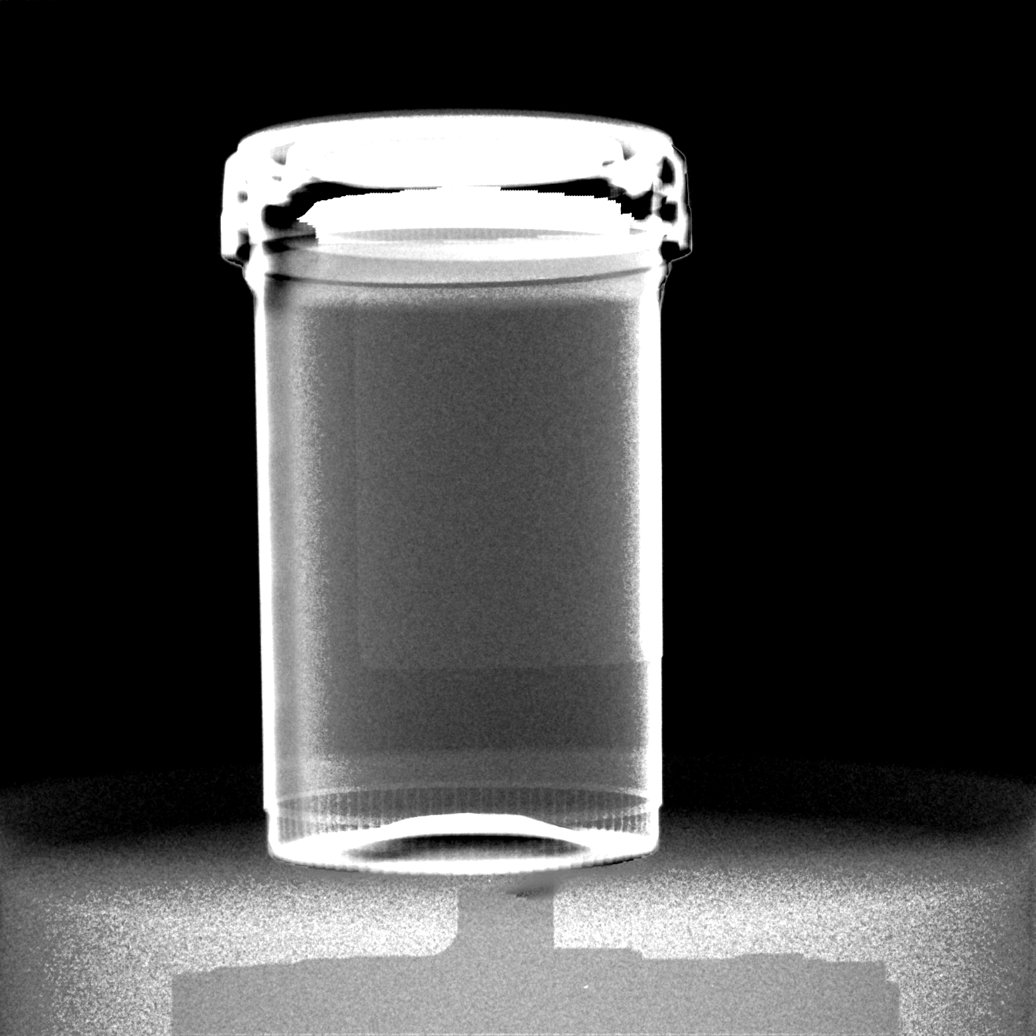
[im 2/2]
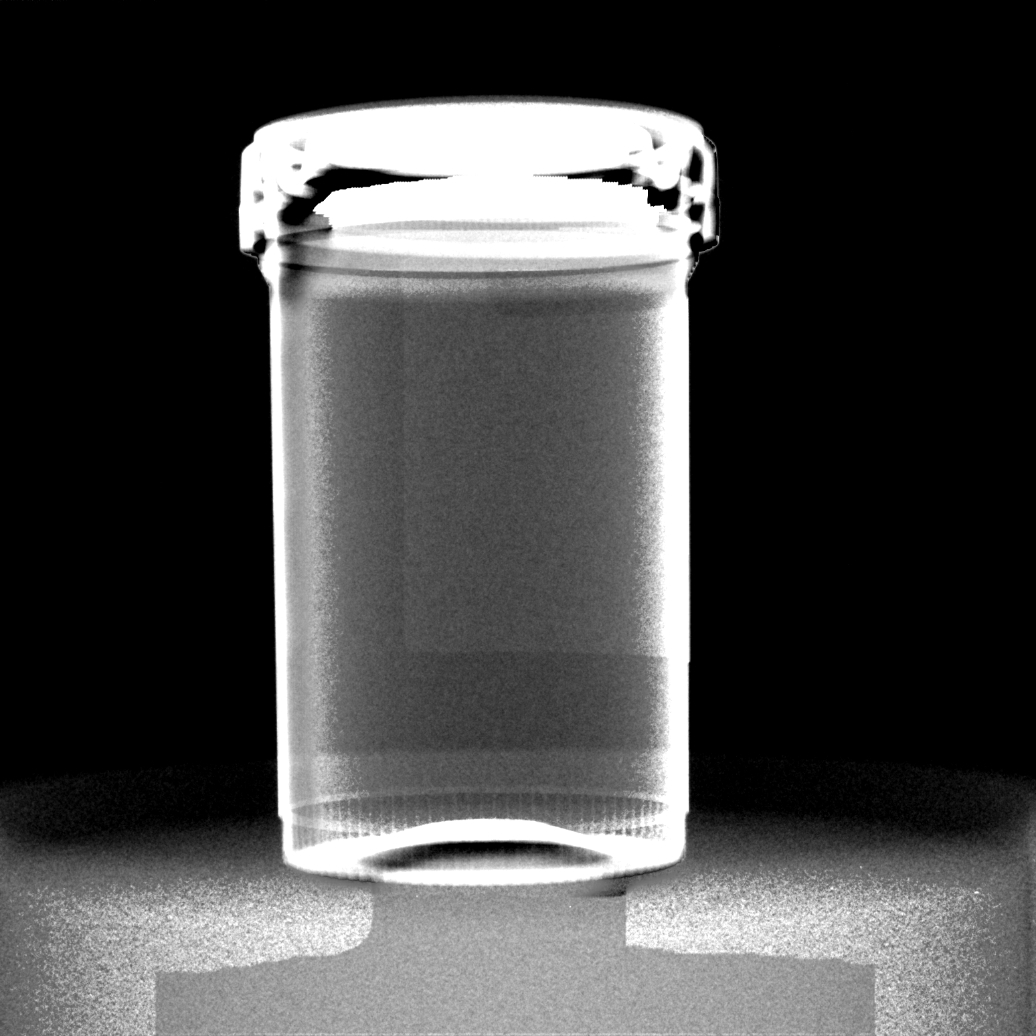

[Series 3: R · right · 0.07mm/px · 2 of 2 slices shown (2 of 2)]
[im 1/2]
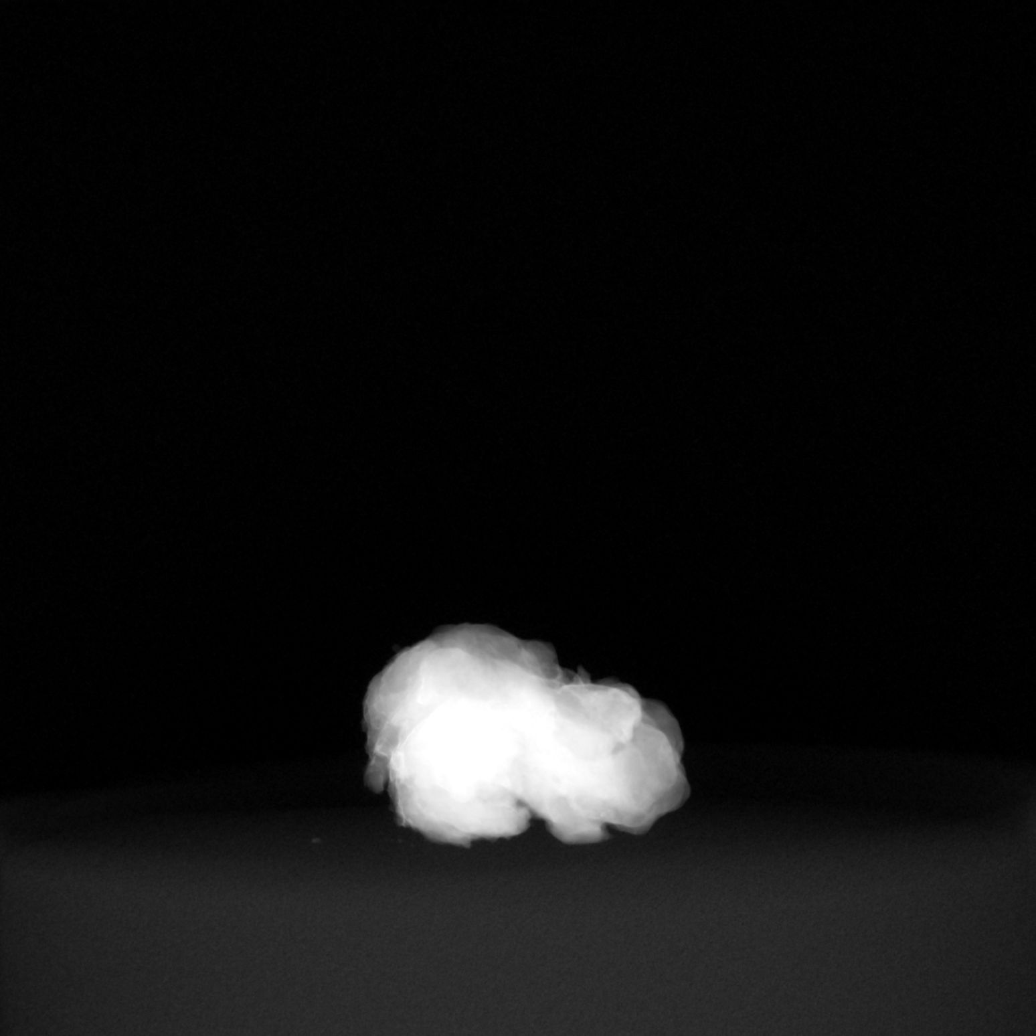
[im 2/2]
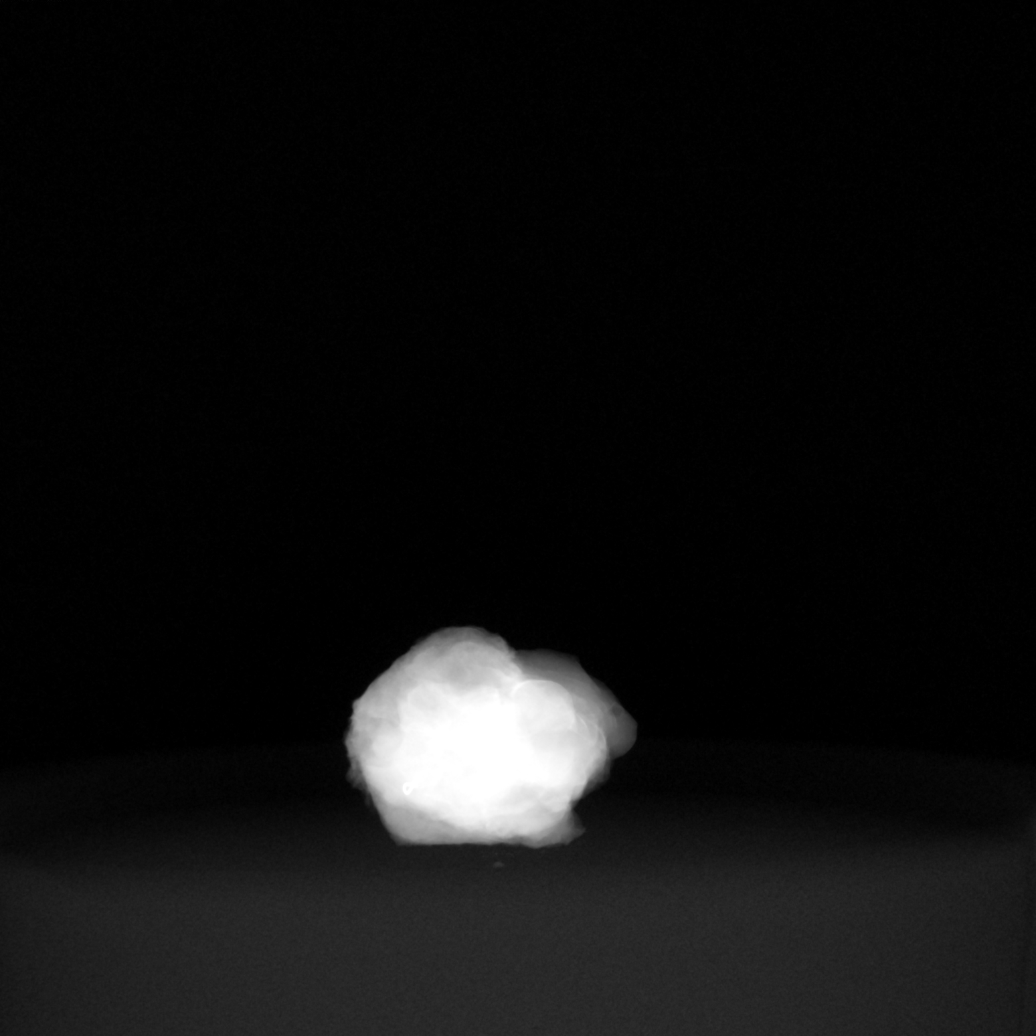

[4 of 4 positions shown; findings below may reference images not displayed]

FINDINGS: Status post excision of the right breast. The biopsy marker clip is
present and appears intact within the specimen. The radioactive seed
is seen within a separate container. Findings discussed with the OR
staff during the procedure.
IMPRESSION: Specimen radiograph of the right breast.

## 2024-03-01 ENCOUNTER — Ambulatory Visit: Payer: Self-pay | Admitting: Family Medicine

## 2024-03-01 NOTE — Progress Notes (Signed)
 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  Recommend return to clinic to go over the results in full detail and talk about potential injections.

## 2024-03-02 NOTE — Therapy (Deleted)
 OUTPATIENT PHYSICAL THERAPY THORACOLUMBAR EVALUATION   Patient Name: Carly Miller MRN: [id number] DOB:Jan 27, 1976, 48 y.o., female Today's Date: 03/02/2024  END OF SESSION:   Past Medical History:  Diagnosis Date   Breast cancer (HCC)    Endometriosis    Hematuria    History of radiation therapy    Right Breast- 12/29/21-02/12/22- Dr. Retta Caster   Hyperlipidemia    Mass of ovary    RIGHT   Pelvic pain in female    Personal history of radiation therapy    Urgency of urination    UTI (lower urinary tract infection)    Past Surgical History:  Procedure Laterality Date   ABDOMINAL HYSTERECTOMY N/A 08/31/2013   Procedure: HYSTERECTOMY ABDOMINAL, LYSIS OF ADHESIONS;  Surgeon: Denette Finner, MD;  Location: [facility];  Service: Gynecology;  Laterality: N/A;   ABSCESS DRAINAGE  09/22/1999   RECTAL   ANAL SPHINCTEROTOMY  09/21/1998   BREAST BIOPSY Right 10/27/2021   BREAST LUMPECTOMY WITH RADIOACTIVE SEED AND SENTINEL LYMPH NODE BIOPSY Right 11/25/2021   Procedure: RIGHT BREAST LUMPECTOMY WITH RADIOACTIVE SEED AND SENTINEL LYMPH NODE BIOPSY;  Surgeon: Sim Dryer, MD;  Location: [facility];  Service: General;  Laterality: Right;  90 MINUTES ROOM 8   DIAGNOSTIC LAPAROSCOPY LEFT OOPHORECTOMY  08/31/2007   ENDOMETRIOMA   LAPAROSCOPIC CHOLECYSTECTOMY  06/21/2012   LAPAROSCOPY N/A 08/31/2013   Procedure: LAPAROSCOPY DIAGNOSTIC;  Surgeon: Denette Finner, MD;  Location: [facility];  Service: Gynecology;  Laterality: N/A;   Patient Active Problem List   Diagnosis Date Noted   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 12/22/2021   Genetic testing 11/17/2021   Malignant neoplasm of upper-outer quadrant of right breast in female, estrogen receptor positive (HCC) 10/31/2021   S/P TAH (total abdominal hysterectomy) 08/31/2013   TOA (tubo-ovarian abscess) 08/31/2013   Endometrioma of ovary 08/31/2013   Pelvic adhesions 08/31/2013   S/P removal of  right ovary 08/31/2013    PCP: Belle Box, MD  REFERRING PROVIDER: Garlan Juniper, MD  REFERRING DIAG:  [phone] (ICD-10-CM) - Chronic right-sided low back pain with right-sided sciatica  M25.551,G89.29 (ICD-10-CM) - Chronic right hip pain    Rationale for Evaluation and Treatment: Rehabilitation  THERAPY DIAG:  No diagnosis found.  ONSET DATE: ***  SUBJECTIVE:                                                                                                                                                                                           SUBJECTIVE STATEMENT: *** PT earlier this year with DN for 4 visits   I went back to my old exercises  when the problem first started coming back. The old exercises really helped a lot at first, then the pain started creeping down my leg over the past couple months but about 2 weeks ago it was really severe, shooting/sharp/severe and waking me up at night. Dr. Alease Hunter thought it was my back, put me on a nerve medicine (gabpentin per chart) which helped tremendously. Did xray but no MRI yet. PT worked very well last time so wanted to try it again.  For a couple of months if I walked or ran the pain got worse, so haven't been doing really anything recently due to that. Hard to say if there is really a pattern to the pain at this point, if I do anything for too long it flares up. No known MOI or unusual motions around th e time this flared up.     PERTINENT HISTORY:  ***  PAIN:  Are you having pain? Yes: NPRS scale: *** Pain location: *** Pain description: *** Aggravating factors: *** Relieving factors: ***  PRECAUTIONS: {Therapy precautions:24002}  RED FLAGS: {PT Red Flags:29287}   WEIGHT BEARING RESTRICTIONS: {Yes ***/No:24003}  FALLS:  Has patient fallen in last 6 months? 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  DIAGNOSTIC FINDINGS:  Lumbar MRI 02/16/24 FINDINGS: There is normal alignment of the lumbar spine. There is no vertebral body height loss, subluxation or marrow replacing process. The sacrum and SI joints are unremarkable so far as visualized. Conus and cauda equina are unremarkable.   T12-L1: There is no focal disc protrusion, foraminal or spinal stenosis.   L1-2: There is no focal disc protrusion, foraminal or spinal stenosis.   L2-3: There is no focal disc protrusion, foraminal or spinal stenosis.   L3-4: There is no focal disc protrusion, foraminal or spinal stenosis. Mild facet arthrosis.   L4-5: There is no focal disc protrusion, foraminal or spinal stenosis. Mild facet arthrosis.   L5-S1: There is no focal disc protrusion, foraminal or spinal stenosis. Mild-to-moderate facet arthrosis.   The retroperitoneal structures demonstrate no significant abnormality.   IMPRESSION: Mild facet arthrosis. No focal protrusion, foraminal or spinal stenosis. No acute abnormality.  PATIENT SURVEYS:  Patient-specific activity functional scoring scheme (Point to one number):  0 represents "unable to perform." 10 represents "able to perform at prior level. 0 1 2 3 4 5 6 7 8 9  10 (Date and Score) Activity Initial  Activity Eval                      Additional Additional Total score = sum of the activity scores/number of activities Minimum detectable change (90%CI) for average score = 2 points Minimum detectable change (90%CI) for single activity score = 3 points PSFS developed by: [place] Spitz., & Binkley, J. (1995). Assessing disability and change on individual  patients: a report of a patient specific measure. Physiotherapy [place], 47, [phone]. Reproduced with the permission of  the authors  Score: ***   COGNITION: Overall cognitive status: Within functional limits for tasks assessed     SENSATION: {sensation:27233}  MUSCLE LENGTH: Hamstrings: Right *** deg; Left *** deg Andy Bannister test: Right *** deg; Left *** deg  POSTURE: {posture:25561}  PALPATION: ***  LUMBAR ROM:  AROM eval  Flexion   Extension   Right lateral flexion   Left lateral flexion   Right rotation   Left rotation    (Blank rows = not tested)   LE Measurements Lower Extremity Right EVAL Left EVAL   A/PROM MMT A/PROM MMT  Hip Flexion      Hip Extension      Hip Abduction      Hip Adduction      Hip Internal rotation      Hip External rotation      Knee Flexion      Knee Extension      Ankle Dorsiflexion      Ankle Plantarflexion      Ankle Inversion      Ankle Eversion       (Blank rows = not tested) * pain   LUMBAR SPECIAL TESTS:  {lumbar special test:25242}  FUNCTIONAL TESTS:  {Functional tests:24029}  GAIT: Distance walked: *** Assistive device utilized: {Assistive devices:23999} Level of assistance: {Levels of assistance:24026} Comments: ***  TREATMENT DATE: ***                                                                                                                              03/02/2024  Therapeutic Exercise:  Aerobic: Supine: Prone:  Seated:  Standing: Neuromuscular Re-education: Manual Therapy: Therapeutic Activity: Self Care: Trigger Point Dry Needling:  Modalities:     PATIENT EDUCATION:  Education details: on current presentation, on HEP, on clinical outcomes score and POC Person educated: Patient Education method: Explanation, Demonstration, and Handouts Education comprehension: verbalized understanding   HOME EXERCISE PROGRAM: ***  ASSESSMENT:  CLINICAL IMPRESSION: Patient is a *** y.o. *** who was seen today for physical therapy evaluation and treatment for ***.   OBJECTIVE IMPAIRMENTS: {opptimpairments:25111}.    ACTIVITY LIMITATIONS: {activitylimitations:27494}  PARTICIPATION LIMITATIONS: {participationrestrictions:25113}  PERSONAL FACTORS: {Personal factors:25162} are also affecting patient's functional outcome.   REHAB POTENTIAL: {rehabpotential:25112}  CLINICAL DECISION MAKING: {clinical decision making:25114}  EVALUATION COMPLEXITY: {Evaluation complexity:25115}   GOALS: Goals reviewed with patient? yes  SHORT TERM GOALS: Target date: {follow up:25551} *** MAKE TEXT EDITABLE Patient will be independent in self management strategies to improve quality of life and functional outcomes. Baseline: New Program Goal status: INITIAL  2.  Patient will report at least 50% improvement in overall symptoms and/or function to demonstrate improved functional mobility Baseline: 0% better Goal status: INITIAL  3.  *** Baseline:  Goal status: INITIAL  4.  *** Baseline:  Goal status: INITIAL    LONG TERM GOALS: Target date: {follow up:25551} *** MAKE TEXT EDITABLE  Patient will report at least 75% improvement in overall symptoms and/or function to demonstrate improved functional mobility Baseline: 0% better Goal status: INITIAL  2.  Patient will score at least 2 points higher on PSFS average to demonstrate change in overall function. Baseline: see above Goal status: INITIAL  3.  *** Baseline:  Goal status: INITIAL  4.  ***  Baseline:  Goal status: INITIAL   PLAN:  PT FREQUENCY: {rehab frequency:25116}  PT DURATION: {rehab duration:25117}  PLANNED INTERVENTIONS: 97110-Therapeutic exercises, 97530- Therapeutic activity, [id number]- Neuromuscular re-education, 97535- Self Care, 29562- Manual therapy, [phone]- Gait training, [phone]- Orthotic Fit/training, [phone]- Canalith repositioning, [id number]- Aquatic Therapy, 97014- Electrical stimulation (unattended), [phone]- Ionotophoresis 4mg /ml Dexamethasone , Patient/Family education, Balance training, Stair training, Taping, Dry Needling, Joint  mobilization, Joint manipulation, Spinal manipulation, Spinal mobilization, Cryotherapy, and Moist heat   PLAN FOR NEXT SESSION: ***wants dry needling  2:15 PM, 03/02/24 Tabitha Ewings, DPT Physical Therapy with [facility]

## 2024-03-03 ENCOUNTER — Ambulatory Visit: Admitting: Family Medicine

## 2024-03-03 ENCOUNTER — Other Ambulatory Visit: Payer: Self-pay

## 2024-03-03 VITALS — BP 106/74 | HR 69 | Ht 66.0 in | Wt 181.0 lb

## 2024-03-03 DIAGNOSIS — M545 Low back pain, unspecified: Secondary | ICD-10-CM

## 2024-03-03 DIAGNOSIS — G5711 Meralgia paresthetica, right lower limb: Secondary | ICD-10-CM | POA: Insufficient documentation

## 2024-03-03 DIAGNOSIS — G8929 Other chronic pain: Secondary | ICD-10-CM

## 2024-03-03 NOTE — Progress Notes (Signed)
 Joanna Muck, PhD, LAT, ATC acting as a scribe for Garlan Juniper, MD.  Carly Miller is a 48 y.o. female who presents to [facility] today for f/u low back and R hip pain w/ MRI review. Pt was last seen by Dr. Alease Hunter on 11/25/23 at which point her pain had mostly resolved. Pt exchanged MyChart message mid-May c/o returning pain. PT was re-ordered and later MRI ordered.  Today, pt reports she is having radicular pain into her R leg. Pain is located along the lateral aspect of her R thigh. +n/t and burning pain.   Dx imaging: 02/16/24 L-spine MRI 09/30/23 L-spine XR 04/07/22 L-spine & R hip XR  Pertinent review of systems: No fevers or chills  Relevant historical information: Right breast cancer on tamoxifen .  She has had some weight gain.   Exam:  BP 106/74   Pulse 69   Ht 5' 6 (1.676 m)   Wt 181 lb (82.1 kg)   LMP 08/10/2013   SpO2 99%   BMI 29.21 kg/m  General: Well Developed, well nourished, and in no acute distress.   MSK: Right hip normal-appearing normal motion.  Not particularly tender to palpation.    Lab and Radiology Results  Procedure: Real-time Ultrasound Guided Injection of right lateral femoral cutaneous nerve Device: Philips Affiniti 50G/GE Logiq Images permanently stored and available for review in PACS Verbal informed consent obtained.  Discussed risks and benefits of procedure. Warned about infection, bleeding, hyperglycemia damage to structures among others. Patient expresses understanding and agreement Time-out conducted.   Noted no overlying erythema, induration, or other signs of local infection.   Skin prepped in a sterile fashion.   Local anesthesia: Topical Ethyl chloride.   With sterile technique and under real time ultrasound guidance: 40 mg of Kenalog  and 2 mL of Marcaine  injected into soft tissue around the right lateral femoral cutaneous nerve. Fluid seen entering the soft tissue around the nerve.   Completed  without difficulty   Pain immediately resolved suggesting accurate placement of the medication.   Advised to call if fevers/chills, erythema, induration, drainage, or persistent bleeding.   Images permanently stored and available for review in the ultrasound unit.  Impression: Technically successful ultrasound guided injection.   MR LUMBAR SPINE WITHOUT IV CONTRAST   COMPARISON: None available   CLINICAL HISTORY: Lumbar radiculopathy   TECHNIQUE: SAG T2, SAG T1, SAG STIR, AX T2, AX T1 without IV contrast.   FINDINGS: There is normal alignment of the lumbar spine. There is no vertebral body height loss, subluxation or marrow replacing process. The sacrum and SI joints are unremarkable so far as visualized. Conus and cauda equina are unremarkable.   T12-L1: There is no focal disc protrusion, foraminal or spinal stenosis.   L1-2: There is no focal disc protrusion, foraminal or spinal stenosis.   L2-3: There is no focal disc protrusion, foraminal or spinal stenosis.   L3-4: There is no focal disc protrusion, foraminal or spinal stenosis. Mild facet arthrosis.   L4-5: There is no focal disc protrusion, foraminal or spinal stenosis. Mild facet arthrosis.   L5-S1: There is no focal disc protrusion, foraminal or spinal stenosis. Mild-to-moderate facet arthrosis.   The retroperitoneal structures demonstrate no significant abnormality.   IMPRESSION: Mild facet arthrosis. No focal protrusion, foraminal or spinal stenosis. No acute abnormality.   Electronically signed by: Adrien Alberta MD 02/29/2024 02:12 PM EDT RP Workstation: [id number]  Dione Franks, personally (independently) visualized and performed the interpretation  of the images attached in this note.       Assessment and Plan: 48 y.o. female with chronic back pain and right thigh paresthesia.  Chronic back pain due to degenerative changes and muscle dysfunction.  Significant improvement with physical therapy.   Plan to continue home exercise program.  Right thigh paresthesia: No evidence of lumbar radiculopathy based on the lumbar spine MRI.  Symptoms consistent with meralgia paresthetica.  After discussion plan for trial of lateral femoral cutaneous nerve injection and continued gabapentin .  We discussed weight loss strategies.  Recheck as needed.   PDMP not reviewed this encounter. Orders Placed This Encounter  Procedures   US  LIMITED JOINT SPACE STRUCTURES LOW RIGHT(NO LINKED CHARGES)    Reason for Exam (SYMPTOM  OR DIAGNOSIS REQUIRED):   right thigh pain    Preferred imaging location?:   [facility]   No orders of the defined types were placed in this encounter.    Discussed warning signs or symptoms. Please see discharge instructions. Patient expresses understanding.   The above documentation has been reviewed and is accurate and complete Garlan Juniper, M.D.

## 2024-03-03 NOTE — Patient Instructions (Addendum)
 Thank you for coming in today.   You received an injection today. Seek immediate medical attention if the joint becomes red, extremely painful, or is oozing fluid.   Check back as needed

## 2024-03-07 ENCOUNTER — Ambulatory Visit: Admitting: Physical Therapy

## 2024-03-22 ENCOUNTER — Other Ambulatory Visit: Payer: Self-pay | Admitting: Family Medicine

## 2024-03-22 DIAGNOSIS — G8929 Other chronic pain: Secondary | ICD-10-CM

## 2024-03-22 NOTE — Telephone Encounter (Signed)
 Last OV 03/03/24 Next OV bit scheduled  Last refill 09/30/23 Trina #90/2

## 2024-04-18 ENCOUNTER — Encounter: Payer: Self-pay | Admitting: Hematology and Oncology

## 2024-06-12 ENCOUNTER — Ambulatory Visit: Attending: Surgery

## 2024-06-12 VITALS — Wt 179.5 lb

## 2024-06-12 DIAGNOSIS — Z483 Aftercare following surgery for neoplasm: Secondary | ICD-10-CM | POA: Insufficient documentation

## 2024-06-12 NOTE — Therapy (Signed)
 OUTPATIENT PHYSICAL THERAPY SOZO SCREENING NOTE   Patient Name: Carly Miller MRN: [id number] DOB:1976-05-22, 48 y.o., female Today's Date: 06/12/2024  PCP: Marget Lenis, MD REFERRING PROVIDER: Vanderbilt Ned, MD   PT End of Session - 06/12/24 0825     Visit Number 4   # unchanged due to screen only   PT Start Time 0823    PT Stop Time 0827    PT Time Calculation (min) 4 min    Activity Tolerance Patient tolerated treatment well    Behavior During Therapy [facility] for tasks assessed/performed          Past Medical History:  Diagnosis Date   Breast cancer (HCC)    Endometriosis    Hematuria    History of radiation therapy    Right Breast- 12/29/21-02/12/22- Dr. Lynwood Nasuti   Hyperlipidemia    Mass of ovary    RIGHT   Pelvic pain in female    Personal history of radiation therapy    Urgency of urination    UTI (lower urinary tract infection)    Past Surgical History:  Procedure Laterality Date   ABDOMINAL HYSTERECTOMY N/A 08/31/2013   Procedure: HYSTERECTOMY ABDOMINAL, LYSIS OF ADHESIONS;  Surgeon: Lenis JAYSON Marget, MD;  Location: [facility];  Service: Gynecology;  Laterality: N/A;   ABSCESS DRAINAGE  09/22/1999   RECTAL   ANAL SPHINCTEROTOMY  09/21/1998   BREAST BIOPSY Right 10/27/2021   BREAST LUMPECTOMY WITH RADIOACTIVE SEED AND SENTINEL LYMPH NODE BIOPSY Right 11/25/2021   Procedure: RIGHT BREAST LUMPECTOMY WITH RADIOACTIVE SEED AND SENTINEL LYMPH NODE BIOPSY;  Surgeon: Vanderbilt Ned, MD;  Location: [facility];  Service: General;  Laterality: Right;  90 MINUTES ROOM 8   DIAGNOSTIC LAPAROSCOPY LEFT OOPHORECTOMY  08/31/2007   ENDOMETRIOMA   LAPAROSCOPIC CHOLECYSTECTOMY  06/21/2012   LAPAROSCOPY N/A 08/31/2013   Procedure: LAPAROSCOPY DIAGNOSTIC;  Surgeon: Lenis JAYSON Marget, MD;  Location: [facility];  Service: Gynecology;  Laterality: N/A;   Patient Active Problem List   Diagnosis Date Noted   Meralgia paresthetica, right  03/03/2024   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 12/22/2021   Genetic testing 11/17/2021   Malignant neoplasm of upper-outer quadrant of right breast in female, estrogen receptor positive (HCC) 10/31/2021   S/P TAH (total abdominal hysterectomy) 08/31/2013   TOA (tubo-ovarian abscess) 08/31/2013   Endometrioma of ovary 08/31/2013   Pelvic adhesions 08/31/2013   S/P removal of right ovary 08/31/2013    REFERRING DIAG: right breast cancer at risk for lymphedema  THERAPY DIAG:  Aftercare following surgery for neoplasm  PERTINENT HISTORY: Patient was diagnosed on 10/10/2021 with 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 She underwent a right lumpectomy and sentinel node biopsy (4 negative nodes) on 11/25/2021. It is ER/PR positive and HER2 negative with a Ki67 of < 5%. She had breast implants placed in 2016.   PRECAUTIONS: right UE Lymphedema risk, None  SUBJECTIVE: Pt returns for her first 6 month L-Dex screen.   PAIN:  Are you having pain? No  SOZO SCREENING: Patient was assessed today using the SOZO machine to determine the lymphedema index score. This was compared to her baseline score. It was determined that she is within the recommended range when compared to her baseline and no further action is needed at this time. She will continue SOZO screenings. These are done every 3 months for 2 years post operatively followed by every 6 months for 2 years, and then annually.   L-DEX FLOWSHEETS -  06/12/24 0800       L-DEX LYMPHEDEMA SCREENING   Measurement Type Unilateral    L-DEX MEASUREMENT EXTREMITY Upper Extremity    POSITION  Standing    DOMINANT SIDE Right    At Risk Side Right    BASELINE SCORE (UNILATERAL) 1.7    L-DEX SCORE (UNILATERAL) -3.5    VALUE CHANGE (UNILAT) -5.2         P: Cont every 6 month L-Dex screens until 4 years from breast surgery.   Aden Berwyn Caldron, PTA 06/12/2024, 8:25 AM

## 2024-07-26 ENCOUNTER — Telehealth: Payer: Self-pay

## 2024-07-26 NOTE — Telephone Encounter (Signed)
 Left message on voicemail for patient about upcoming appointment on 11/6

## 2024-07-27 ENCOUNTER — Inpatient Hospital Stay: Attending: Hematology and Oncology | Admitting: Hematology and Oncology

## 2024-07-27 VITALS — BP 119/78 | HR 86 | Temp 97.2°F | Resp 16 | Wt 171.0 lb

## 2024-07-27 DIAGNOSIS — C50411 Malignant neoplasm of upper-outer quadrant of right female breast: Secondary | ICD-10-CM | POA: Diagnosis not present

## 2024-07-27 DIAGNOSIS — Z923 Personal history of irradiation: Secondary | ICD-10-CM | POA: Insufficient documentation

## 2024-07-27 DIAGNOSIS — M25559 Pain in unspecified hip: Secondary | ICD-10-CM | POA: Insufficient documentation

## 2024-07-27 DIAGNOSIS — Z87440 Personal history of urinary (tract) infections: Secondary | ICD-10-CM | POA: Insufficient documentation

## 2024-07-27 DIAGNOSIS — Z170 Estrogen receptor positive status [ER+]: Secondary | ICD-10-CM | POA: Diagnosis not present

## 2024-07-27 DIAGNOSIS — Z79810 Long term (current) use of selective estrogen receptor modulators (SERMs): Secondary | ICD-10-CM | POA: Insufficient documentation

## 2024-07-27 DIAGNOSIS — Z9079 Acquired absence of other genital organ(s): Secondary | ICD-10-CM | POA: Insufficient documentation

## 2024-07-27 DIAGNOSIS — Z90710 Acquired absence of both cervix and uterus: Secondary | ICD-10-CM | POA: Insufficient documentation

## 2024-07-27 DIAGNOSIS — Z90722 Acquired absence of ovaries, bilateral: Secondary | ICD-10-CM | POA: Insufficient documentation

## 2024-07-27 DIAGNOSIS — R232 Flushing: Secondary | ICD-10-CM | POA: Insufficient documentation

## 2024-07-27 NOTE — Progress Notes (Signed)
 [facility] Cancer Follow up:    Carly Lenis, MD [address] [place] [address]   DIAGNOSIS:  Cancer Staging  Malignant neoplasm of upper-outer quadrant of right breast in female, estrogen receptor positive (HCC) Staging form: Breast, AJCC 8th Edition - Clinical: Stage IA (cT1c, cN0, cM0, G2, ER+, PR+, HER2-) - Signed by Loretha Ash, MD on 11/05/2021 Stage prefix: Initial diagnosis Histologic grading system: 3 grade system   SUMMARY OF ONCOLOGIC HISTORY: Oncology History  Malignant neoplasm of upper-outer quadrant of right breast in female, estrogen receptor positive (HCC)  10/10/2021 Mammogram   Indeterminate right breast mass at the 11 o'clock position corresponding to the patient, recommend ultrasound-guided biopsy of the, no suspicious right axillary lymphadenopathy.   Targeted ultrasound is performed, showing an irregular, hypoechoic mass with associated vascularity at the 11 o'clock position 2 cm from the nipple. It measures 1.9 x 1.6 x 1.0 cm. This corresponds with the patient's palpable lump. Evaluation of the right axilla demonstrates no suspicious lymphadenopathy.   10/31/2021 Initial Diagnosis   Malignant neoplasm of upper-outer quadrant of right breast in female, estrogen receptor positive (HCC)   11/05/2021 Cancer Staging   Staging form: Breast, AJCC 8th Edition - Clinical: Stage IA (cT1c, cN0, cM0, G2, ER+, PR+, HER2-) - Signed by Loretha Ash, MD on 11/05/2021 Stage prefix: Initial diagnosis Histologic grading system: 3 grade system    Genetic Testing   [facility] CancerNext-Expanded Panel was Negative. Report date is 11/17/2021.  The CancerNext-Expanded gene panel offered by [facility] and includes sequencing, rearrangement, and RNA analysis for the following 77 genes: AIP, ALK, APC, ATM, AXIN2, BAP1, BARD1, BLM, BMPR1A, BRCA1, BRCA2, BRIP1, CDC73, CDH1, CDK4, CDKN1B, CDKN2A, CHEK2, CTNNA1, DICER1, FANCC, FH, FLCN, GALNT12,  KIF1B, LZTR1, MAX, MEN1, MET, MLH1, MSH2, MSH3, MSH6, MUTYH, NBN, NF1, NF2, NTHL1, PALB2, PHOX2B, PMS2, POT1, PRKAR1A, PTCH1, PTEN, RAD51C, RAD51D, RB1, RECQL, RET, SDHA, SDHAF2, SDHB, SDHC, SDHD, SMAD4, SMARCA4, SMARCB1, SMARCE1, STK11, SUFU, TMEM127, TP53, TSC1, TSC2, VHL and XRCC2 (sequencing and deletion/duplication); EGFR, EGLN1, HOXB13, KIT, MITF, PDGFRA, POLD1, and POLE (sequencing only); EPCAM and GREM1 (deletion/duplication only).     Pathology Results    Biopsy of the area showed IDC, grade 1-2, ER positive 100% moderate staining, PR positive 100% strong staining, Her 2 neg. Ki 5%.   11/25/2021 Surgery   Right breast lumpectomy showed invasive ductal carcinoma, 2 cm, grade 1, intermediate grade DCIS, 4 out of 4 sentinel lymph nodes without any micrometastasis, prior prognostics showed ER 100% positive strong staining, PR 100% positive strong staining, HER2 negative, Ki-67 less than 5%   11/25/2021 Oncotype testing   17/5%   12/29/2021 - 02/12/2022 Radiation Therapy   12/29/2021 through 02/12/2022 Site Technique Total Dose (Gy) Dose per Fx (Gy) Completed Fx Beam Energies  Breast, Right: Breast_R 3D 50.4/50.4 1.8 28/28 6X  Breast, Right: Breast_R_Bst specialPort 10/10 2 5/5 6E     01/2022 -  Anti-estrogen oral therapy   Tamoxifen  daily     CURRENT THERAPY: Tamoxifen   INTERVAL HISTORY:  Carly Miller 48 y.o. female returns for f/u  History of Present Illness  Carly Miller is here for a follow up. Since her last visit, she is on tamoxifen  10 mg po daily. She is tolerating this dose very well. She started taking gabapentin  for some hip pain which happened to help her hot flashes. She denies any changes in her breast She hasn't been exercising daily but plans to work on it. Rest of the  pertinent 10 point ROS reviewed and neg.  Patient Active Problem List   Diagnosis Date Noted   Meralgia paresthetica, right 03/03/2024   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 12/22/2021   Genetic testing  11/17/2021   Malignant neoplasm of upper-outer quadrant of right breast in female, estrogen receptor positive (HCC) 10/31/2021   S/P TAH (total abdominal hysterectomy) 08/31/2013   TOA (tubo-ovarian abscess) 08/31/2013   Endometrioma of ovary 08/31/2013   Pelvic adhesions 08/31/2013   S/P removal of right ovary 08/31/2013    is allergic to naproxen sodium and naproxen.  MEDICAL HISTORY: Past Medical History:  Diagnosis Date   Breast cancer (HCC)    Endometriosis    Hematuria    History of radiation therapy    Right Breast- 12/29/21-02/12/22- Dr. Lynwood Nasuti   Hyperlipidemia    Mass of ovary    RIGHT   Pelvic pain in female    Personal history of radiation therapy    Urgency of urination    UTI (lower urinary tract infection)     SURGICAL HISTORY: Past Surgical History:  Procedure Laterality Date   ABDOMINAL HYSTERECTOMY N/A 08/31/2013   Procedure: HYSTERECTOMY ABDOMINAL, LYSIS OF ADHESIONS;  Surgeon: Alm JAYSON Cook, MD;  Location: [facility];  Service: Gynecology;  Laterality: N/A;   ABSCESS DRAINAGE  09/22/1999   RECTAL   ANAL SPHINCTEROTOMY  09/21/1998   BREAST BIOPSY Right 10/27/2021   BREAST LUMPECTOMY WITH RADIOACTIVE SEED AND SENTINEL LYMPH NODE BIOPSY Right 11/25/2021   Procedure: RIGHT BREAST LUMPECTOMY WITH RADIOACTIVE SEED AND SENTINEL LYMPH NODE BIOPSY;  Surgeon: Vanderbilt Ned, MD;  Location: [facility];  Service: General;  Laterality: Right;  90 MINUTES ROOM 8   DIAGNOSTIC LAPAROSCOPY LEFT OOPHORECTOMY  08/31/2007   ENDOMETRIOMA   LAPAROSCOPIC CHOLECYSTECTOMY  06/21/2012   LAPAROSCOPY N/A 08/31/2013   Procedure: LAPAROSCOPY DIAGNOSTIC;  Surgeon: Alm JAYSON Cook, MD;  Location[facility];  Service: Gynecology;  Laterality: N/A;    SOCIAL HISTORY: Social History   Socioeconomic History   Marital status: Married    Spouse name: Not on file   Number of children: Not on file   Years of education: Not on file    Highest education level: Not on file  Occupational History   Not on file  Tobacco Use   Smoking status: Never   Smokeless tobacco: Never  Vaping Use   Vaping status: Never Used  Substance and Sexual Activity   Alcohol use: No   Drug use: No   Sexual activity: Not on file  Other Topics Concern   Not on file  Social History Narrative   Not on file   Social Drivers of Health   Financial Resource Strain: Low Risk  (11/05/2021)   Overall Financial Resource Strain (CARDIA)    Difficulty of Paying Living Expenses: Not hard at all  Food Insecurity: No Food Insecurity (11/05/2021)   Hunger Vital Sign    Worried About Running Out of Food in the Last Year: Never true    Ran Out of Food in the Last Year: Never true  Transportation Needs: No Transportation Needs (11/05/2021)   PRAPARE - Administrator, Civil Service (Medical): No    Lack of Transportation (Non-Medical): No  Physical Activity: Not on file  Stress: Not on file  Social Connections: Unknown (01/19/2022)   Received [facility]   Social Network    Social Network: Not on file  Intimate Partner Violence: Unknown (12/24/2021)   Received  from [facility]   HITS    Physically Hurt: Not on file    Insult or Talk Down To: Not on file    Threaten Physical Harm: Not on file    Scream or Curse: Not on file    FAMILY HISTORY: Family History  Problem Relation Age of Onset   Asthma Mother    Thyroid disease Mother    Asthma Father    Diabetes Father    Thyroid disease Father    Kidney disease Sister    Thyroid disease Maternal Aunt    Thyroid disease Maternal Uncle    Leukemia Maternal Grandmother        dx. 60s   Breast cancer Neg Hx    Colon cancer Neg Hx    Colon polyps Neg Hx    Esophageal cancer Neg Hx    Stomach cancer Neg Hx    Rectal cancer Neg Hx     Review of Systems  Constitutional:  Negative for appetite change, chills, fatigue, fever and unexpected weight change.  HENT:   Negative for  hearing loss, lump/mass and trouble swallowing.   Eyes:  Negative for eye problems and icterus.  Respiratory:  Negative for chest tightness, cough and shortness of breath.   Cardiovascular:  Negative for chest pain, leg swelling and palpitations.  Gastrointestinal:  Negative for abdominal distention, abdominal pain, constipation, diarrhea, nausea and vomiting.  Endocrine: Negative for hot flashes.  Genitourinary:  Negative for difficulty urinating.   Musculoskeletal:  Positive for myalgias. Negative for arthralgias.  Skin:  Positive for rash. Negative for itching.  Neurological:  Negative for dizziness, extremity weakness, headaches and numbness.  Hematological:  Negative for adenopathy. Does not bruise/bleed easily.  Psychiatric/Behavioral:  Negative for depression. The patient is not nervous/anxious.      PHYSICAL EXAMINATION     Vitals:   07/27/24 1151  BP: 119/78  Pulse: 86  Resp: 16  Temp: (!) 97.2 F (36.2 C)  SpO2: 96%    Physical Exam Constitutional:      General: She is not in acute distress.    Appearance: Normal appearance. She is not toxic-appearing.  HENT:     Head: Normocephalic and atraumatic.     Mouth/Throat:     Mouth: Mucous membranes are moist.     Pharynx: Oropharynx is clear. No oropharyngeal exudate or posterior oropharyngeal erythema.  Eyes:     General: No scleral icterus. Chest:     Comments: Bilateral breasts inspected and palpated.  Implants intact No palpable masses. No regional adenopathy Abdominal:     General: Abdomen is flat. Bowel sounds are normal. There is no distension.     Palpations: Abdomen is soft.     Tenderness: There is no abdominal tenderness.  Musculoskeletal:     Cervical back: Neck supple.  Skin:    General: Skin is warm and dry.  Neurological:     General: No focal deficit present.     Mental Status: She is alert.  Psychiatric:        Mood and Affect: Mood normal.        Behavior: Behavior normal.       ASSESSMENT and THERAPY PLAN:   Malignant neoplasm of upper-outer quadrant of right breast in female, estrogen receptor positive (HCC) 48 year old woman with history of stage IA right breast IDC ER/PR+ diagnosed in 10/2021 s/p lumpectomy, adjuvant radiation, and antiestrogen therapy with tamoxifen  that she began in 01/2022.     Assessment & Plan Invasive  breast cancer She is tolerating tamoxifen  10 mg daily very well No concern for recurrence on ROS or PE Mammogram annually.   Hot flashes Severe hot flashes improved with gabapentin . Continue gabapentin  as needed.   Total hysterectomy Considered postmenopausal. We can try AI's in the future if she has difficulty tolerating tamoxifen .  All questions were answered. The patient knows to call the clinic with any problems, questions or concerns. We can certainly see the patient much sooner if necessary.  Total encounter time:30 minutes*in face-to-face visit time, chart review, lab review, care coordination, order entry, and documentation of the encounter time.    *Total Encounter Time as defined by the Centers for Medicare and Medicaid Services includes, in addition to the face-to-face time of a patient visit (documented in the note above) non-face-to-face time: obtaining and reviewing outside history, ordering and reviewing medications, tests or procedures, care coordination (communications with other health care professionals or caregivers) and documentation in the medical record.

## 2024-07-27 NOTE — Assessment & Plan Note (Signed)
 48 year old woman with history of stage IA right breast IDC ER/PR+ diagnosed in 10/2021 s/p lumpectomy, adjuvant radiation, and antiestrogen therapy with tamoxifen  that she began in 01/2022.   Assessment & Plan Invasive breast cancer  Hot flashes Severe hot flashes managed with black cohosh. Discussed black label warning for hepatotoxicity. Gabapentin  may help but taken only at night. Consider prescription effexor if symptoms persist post-tamoxifen  dose reduction. - Monitor hot flashes post-tamoxifen  dose reduction.  Memory issues Ongoing memory issues possibly related to tamoxifen . Participated in study with some word recall improvement. Considered tamoxifen  dose reduction to assess impact. - Reduce tamoxifen  to 10 mg daily.  Numbness and leg pain Symptoms improved with gabapentin , taken at night due to drowsiness. Gabapentin  may also help with hot flashes.  Urinary tract infection Recent UTI with cramping and dark urine. On nitrofurantoin , awaiting culture results. Discussed importance of culture for antibiotic sensitivity. - Follow up on urine culture results. - Consider changing antibiotic if resistant to nitrofurantoin . - She will call her PCP  Total hysterectomy Considered postmenopausal. Discussed alternative medications to tamoxifen  if side effects intolerable despite dose reduction.

## 2024-08-02 ENCOUNTER — Ambulatory Visit
Admission: RE | Admit: 2024-08-02 | Discharge: 2024-08-02 | Disposition: A | Discharge status: Home / Self Care | Source: Ambulatory Visit | Attending: Hematology and Oncology | Admitting: Hematology and Oncology

## 2024-08-02 ENCOUNTER — Other Ambulatory Visit: Payer: Self-pay | Admitting: Hematology and Oncology

## 2024-08-02 DIAGNOSIS — Z170 Estrogen receptor positive status [ER+]: Secondary | ICD-10-CM

## 2024-08-04 ENCOUNTER — Other Ambulatory Visit: Payer: Self-pay | Admitting: Family Medicine

## 2024-08-04 DIAGNOSIS — G8929 Other chronic pain: Secondary | ICD-10-CM

## 2024-08-04 NOTE — Telephone Encounter (Signed)
 Last OV 03/03/24 Next OV not scheduled  Last refill 03/22/24 [facility] #[id number]

## 2024-08-31 ENCOUNTER — Emergency Department

## 2024-08-31 ENCOUNTER — Observation Stay
Admission: EM | Admit: 2024-08-31 | Discharge: 2024-09-02 | Disposition: A | Discharge status: Home / Self Care | Attending: Hospitalist | Admitting: Hospitalist

## 2024-08-31 ENCOUNTER — Other Ambulatory Visit: Payer: Self-pay

## 2024-08-31 DIAGNOSIS — F32A Depression, unspecified: Secondary | ICD-10-CM | POA: Diagnosis not present

## 2024-08-31 DIAGNOSIS — K921 Melena: Secondary | ICD-10-CM | POA: Insufficient documentation

## 2024-08-31 DIAGNOSIS — Z79899 Other long term (current) drug therapy: Secondary | ICD-10-CM | POA: Insufficient documentation

## 2024-08-31 DIAGNOSIS — G629 Polyneuropathy, unspecified: Secondary | ICD-10-CM | POA: Insufficient documentation

## 2024-08-31 DIAGNOSIS — K529 Noninfective gastroenteritis and colitis, unspecified: Secondary | ICD-10-CM | POA: Diagnosis not present

## 2024-08-31 DIAGNOSIS — Z853 Personal history of malignant neoplasm of breast: Secondary | ICD-10-CM | POA: Insufficient documentation

## 2024-08-31 DIAGNOSIS — K922 Gastrointestinal hemorrhage, unspecified: Principal | ICD-10-CM

## 2024-08-31 DIAGNOSIS — R109 Unspecified abdominal pain: Secondary | ICD-10-CM | POA: Diagnosis present

## 2024-08-31 DIAGNOSIS — R933 Abnormal findings on diagnostic imaging of other parts of digestive tract: Secondary | ICD-10-CM | POA: Insufficient documentation

## 2024-08-31 DIAGNOSIS — G8929 Other chronic pain: Secondary | ICD-10-CM

## 2024-08-31 DIAGNOSIS — K6389 Other specified diseases of intestine: Secondary | ICD-10-CM | POA: Insufficient documentation

## 2024-08-31 LAB — URINALYSIS, ROUTINE W REFLEX MICROSCOPIC
Bilirubin Urine: NEGATIVE
Glucose, UA: NEGATIVE mg/dL
Hgb urine dipstick: NEGATIVE
Ketones, ur: 20 mg/dL — AB
Nitrite: NEGATIVE
Protein, ur: NEGATIVE mg/dL
Specific Gravity, Urine: 1.025 (ref 1.005–1.030)
WBC, UA: >50 WBC/hpf (ref 0–5)
pH: 5 (ref 5.0–8.0)

## 2024-08-31 LAB — COMPREHENSIVE METABOLIC PANEL WITH GFR
ALT: 9 U/L (ref 0–44)
AST: 16 U/L (ref 15–41)
Albumin: 4.3 g/dL (ref 3.5–5.0)
Alkaline Phosphatase: 66 U/L (ref 38–126)
Anion gap: 12 (ref 5–15)
BUN: 13 mg/dL (ref 6–20)
CO2: 25 mmol/L (ref 22–32)
Calcium: 9.1 mg/dL (ref 8.9–10.3)
Chloride: 102 mmol/L (ref 98–111)
Creatinine, Ser: 0.73 mg/dL (ref 0.44–1.00)
GFR, Estimated: >60 mL/min (ref >60)
Glucose, Bld: 86 mg/dL (ref 70–99)
Potassium: 4.2 mmol/L (ref 3.5–5.1)
Sodium: 139 mmol/L (ref 135–145)
Total Bilirubin: 0.3 mg/dL (ref 0.0–1.2)
Total Protein: 6.8 g/dL (ref 6.5–8.1)

## 2024-08-31 LAB — CBC
HCT: 39.3 % (ref 36.0–46.0)
Hemoglobin: 13.2 g/dL (ref 12.0–15.0)
MCH: 30.3 pg (ref 26.0–34.0)
MCHC: 33.6 g/dL (ref 30.0–36.0)
MCV: 90.3 fL (ref 80.0–100.0)
Platelets: 334 K/uL (ref 150–400)
RBC: 4.35 MIL/uL (ref 3.87–5.11)
RDW: 12.2 % (ref 11.5–15.5)
WBC: 10.4 K/uL (ref 4.0–10.5)
nRBC: 0 % (ref 0.0–0.2)

## 2024-08-31 LAB — HEMOGLOBIN AND HEMATOCRIT, BLOOD
HCT: 40.2 % (ref 36.0–46.0)
Hemoglobin: 13.2 g/dL (ref 12.0–15.0)

## 2024-08-31 LAB — TYPE AND SCREEN
ABO/RH(D): O POS
Antibody Screen: NEGATIVE

## 2024-08-31 MED ORDER — IOHEXOL 350 MG/ML SOLN
100.0000 mL | Freq: Once | INTRAVENOUS | Status: AC | PRN
  Administered 2024-09-01: 100 mL via INTRAVENOUS

## 2024-08-31 NOTE — ED Triage Notes (Signed)
 Pt arrives via POV with c/o abdominal pain, diarrhea, and blood in their stool that started around lunch time and has had about 13 episodes of diarrhea. Pt states that this afternoon they started passing blot clots that were about the size of a large cherry. Pt is A&Ox4 during triage.

## 2024-08-31 NOTE — ED Notes (Signed)
 Pt stated that she missed a med that she is currently taking. The medication is zepbound injection and she is taking 7.5 milligrams.

## 2024-08-31 NOTE — ED Provider Notes (Signed)
 [facility] Provider Note    Event Date/Time   First MD Initiated Contact with Patient 08/31/24 2205     (approximate)   History   Chief Complaint Abdominal Pain and Rectal Bleeding   HPI  Carly Miller is a 48 y.o. female with past medical history of PID, TOA, and hysterectomy who presents to the ED complaining of abdominal pain and rectal bleeding.  Patient reports that since waking up this morning she has been dealing with crampy pain in the bilateral lower quadrants of her abdomen.  She denies any associated nausea or vomiting, but has had multiple incidences of bright red blood per rectum since onset of pain.  She has not passed any bloody stool and denies any melena.  She denies significant rectal pain and denies any history of hemorrhoids.  She does not take any blood thinners and denies history of similar bleeding.     Physical Exam   Triage Vital Signs: ED Triage Vitals  Encounter Vitals Group     BP 08/31/24 1736 124/87     Girls Systolic BP Percentile --      Girls Diastolic BP Percentile --      Boys Systolic BP Percentile --      Boys Diastolic BP Percentile --      Pulse Rate 08/31/24 1736 89     Resp 08/31/24 1736 20     Temp 08/31/24 1736 98 F (36.7 C)     Temp Source 08/31/24 1736 Oral     SpO2 08/31/24 1736 100 %     Weight 08/31/24 1737 163 lb (73.9 kg)     Height 08/31/24 1737 5' 6 (1.676 m)     Head Circumference --      Peak Flow --      Pain Score --      Pain Loc --      Pain Education --      Exclude from Growth Chart --     Most recent vital signs: Vitals:   08/31/24 1736 08/31/24 2117  BP: 124/87 119/84  Pulse: 89 95  Resp: 20 18  Temp: 98 F (36.7 C) 98 F (36.7 C)  SpO2: 100% 100%    Constitutional: Alert and oriented. Eyes: Conjunctivae are normal. Head: Atraumatic. Nose: No congestion/rhinnorhea. Mouth/Throat: Mucous membranes are moist.  Cardiovascular: Normal rate, regular rhythm. Grossly normal  heart sounds.  2+ radial pulses bilaterally. Respiratory: Normal respiratory effort.  No retractions. Lungs CTAB. Gastrointestinal: Soft and tender to palpation in the bilateral lower quadrants without rebound or guarding. No distention. Musculoskeletal: No lower extremity tenderness nor edema.  Neurologic:  Normal speech and language. No gross focal neurologic deficits are appreciated.    ED Results / Procedures / Treatments   Labs (all labs ordered are listed, but only abnormal results are displayed) Labs Reviewed  URINALYSIS, ROUTINE W REFLEX MICROSCOPIC - Abnormal; Notable for the following components:      Result Value   Color, Urine YELLOW (*)    APPearance HAZY (*)    Ketones, ur 20 (*)    Leukocytes,Ua SMALL (*)    Bacteria, UA FEW (*)    All other components within normal limits  COMPREHENSIVE METABOLIC PANEL WITH GFR  CBC  HEMOGLOBIN AND HEMATOCRIT, BLOOD  POC OCCULT BLOOD, ED  TYPE AND SCREEN     PROCEDURES:  Critical Care performed: No  Procedures   MEDICATIONS ORDERED IN ED: Medications - No data to display   IMPRESSION /  MDM / ASSESSMENT AND PLAN / ED COURSE  I reviewed the triage vital signs and the nursing notes.                              48 y.o. female with past medical history of PID, TOA, and hysterectomy who presents to the ED complaining of crampy lower abdominal pain with rectal bleeding starting earlier today.  Patient's presentation is most consistent with acute presentation with potential threat to life or bodily function.  Differential diagnosis includes, but is not limited to, upper GI bleed, lower GI bleed, rectal bleeding, hemorrhoids, anal fissure, diverticulitis, appendicitis, anemia.  Patient nontoxic-appearing and in no acute distress, vital signs are unremarkable.  Her abdomen is soft but she has tenderness to the bilateral lower quadrants, rectal exam without obvious source of bleeding.  Labs are reassuring without significant  anemia, leukocytosis, electrolyte abnormality, or AKI.  LFTs are also unremarkable, patient declines pain or nausea medication.  Repeat H&H stable compared to previous, low suspicion for significant bleeding at this time but will check CTA of her abdomen/pelvis given tenderness.  Patient turned over to on-call provider pending CT results.  If imaging is unremarkable then she would be appropriate for discharge home with outpatient GI follow-up.      FINAL CLINICAL IMPRESSION(S) / ED DIAGNOSES   Final diagnoses:  Lower GI bleed     Rx / DC Orders   ED Discharge Orders     None        Note:  This document was prepared using Dragon voice recognition software and may include unintentional dictation errors.   Willo Dunnings, MD 08/31/24 [phone]

## 2024-09-01 DIAGNOSIS — F32A Depression, unspecified: Secondary | ICD-10-CM | POA: Insufficient documentation

## 2024-09-01 DIAGNOSIS — K529 Noninfective gastroenteritis and colitis, unspecified: Secondary | ICD-10-CM | POA: Diagnosis present

## 2024-09-01 DIAGNOSIS — G629 Polyneuropathy, unspecified: Secondary | ICD-10-CM

## 2024-09-01 DIAGNOSIS — Z853 Personal history of malignant neoplasm of breast: Secondary | ICD-10-CM | POA: Diagnosis not present

## 2024-09-01 LAB — HEMOGLOBIN AND HEMATOCRIT, BLOOD
HCT: 38.4 % (ref 36.0–46.0)
HCT: 35.9 % — ABNORMAL LOW (ref 36.0–46.0)
Hemoglobin: 12.8 g/dL (ref 12.0–15.0)
Hemoglobin: 11.8 g/dL — ABNORMAL LOW (ref 12.0–15.0)

## 2024-09-01 LAB — HIV ANTIBODY (ROUTINE TESTING W REFLEX): HIV Screen 4th Generation wRfx: NONREACTIVE

## 2024-09-01 LAB — C DIFFICILE QUICK SCREEN W PCR REFLEX
C Diff antigen: NEGATIVE
C Diff interpretation: NOT DETECTED
C Diff toxin: NEGATIVE

## 2024-09-01 MED ORDER — METRONIDAZOLE 500 MG/100ML IV SOLN
500.0000 mg | Freq: Once | INTRAVENOUS | Status: AC
  Administered 2024-09-01: 500 mg via INTRAVENOUS
  Filled 2024-09-01: qty 100

## 2024-09-01 MED ORDER — ACETAMINOPHEN 650 MG RE SUPP: 650.0000 mg | Freq: Four times a day (QID) | RECTAL | Status: DC | PRN

## 2024-09-01 MED ORDER — MORPHINE SULFATE (PF) 2 MG/ML IV SOLN: 2.0000 mg | INTRAVENOUS | Status: DC | PRN

## 2024-09-01 MED ORDER — CIPROFLOXACIN IN D5W 400 MG/200ML IV SOLN
400.0000 mg | Freq: Once | INTRAVENOUS | Status: AC
  Administered 2024-09-01: 400 mg via INTRAVENOUS
  Filled 2024-09-01: qty 200

## 2024-09-01 MED ORDER — ONDANSETRON HCL 4 MG PO TABS: 4.0000 mg | ORAL_TABLET | Freq: Four times a day (QID) | ORAL | Status: DC | PRN

## 2024-09-01 MED ORDER — ZOLPIDEM TARTRATE 5 MG PO TABS
5.0000 mg | ORAL_TABLET | Freq: Once | ORAL | Status: AC
  Administered 2024-09-01: 5 mg via ORAL
  Filled 2024-09-01: qty 1

## 2024-09-01 MED ORDER — SODIUM CHLORIDE 0.9 % IV SOLN: INTRAVENOUS | Status: AC

## 2024-09-01 MED ORDER — SODIUM CHLORIDE 0.9 % IV SOLN: 2.0000 g | INTRAVENOUS | Status: DC

## 2024-09-01 MED ORDER — ESCITALOPRAM OXALATE 10 MG PO TABS
10.0000 mg | ORAL_TABLET | Freq: Every day | ORAL | Status: DC
  Administered 2024-09-01 – 2024-09-02 (×2): 10 mg via ORAL
  Filled 2024-09-01 (×2): qty 1

## 2024-09-01 MED ORDER — PEG 3350-KCL-NA BICARB-NACL 420 G PO SOLR
4000.0000 mL | Freq: Once | ORAL | Status: AC
  Administered 2024-09-01: 4000 mL via ORAL
  Filled 2024-09-01: qty 4000

## 2024-09-01 MED ORDER — METRONIDAZOLE 500 MG/100ML IV SOLN
500.0000 mg | Freq: Two times a day (BID) | INTRAVENOUS | Status: DC
  Administered 2024-09-01 – 2024-09-02 (×2): 500 mg via INTRAVENOUS
  Filled 2024-09-01 (×3): qty 100

## 2024-09-01 MED ORDER — ONDANSETRON HCL 4 MG/2ML IJ SOLN
4.0000 mg | Freq: Once | INTRAMUSCULAR | Status: AC
  Administered 2024-09-01: 4 mg via INTRAVENOUS
  Filled 2024-09-01: qty 2

## 2024-09-01 MED ORDER — ONDANSETRON HCL 4 MG/2ML IJ SOLN
4.0000 mg | Freq: Four times a day (QID) | INTRAMUSCULAR | Status: DC | PRN
  Administered 2024-09-01: 4 mg via INTRAVENOUS
  Filled 2024-09-01: qty 2

## 2024-09-01 MED ORDER — MORPHINE SULFATE (PF) 4 MG/ML IV SOLN
4.0000 mg | Freq: Once | INTRAVENOUS | Status: AC
  Administered 2024-09-01: 4 mg via INTRAVENOUS
  Filled 2024-09-01: qty 1

## 2024-09-01 MED ORDER — SODIUM CHLORIDE 0.9 % IV SOLN
2.0000 g | INTRAVENOUS | Status: DC
  Administered 2024-09-01 – 2024-09-02 (×2): 2 g via INTRAVENOUS
  Filled 2024-09-01 (×2): qty 20

## 2024-09-01 MED ORDER — GABAPENTIN 300 MG PO CAPS: 300.0000 mg | ORAL_CAPSULE | Freq: Three times a day (TID) | ORAL | Status: DC | PRN

## 2024-09-01 MED ORDER — ADULT MULTIVITAMIN W/MINERALS CH
1.0000 | ORAL_TABLET | Freq: Every day | ORAL | Status: DC
  Administered 2024-09-01 – 2024-09-02 (×2): 1 via ORAL
  Filled 2024-09-01 (×2): qty 1

## 2024-09-01 MED ORDER — ACETAMINOPHEN 325 MG PO TABS
650.0000 mg | ORAL_TABLET | Freq: Four times a day (QID) | ORAL | Status: DC | PRN
  Administered 2024-09-01: 650 mg via ORAL
  Filled 2024-09-01: qty 2

## 2024-09-01 MED ORDER — ZOLPIDEM TARTRATE 5 MG PO TABS: 5.0000 mg | ORAL_TABLET | Freq: Every evening | ORAL | Status: DC | PRN

## 2024-09-01 MED ADMIN — Tamoxifen Citrate Tab 10 MG (Base Equivalent): 20 mg | ORAL | NDC 75907005860

## 2024-09-01 MED FILL — Tamoxifen Citrate Tab 10 MG (Base Equivalent): 20.0000 mg | ORAL | Qty: 2 | Status: AC

## 2024-09-01 NOTE — ED Provider Notes (Signed)
----------------------------------------- °  2:20 AM on 09/01/2024 ----------------------------------------- Patient care assumed from Dr. Willo.  Patient CT scan has resulted showing a long segment of colitis involving the transverse and descending colon.  Patient continues to have frequent bowel movements that are small in volume but very bloody per patient.  Continues to have abdominal pain and cramping.  Given the patient symptoms with GI bleeding and significant colonic involvement we will start the patient on IV antibiotics.  Will dose pain medication as needed will admit to the hospital service for further workup and treatment.  Patient agreeable to plan.  Patient denies any personal or family history of colitis or inflammatory bowel disease.   Dorothyann Drivers, MD 09/01/24 [phone]

## 2024-09-01 NOTE — Consult Note (Signed)
 Consultation  Referring Provider: Hospitalist   Admit date: 08/31/2024 Consult date: 09/01/2024         Reason for Consultation: Abdominal pain/Hematochezia              HPI:   Carly Miller is a 48 y.o. lady with history of breast cancer, HLD, and endometriosis here with history of abdominal pain and hematochezia. This started suddenly yesterday. Never had something like this before. Had a normal colonoscopy last year. CT imaging suggested infectious/inflammatory colitis. C. Diff has been ruled out but rest of PCR test is pending. Passing clots but has improved today. No blood thinners. History of complicated abdominal surgery due intrabdominal abscess. No fevers currently. Abdominal pain started first and then diarrhea/hematochezia. No sick contacts. No weird food intake. Husband ate same thing as her and did not get sick.   Past Medical History:  Diagnosis Date   Breast cancer (HCC)    Endometriosis    Hematuria    History of radiation therapy    Right Breast- 12/29/21-02/12/22- Dr. Lynwood Nasuti   Hyperlipidemia    Mass of ovary    RIGHT   Pelvic pain in female    Personal history of radiation therapy    Urgency of urination    UTI (lower urinary tract infection)     Past Surgical History:  Procedure Laterality Date   ABDOMINAL HYSTERECTOMY N/A 08/31/2013   Procedure: HYSTERECTOMY ABDOMINAL, LYSIS OF ADHESIONS;  Surgeon: Alm JAYSON Cook, MD;  Location: [facility];  Service: Gynecology;  Laterality: N/A;   ABSCESS DRAINAGE  09/22/1999   RECTAL   ANAL SPHINCTEROTOMY  09/21/1998   AUGMENTATION MAMMAPLASTY     BREAST BIOPSY Right 10/27/2021   BREAST LUMPECTOMY WITH RADIOACTIVE SEED AND SENTINEL LYMPH NODE BIOPSY Right 11/25/2021   Procedure: RIGHT BREAST LUMPECTOMY WITH RADIOACTIVE SEED AND SENTINEL LYMPH NODE BIOPSY;  Surgeon: Vanderbilt Ned, MD;  Location: [facility];  Service: General;  Laterality: Right;  90 MINUTES ROOM 8   DIAGNOSTIC LAPAROSCOPY  LEFT OOPHORECTOMY  08/31/2007   ENDOMETRIOMA   LAPAROSCOPIC CHOLECYSTECTOMY  06/21/2012   LAPAROSCOPY N/A 08/31/2013   Procedure: LAPAROSCOPY DIAGNOSTIC;  Surgeon: Alm JAYSON Cook, MD;  Location: [facility];  Service: Gynecology;  Laterality: N/A;    Family History  Problem Relation Age of Onset   Asthma Mother    Thyroid disease Mother    Asthma Father    Diabetes Father    Thyroid disease Father    Kidney disease Sister    Thyroid disease Maternal Aunt    Thyroid disease Maternal Uncle    Leukemia Maternal Grandmother        dx. 31s   Breast cancer Neg Hx    Colon cancer Neg Hx    Colon polyps Neg Hx    Esophageal cancer Neg Hx    Stomach cancer Neg Hx    Rectal cancer Neg Hx     Social History[1]  Prior to Admission medications  Medication Sig Start Date End Date Taking? Authorizing Provider  escitalopram (LEXAPRO) 10 MG tablet Take 10 mg by mouth daily.   Yes [provider]  gabapentin  (NEURONTIN ) 300 MG capsule TAKE 1 CAPSULE BY MOUTH 3 TIMES DAILY AS NEEDED. 08/04/24  Yes Corey, Evan S, MD  Multiple Vitamin (MULTIVITAMIN) capsule Take 1 capsule by mouth daily.   Yes [provider]  ondansetron  (ZOFRAN ) 4 MG tablet Take 4 mg by mouth every 8 (eight) hours as needed. for nausea  Yes [provider]  tamoxifen  (NOLVADEX ) 20 MG tablet TAKE 1 TABLET BY MOUTH EVERY DAY 10/18/23  Yes Iruku, Praveena, MD  zolpidem  (AMBIEN ) 10 MG tablet zolpidem  10 mg tablet  TAKE ONE TABLET AT BEDTIME AS NEEDED FOR INSOMNIA   Yes [provider]    Current Facility-Administered Medications  Medication Dose Route Frequency Provider Last Rate Last Admin   0.9 %  sodium chloride  infusion   Intravenous Continuous Mansy, Jan A, MD 100 mL/hr at 09/01/24 0730 New Bag at 09/01/24 0730   acetaminophen  (TYLENOL ) tablet 650 mg  650 mg Oral Q6H PRN Mansy, Jan A, MD   650 mg at 09/01/24 1107   Or   acetaminophen  (TYLENOL ) suppository 650 mg  650 mg  Rectal Q6H PRN Mansy, Jan A, MD       cefTRIAXone (ROCEPHIN) 2 g in sodium chloride  0.9 % 100 mL IVPB  2 g Intravenous Q24H Mansy, Jan A, MD   Stopped at 09/01/24 0758   escitalopram (LEXAPRO) tablet 10 mg  10 mg Oral Daily Mansy, Jan A, MD   10 mg at 09/01/24 1148   gabapentin  (NEURONTIN ) capsule 300 mg  300 mg Oral TID PRN Mansy, Jan A, MD       metroNIDAZOLE (FLAGYL) IVPB 500 mg  500 mg Intravenous Q12H Mansy, Jan A, MD       morphine (PF) 2 MG/ML injection 2 mg  2 mg Intravenous Q4H PRN Mansy, Jan A, MD       multivitamin with minerals tablet 1 tablet  1 tablet Oral Daily Mansy, Jan A, MD   1 tablet at 09/01/24 1108   ondansetron  (ZOFRAN ) tablet 4 mg  4 mg Oral Q6H PRN Mansy, Jan A, MD       Or   ondansetron  (ZOFRAN ) injection 4 mg  4 mg Intravenous Q6H PRN Mansy, Jan A, MD       polyethylene glycol-electrolytes (NuLYTELY) solution 4,000 mL  4,000 mL Oral Once Yarexi Pawlicki T, MD       tamoxifen  (NOLVADEX ) tablet 20 mg  20 mg Oral Daily Mansy, Jan A, MD   20 mg at 09/01/24 1109   zolpidem  (AMBIEN ) tablet 5 mg  5 mg Oral QHS PRN Mansy, Madison LABOR, MD       Current Outpatient Medications  Medication Sig Dispense Refill   escitalopram (LEXAPRO) 10 MG tablet Take 10 mg by mouth daily.     gabapentin  (NEURONTIN ) 300 MG capsule TAKE 1 CAPSULE BY MOUTH 3 TIMES DAILY AS NEEDED. 90 capsule 2   Multiple Vitamin (MULTIVITAMIN) capsule Take 1 capsule by mouth daily.     ondansetron  (ZOFRAN ) 4 MG tablet Take 4 mg by mouth every 8 (eight) hours as needed. for nausea     tamoxifen  (NOLVADEX ) 20 MG tablet TAKE 1 TABLET BY MOUTH EVERY DAY 90 tablet 1   zolpidem  (AMBIEN ) 10 MG tablet zolpidem  10 mg tablet  TAKE ONE TABLET AT BEDTIME AS NEEDED FOR INSOMNIA      Allergies as of 08/31/2024 - Review Complete 08/31/2024  Allergen Reaction Noted   Naproxen sodium Other (See Comments), Anaphylaxis, and Hives 04/29/2012   Naproxen Other (See Comments) 09/17/2022     Review of Systems:    All systems  reviewed and negative except where noted in HPI.  Review of Systems  Constitutional:  Positive for chills. Negative for fever.  Respiratory:  Negative for shortness of breath.   Cardiovascular:  Negative for chest pain.  Gastrointestinal:  Positive for abdominal  pain, blood in stool and vomiting.  Musculoskeletal:  Negative for joint pain.  Skin:  Negative for rash.  Neurological:  Negative for focal weakness.  Psychiatric/Behavioral:  Negative for substance abuse.   All other systems reviewed and are negative.      Physical Exam:  Vital signs in last 24 hours: Temp:  [98 F (36.7 C)-98.5 F (36.9 C)] 98.5 F (36.9 C) (12/12 1212) Pulse Rate:  [78-95] 85 (12/12 1116) Resp:  [15-20] 15 (12/12 1116) BP: (92-124)/(62-87) 101/79 (12/12 1116) SpO2:  [95 %-100 %] 100 % (12/12 1116) Weight:  [73.9 kg] 73.9 kg (12/11 1750)   General:   Pleasant in NAD Head:  Normocephalic and atraumatic. Eyes:   No icterus.   Conjunctiva pink. Ears:  Normal auditory acuity. Mouth: Mucosa pink moist, no lesions. Neck:  Supple; no masses felt Lungs:  No respiratory distress Abdomen:   Flat, soft, nondistended, non-tender Rectal:  Not performed.  Msk:  No clubbing or cyanosis.Symmetrical without gross deformities. Neurologic:  Alert and  oriented x4;  No focal deficits Skin:  Warm, dry, pink without significant lesions or rashes. Psych:  Alert and cooperative. Normal affect.  LAB RESULTS: Recent Labs    08/31/24 1752 08/31/24 2230 09/01/24 1116  WBC 10.4  --   --   HGB 13.2 13.2 12.8  HCT 39.3 40.2 38.4  PLT 334  --   --    BMET Recent Labs    08/31/24 1752  NA 139  K 4.2  CL 102  CO2 25  GLUCOSE 86  BUN 13  CREATININE 0.73  CALCIUM 9.1   LFT Recent Labs    08/31/24 1752  PROT 6.8  ALBUMIN 4.3  AST 16  ALT 9  ALKPHOS 66  BILITOT 0.3   PT/INR No results for input(s): LABPROT, INR in the last 72 hours.  STUDIES: CT Angio Abd/Pel W and/or Wo Contrast Result  Date: 09/01/2024 EXAM: CTA ABDOMEN AND PELVIS WITHOUT AND WITH CONTRAST 09/01/2024 12:24:29 AM TECHNIQUE: CTA images of the abdomen and pelvis without and with 100 mL iohexol 350 mg/mL intravenous contrast. Three-dimensional MIP/volume rendered formations were performed. Automated exposure control, iterative reconstruction, and/or weight based adjustment of the mA/kV was utilized to reduce the radiation dose to as low as reasonably achievable. COMPARISON: None available. CLINICAL HISTORY: Lower GI bleed FINDINGS: VASCULATURE: AORTA: Atherosclerotic calcifications of the abdominal aorta. No acute finding. No abdominal aortic aneurysm. No dissection. CELIAC TRUNK: No acute finding. No occlusion or significant stenosis. SUPERIOR MESENTERIC ARTERY: No acute finding. No occlusion or significant stenosis. RENAL ARTERIES: No acute finding. No occlusion or significant stenosis. ILIAC ARTERIES: No acute finding. No occlusion or significant stenosis. LIVER: The liver is unremarkable. GALLBLADDER AND BILE DUCTS: Status post cholecystectomy. No biliary ductal dilatation. SPLEEN: The spleen is unremarkable. PANCREAS: The pancreas is unremarkable. ADRENAL GLANDS: Bilateral adrenal glands demonstrate no acute abnormality. KIDNEYS, URETERS AND BLADDER: No stones in the kidneys or ureters. No hydronephrosis. No perinephric or periureteral stranding. Urinary bladder is unremarkable. GI AND BOWEL: Stomach demonstrates a small hiatal hernia. Duodenal sweep demonstrates no acute abnormality. Long segment wall thickening with pericolonic stranding involving the transverse and descending colon (postcontrast image 103), reflecting infectious or inflammatory colitis. Normal appendix (image 62). There is no bowel obstruction. Following contrast administration, there is no intraluminal spillage of contrast to suggest active GI bleeding. REPRODUCTIVE: Status post hysterectomy. PERITONEUM AND RETRPERITONEUM: No ascites or free air. LUNG BASE:  No acute abnormality. LYMPH NODES: No lymphadenopathy. BONES AND SOFT  TISSUES: Bilateral breast augmentation. No acute abnormality of the bones. No acute soft tissue abnormality. IMPRESSION: 1. No active gastrointestinal bleeding. 2. Long segment infectious/inflammatory colitis involving the transverse and descending colon. Electronically signed by: Pinkie Pebbles MD 09/01/2024 12:28 AM EST RP Workstation: [id number]       Impression / Plan:   48 y.o. lady with history of breast cancer, HLD, and endometriosis here with history of abdominal pain and hematochezia. C. Diff negative. Awaiting rest of stool pcr. Suspect ischemic colitis although no real strong risk factors for this. IBD on differential as well.  - will prepare for colonoscopy for tomorrow - clear liquids, NPO at midnight - golytely prep ordered - f/u GI stool pcr which is pending - further recs after colonoscopy tomorrow  Please call with any questions or concerns.  Frederic Schick MD, MPH [facility]      [1]  Social History Tobacco Use   Smoking status: Never   Smokeless tobacco: Never  Vaping Use   Vaping status: Never Used  Substance Use Topics   Alcohol use: No   Drug use: No

## 2024-09-01 NOTE — H&P (Signed)
 [place]   PATIENT NAME: Carly Miller    MR#:  [id number]  DATE OF BIRTH:  [birth year]  DATE OF ADMISSION:  08/31/2024  PRIMARY CARE PHYSICIAN: Marget Lenis, MD   Patient is coming from: Home  REQUESTING/REFERRING PHYSICIAN: Dorothyann Drivers, MD  CHIEF COMPLAINT:   Chief Complaint  Patient presents with   Abdominal Pain   Rectal Bleeding    HISTORY OF PRESENT ILLNESS:  Carly Miller is a 48 y.o. Caucasian female with medical history significant for right breast cancer status post lumpectomy and lymph node dissection and dyslipidemia, presented to the emergency room with acute onset of bright red bleeding per rectum.  She has been having diarrhea initially which has been watery and then she started noticing blood clots.  She has asked if she needed lower abdominal crampy pain.  She admitted to chills as well as nausea without vomiting.  She did not check her temperature.  No chest pain or palpitations.  No headache or dizziness or blurred vision.  No presyncope or syncope.  No dysuria, oliguria or hematuria or flank pain.  No cough or wheezing or hemoptysis.  No other bleeding diathesis.  ED Course: When she came to the ER, vital signs were within normal.  Labs revealed unremarkable CMP and CBC.  Hemoglobin and hematocrit have been stable.  UA was positive for UTI. EKG as reviewed by me :  EKG showed normal sinus rhythm with a rate of 82 sinus arrhythmia right atrial management with right axis deviation. Imaging: Abdominal pelvic CT scan with and without contrast revealed the following: 1. No active gastrointestinal bleeding. 2. Long segment infectious/inflammatory colitis involving the transverse and descending colon.  The patient was given 4 mg of IV Zofran  and 4 mg of IV morphine sulfate, IV Flagyl and Cipro.  She will be admitted to a progressive unit bed for further evaluation and management.  PAST MEDICAL HISTORY:   Past Medical History:  Diagnosis Date   Breast  cancer (HCC)    Endometriosis    Hematuria    History of radiation therapy    Right Breast- 12/29/21-02/12/22- Dr. Lynwood Nasuti   Hyperlipidemia    Mass of ovary    RIGHT   Pelvic pain in female    Personal history of radiation therapy    Urgency of urination    UTI (lower urinary tract infection)     PAST SURGICAL HISTORY:   Past Surgical History:  Procedure Laterality Date   ABDOMINAL HYSTERECTOMY N/A 08/31/2013   Procedure: HYSTERECTOMY ABDOMINAL, LYSIS OF ADHESIONS;  Surgeon: Lenis JAYSON Marget, MD;  Location: [facility];  Service: Gynecology;  Laterality: N/A;   ABSCESS DRAINAGE  09/22/1999   RECTAL   ANAL SPHINCTEROTOMY  09/21/1998   AUGMENTATION MAMMAPLASTY     BREAST BIOPSY Right 10/27/2021   BREAST LUMPECTOMY WITH RADIOACTIVE SEED AND SENTINEL LYMPH NODE BIOPSY Right 11/25/2021   Procedure: RIGHT BREAST LUMPECTOMY WITH RADIOACTIVE SEED AND SENTINEL LYMPH NODE BIOPSY;  Surgeon: Vanderbilt Ned, MD;  Location: [facility];  Service: General;  Laterality: Right;  90 MINUTES ROOM 8   DIAGNOSTIC LAPAROSCOPY LEFT OOPHORECTOMY  08/31/2007   ENDOMETRIOMA   LAPAROSCOPIC CHOLECYSTECTOMY  06/21/2012   LAPAROSCOPY N/A 08/31/2013   Procedure: LAPAROSCOPY DIAGNOSTIC;  Surgeon: Lenis JAYSON Marget, MD;  Location: [facility];  Service: Gynecology;  Laterality: N/A;    SOCIAL HISTORY:   Social History   Tobacco Use   Smoking status: Never  Smokeless tobacco: Never  Substance Use Topics   Alcohol use: No    FAMILY HISTORY:   Family History  Problem Relation Age of Onset   Asthma Mother    Thyroid disease Mother    Asthma Father    Diabetes Father    Thyroid disease Father    Kidney disease Sister    Thyroid disease Maternal Aunt    Thyroid disease Maternal Uncle    Leukemia Maternal Grandmother        dx. 9s   Breast cancer Neg Hx    Colon cancer Neg Hx    Colon polyps Neg Hx    Esophageal cancer Neg Hx    Stomach cancer Neg Hx     Rectal cancer Neg Hx     DRUG ALLERGIES:  Allergies[1]  REVIEW OF SYSTEMS:   ROS As per history of present illness. All pertinent systems were reviewed above. Constitutional, HEENT, cardiovascular, respiratory, GI, GU, musculoskeletal, neuro, psychiatric, endocrine, integumentary and hematologic systems were reviewed and are otherwise negative/unremarkable except for positive findings mentioned above in the HPI.   MEDICATIONS AT HOME:   Prior to Admission medications  Medication Sig Start Date End Date Taking? Authorizing Provider  escitalopram (LEXAPRO) 10 MG tablet Take 10 mg by mouth daily.   Yes [provider]  gabapentin  (NEURONTIN ) 300 MG capsule TAKE 1 CAPSULE BY MOUTH 3 TIMES DAILY AS NEEDED. 08/04/24  Yes Corey, Evan S, MD  Multiple Vitamin (MULTIVITAMIN) capsule Take 1 capsule by mouth daily.   Yes [provider]  ondansetron  (ZOFRAN ) 4 MG tablet Take 4 mg by mouth every 8 (eight) hours as needed. for nausea   Yes [provider]  tamoxifen  (NOLVADEX ) 20 MG tablet TAKE 1 TABLET BY MOUTH EVERY DAY 10/18/23  Yes Iruku, Praveena, MD  zolpidem  (AMBIEN ) 10 MG tablet zolpidem  10 mg tablet  TAKE ONE TABLET AT BEDTIME AS NEEDED FOR INSOMNIA   Yes [provider]      VITAL SIGNS:  Blood pressure 92/71, pulse 81, temperature 98 F (36.7 C), temperature source Oral, resp. rate 16, height 5' 6 (1.676 m), weight 73.9 kg, last menstrual period 08/14/2013, SpO2 96%.  PHYSICAL EXAMINATION:  Physical Exam  GENERAL:  48 y.o.-year-old patient lying in the bed with no acute distress.  EYES: Pupils equal, round, reactive to light and accommodation. No scleral icterus. Extraocular muscles intact.  HEENT: Head atraumatic, normocephalic. Oropharynx and nasopharynx clear.  NECK:  Supple, no jugular venous distention. No thyroid enlargement, no tenderness.  LUNGS: Normal breath sounds bilaterally, no wheezing, rales,rhonchi or crepitation. No use of  accessory muscles of respiration.  CARDIOVASCULAR: Regular rate and rhythm, S1, S2 normal. No murmurs, rubs, or gallops.  ABDOMEN: Soft, nondistended, with lower abdominal tenderness mainly in the left lower quadrant without rebound tenderness guarding or rigidity.  Bowel sounds present. No organomegaly or mass.  EXTREMITIES: No pedal edema, cyanosis, or clubbing.  NEUROLOGIC: Cranial nerves II through XII are intact. Muscle strength 5/5 in all extremities. Sensation intact. Gait not checked.  PSYCHIATRIC: The patient is alert and oriented x 3.  Normal affect and good eye contact. SKIN: No obvious rash, lesion, or ulcer.   LABORATORY PANEL:   CBC Recent Labs  Lab 08/31/24 1752 08/31/24 2230  WBC 10.4  --   HGB 13.2 13.2  HCT 39.3 40.2  PLT 334  --    ------------------------------------------------------------------------------------------------------------------  Chemistries  Recent Labs  Lab 08/31/24 1752  NA 139  K 4.2  CL 102  CO2 25  GLUCOSE 86  BUN 13  CREATININE 0.73  CALCIUM 9.1  AST 16  ALT 9  ALKPHOS 66  BILITOT 0.3   ------------------------------------------------------------------------------------------------------------------  Cardiac Enzymes No results for input(s): TROPONINI in the last 168 hours. ------------------------------------------------------------------------------------------------------------------  RADIOLOGY:  CT Angio Abd/Pel W and/or Wo Contrast Result Date: 09/01/2024 EXAM: CTA ABDOMEN AND PELVIS WITHOUT AND WITH CONTRAST 09/01/2024 12:24:29 AM TECHNIQUE: CTA images of the abdomen and pelvis without and with 100 mL iohexol 350 mg/mL intravenous contrast. Three-dimensional MIP/volume rendered formations were performed. Automated exposure control, iterative reconstruction, and/or weight based adjustment of the mA/kV was utilized to reduce the radiation dose to as low as reasonably achievable. COMPARISON: None available. CLINICAL  HISTORY: Lower GI bleed FINDINGS: VASCULATURE: AORTA: Atherosclerotic calcifications of the abdominal aorta. No acute finding. No abdominal aortic aneurysm. No dissection. CELIAC TRUNK: No acute finding. No occlusion or significant stenosis. SUPERIOR MESENTERIC ARTERY: No acute finding. No occlusion or significant stenosis. RENAL ARTERIES: No acute finding. No occlusion or significant stenosis. ILIAC ARTERIES: No acute finding. No occlusion or significant stenosis. LIVER: The liver is unremarkable. GALLBLADDER AND BILE DUCTS: Status post cholecystectomy. No biliary ductal dilatation. SPLEEN: The spleen is unremarkable. PANCREAS: The pancreas is unremarkable. ADRENAL GLANDS: Bilateral adrenal glands demonstrate no acute abnormality. KIDNEYS, URETERS AND BLADDER: No stones in the kidneys or ureters. No hydronephrosis. No perinephric or periureteral stranding. Urinary bladder is unremarkable. GI AND BOWEL: Stomach demonstrates a small hiatal hernia. Duodenal sweep demonstrates no acute abnormality. Long segment wall thickening with pericolonic stranding involving the transverse and descending colon (postcontrast image 103), reflecting infectious or inflammatory colitis. Normal appendix (image 62). There is no bowel obstruction. Following contrast administration, there is no intraluminal spillage of contrast to suggest active GI bleeding. REPRODUCTIVE: Status post hysterectomy. PERITONEUM AND RETRPERITONEUM: No ascites or free air. LUNG BASE: No acute abnormality. LYMPH NODES: No lymphadenopathy. BONES AND SOFT TISSUES: Bilateral breast augmentation. No acute abnormality of the bones. No acute soft tissue abnormality. IMPRESSION: 1. No active gastrointestinal bleeding. 2. Long segment infectious/inflammatory colitis involving the transverse and descending colon. Electronically signed by: Pinkie Pebbles MD 09/01/2024 12:28 AM EST RP Workstation: [id number]      IMPRESSION AND PLAN:  Assessment and Plan: *  Hemorrhagic colitis - This is likely the culprit for GI bleeding. - She will be admitted to a progressive unit bed. - Continue antibiotic therapy with IV Rocephin and Flagyl. - Will follow serial hemoglobins. - GI consult will be obtained. - I notified Dr. Unk about the patient.  Depression - Will continue Lexapro.  Peripheral neuropathy - Continue Neurontin .  History of breast cancer - Will continue tamoxifen    DVT prophylaxis: SCDs. Advanced Care Planning:  Code Status: full code.  Family Communication:  The plan of care was discussed in details with the patient (and family). I answered all questions. The patient agreed to proceed with the above mentioned plan. Further management will depend upon hospital course. Disposition Plan: Back to previous home environment Consults called: GI. All the records are reviewed and case discussed with ED provider.  Status is: Inpatient  At the time of the admission, it appears that the appropriate admission status for this patient is inpatient.  This is judged to be reasonable and necessary in order to provide the required intensity of service to ensure the patient's safety given the presenting symptoms, physical exam findings and initial radiographic and laboratory data in the context of comorbid conditions.  The patient requires inpatient status due to  high intensity of service, high risk of further deterioration and high frequency of surveillance required.  I certify that at the time of admission, it is my clinical judgment that the patient will require inpatient hospital care extending more than 2 midnights.                            Dispo: The patient is from: Home              Anticipated d/c is to: Home              Patient currently is not medically stable to d/c.              Difficult to place patient: No  Madison DELENA Peaches M.D on 09/01/2024 at 7:08 AM  [facility]   From 7 PM-7 AM, contact  night-coverage [email or web address]  CC: Primary care physician; Marget Lenis, MD     [1]  Allergies Allergen Reactions   Naproxen Sodium Other (See Comments), Anaphylaxis and Hives    Feel like I'm choking when I take it feels like I'm choking Feel like I'm choking when I take it    Naproxen Other (See Comments)

## 2024-09-01 NOTE — Hospital Course (Addendum)
 Carly Miller is a 48 y.o. lady with history of breast cancer, HLD, and endometriosis here with history of abdominal pain and hematochezia. This started suddenly yesterday. Never had something like this before. Had a normal colonoscopy last year. CT imaging suggested infectious/inflammatory colitis. C. Diff has been ruled out but rest of PCR test is pending. Passing clots but has improved today. No blood thinners. History of complicated abdominal surgery due intrabdominal abscess. No fevers currently. Abdominal pain started first and then diarrhea/hematochezia. No sick contacts. No weird food intake. Husband ate same thing as her and did not get sick.  Patient underwent flexible colonoscopy which revealed erythematous mucosa in the descending colon which was biopsied.  Gastroenterologist advised to start regular diet and discharge patient home and follow-up with GI as outpatient for biopsy result. Patient was discharged home.  Continue Cipro  and Flagyl  for 5 days

## 2024-09-01 NOTE — Assessment & Plan Note (Signed)
-   This is likely the culprit for GI bleeding. - She will be admitted to a progressive unit bed. - Continue antibiotic therapy with IV Rocephin and Flagyl. - Will follow serial hemoglobins. - GI consult will be obtained. - I notified Dr. Unk about the patient.

## 2024-09-01 NOTE — Assessment & Plan Note (Signed)
-   Will continue tamoxifen.

## 2024-09-01 NOTE — Progress Notes (Signed)
°  Progress Note   Patient: Carly Miller FMW:[id number] DOB: Sep 26, 1975 DOA: 08/31/2024     0 DOS: the patient was seen and examined on 09/01/2024   Brief hospital course: Carly Miller is a 48 y.o. lady with history of breast cancer, HLD, and endometriosis here with history of abdominal pain and hematochezia. This started suddenly yesterday. Never had something like this before. Had a normal colonoscopy last year. CT imaging suggested infectious/inflammatory colitis. C. Diff has been ruled out but rest of PCR test is pending. Passing clots but has improved today. No blood thinners. History of complicated abdominal surgery due intrabdominal abscess. No fevers currently. Abdominal pain started first and then diarrhea/hematochezia. No sick contacts. No weird food intake. Husband ate same thing as her and did not get sick.   Assessment and Plan: 48-year female with history of breast cancer, HLD, endometriosis who was admitted for abdominal pain and hematochezia.  She had history of eating hamburger with her husband.  Denies any fever or chills.  She does not have a risk factor for  ischemic colitis or C. difficile colitis Infective versus inflammatory colitis/?  Gastroenteritis - To me does not appear that she has strong risk for colitis. - However she came in with severe symptoms. - Continue antibiotic therapy with IV Rocephin and Flagyl. - Continue to monitor hemoglobin hematocrit - GI consult appreciated and they are planning on possible colonoscopy tomorrow - IV fluid, n.p.o. past midnight clears for now  Depression - Will continue Lexapro.  Peripheral neuropathy - Continue Neurontin .  History of breast cancer - Will continue tamoxifen       Subjective: Feels better but he still has a bowel movement with blood  Physical Exam: Vitals:   09/01/24 1116 09/01/24 1147 09/01/24 1212 09/01/24 1630  BP: 101/79   100/75  Pulse: 85   91  Resp: 15   16  Temp:  98.3 F (36.8 C) 98.5 F (36.9  C)   TempSrc:  Oral    SpO2: 100%   100%  Weight:      Height:       Constitutional: Alert, awake, calm, comfortable HEENT: Neck supple Respiratory: Clear to auscultation B/L, no wheezing, no rales.  Cardiovascular: Regular rate and rhythm, no murmurs / rubs / gallops. No extremity edema. 2+ pedal pulses. No carotid bruits.  Abdomen: Soft, no tenderness, Bowel sounds positive.  Musculoskeletal: no clubbing / cyanosis. Good ROM, no contractures. Normal muscle tone.  Skin: no rashes, lesions, ulcers. Neurologic: CN 2-12 grossly intact. Sensation intact, No focal deficit identified Psychiatric: Alert and oriented x 3. Normal mood.    Data Reviewed:  Reviewedhemoglobin is 12.8  Family Communication: Was at bedside  Disposition: Status is: Inpatient Remains inpatient appropriate because: Ongoing recovery from hemorrhagic colitis  Planned Discharge Destination: Home    Time spent: 35 minutes  Author: Nena Rebel, MD 09/01/2024 4:49 PM  For on call review [email or web address].

## 2024-09-01 NOTE — ED Notes (Signed)
 This RN gave report to [place], [facility]

## 2024-09-01 NOTE — ED Notes (Signed)
 Pt ambulated to restroom without assistance of staff. Pt had a even, steady gait. Pt back in hospital bed and VS monitoring back on. Bed in lowest, locked position with call bell in reach.

## 2024-09-01 NOTE — Assessment & Plan Note (Signed)
-   Will continue Lexapro. 

## 2024-09-01 NOTE — Assessment & Plan Note (Signed)
 Continue Neurontin

## 2024-09-02 ENCOUNTER — Encounter: Admission: EM | Disposition: A | Payer: Self-pay | Source: Home / Self Care | Attending: Emergency Medicine

## 2024-09-02 ENCOUNTER — Encounter: Payer: Self-pay | Admitting: Family Medicine

## 2024-09-02 ENCOUNTER — Other Ambulatory Visit: Payer: Self-pay

## 2024-09-02 ENCOUNTER — Inpatient Hospital Stay: Admitting: General Practice

## 2024-09-02 DIAGNOSIS — K529 Noninfective gastroenteritis and colitis, unspecified: Secondary | ICD-10-CM | POA: Diagnosis not present

## 2024-09-02 LAB — CBC
HCT: 35 % — ABNORMAL LOW (ref 36.0–46.0)
Hemoglobin: 11.7 g/dL — ABNORMAL LOW (ref 12.0–15.0)
MCH: 29.8 pg (ref 26.0–34.0)
MCHC: 33.4 g/dL (ref 30.0–36.0)
MCV: 89.3 fL (ref 80.0–100.0)
Platelets: 292 K/uL (ref 150–400)
RBC: 3.92 MIL/uL (ref 3.87–5.11)
RDW: 12.7 % (ref 11.5–15.5)
WBC: 8.8 K/uL (ref 4.0–10.5)
nRBC: 0 % (ref 0.0–0.2)

## 2024-09-02 LAB — BASIC METABOLIC PANEL WITH GFR
Anion gap: 11 (ref 5–15)
BUN: 5 mg/dL — ABNORMAL LOW (ref 6–20)
CO2: 23 mmol/L (ref 22–32)
Calcium: 8.6 mg/dL — ABNORMAL LOW (ref 8.9–10.3)
Chloride: 106 mmol/L (ref 98–111)
Creatinine, Ser: 0.55 mg/dL (ref 0.44–1.00)
GFR, Estimated: >60 mL/min (ref >60)
Glucose, Bld: 84 mg/dL (ref 70–99)
Potassium: 3.6 mmol/L (ref 3.5–5.1)
Sodium: 141 mmol/L (ref 135–145)

## 2024-09-02 LAB — HEMOGLOBIN AND HEMATOCRIT, BLOOD
HCT: 36.1 % (ref 36.0–46.0)
Hemoglobin: 12.3 g/dL (ref 12.0–15.0)

## 2024-09-02 SURGERY — COLONOSCOPY
Anesthesia: General

## 2024-09-02 MED ORDER — PROPOFOL 500 MG/50ML IV EMUL
INTRAVENOUS | Status: DC | PRN
  Administered 2024-09-02: 120 ug/kg/min via INTRAVENOUS

## 2024-09-02 MED ORDER — LIDOCAINE 2% (20 MG/ML) 5 ML SYRINGE
INTRAMUSCULAR | Status: DC | PRN
  Administered 2024-09-02: 20 mg via INTRAVENOUS

## 2024-09-02 MED ORDER — SODIUM CHLORIDE 0.9 % IV SOLN
12.5000 mg | Freq: Four times a day (QID) | INTRAVENOUS | Status: DC | PRN
  Administered 2024-09-02: 12.5 mg via INTRAVENOUS
  Filled 2024-09-02: qty 0.5

## 2024-09-02 MED ORDER — PROPOFOL 10 MG/ML IV BOLUS
INTRAVENOUS | Status: DC | PRN
  Administered 2024-09-02: 100 mg via INTRAVENOUS

## 2024-09-02 MED ORDER — METRONIDAZOLE 500 MG PO TABS
500.0000 mg | ORAL_TABLET | Freq: Three times a day (TID) | ORAL | 0 refills | Status: AC
  Filled 2024-09-02 – 2024-09-03 (×2): qty 15, 5d supply, fill #0

## 2024-09-02 MED ORDER — MIDAZOLAM HCL 2 MG/2ML IJ SOLN
INTRAMUSCULAR | Status: AC
  Filled 2024-09-02: qty 2

## 2024-09-02 MED ORDER — SODIUM CHLORIDE 0.9 % IV SOLN: INTRAVENOUS | Status: DC

## 2024-09-02 MED ORDER — CIPROFLOXACIN HCL 500 MG PO TABS: 500.0000 mg | ORAL_TABLET | Freq: Two times a day (BID) | ORAL | 0 refills | Status: AC

## 2024-09-02 MED ADMIN — Tamoxifen Citrate Tab 10 MG (Base Equivalent): 20 mg | ORAL | NDC 75907005860

## 2024-09-02 NOTE — Anesthesia Preprocedure Evaluation (Signed)
 Anesthesia Evaluation  Patient identified by MRN, date of birth, ID band Patient awake    Reviewed: Allergy & Precautions, NPO status , Patient's Chart, lab work & pertinent test results  History of Anesthesia Complications Negative for: history of anesthetic complications  Airway Mallampati: II  TM Distance: >3 FB Neck ROM: Full    Dental  (+) Dental Advisory Given   Pulmonary neg pulmonary ROS   Pulmonary exam normal        Cardiovascular negative cardio ROS Normal cardiovascular exam     Neuro/Psych  PSYCHIATRIC DISORDERS  Depression    negative neurological ROS     GI/Hepatic negative GI ROS, Neg liver ROS,,,  Endo/Other  negative endocrine ROS    Renal/GU negative Renal ROS  negative genitourinary   Musculoskeletal   Abdominal   Peds  Hematology negative hematology ROS (+)   Anesthesia Other Findings Past Medical History: No date: Breast cancer (HCC) No date: Endometriosis No date: Hematuria No date: History of radiation therapy     Comment:  Right Breast- 12/29/21-02/12/22- Dr. Lynwood Nasuti No date: Hyperlipidemia No date: Mass of ovary     Comment:  RIGHT No date: Pelvic pain in female No date: Personal history of radiation therapy No date: Urgency of urination No date: UTI (lower urinary tract infection)  Past Surgical History: 08/31/2013: ABDOMINAL HYSTERECTOMY; N/A     Comment:  Procedure: HYSTERECTOMY ABDOMINAL, LYSIS OF ADHESIONS;                Surgeon: Alm JAYSON Cook, MD;  Location: [facility];  Service: Gynecology;  Laterality: N/A; 09/22/1999: ABSCESS DRAINAGE     Comment:  RECTAL 09/21/1998: ANAL SPHINCTEROTOMY No date: AUGMENTATION MAMMAPLASTY 10/27/2021: BREAST BIOPSY; Right 11/25/2021: BREAST LUMPECTOMY WITH RADIOACTIVE SEED AND SENTINEL  LYMPH NODE BIOPSY; Right     Comment:  Procedure: RIGHT BREAST LUMPECTOMY WITH RADIOACTIVE SEED              AND  SENTINEL LYMPH NODE BIOPSY;  Surgeon: Vanderbilt Ned, MD;  Location: [facility];                Service: General;  Laterality: Right;  90 MINUTES ROOM 8 08/31/2007: DIAGNOSTIC LAPAROSCOPY LEFT OOPHORECTOMY     Comment:  ENDOMETRIOMA 06/21/2012: LAPAROSCOPIC CHOLECYSTECTOMY 08/31/2013: LAPAROSCOPY; N/A     Comment:  Procedure: LAPAROSCOPY DIAGNOSTIC;  Surgeon: Alm JAYSON Cook, MD;  Location: [facility];                Service: Gynecology;  Laterality: N/A;  BMI    Body Mass Index: 26.31 kg/m      Reproductive/Obstetrics negative OB ROS  Breast cancer Endometriosis Ovarian mass s/p oophorectomy s/p hysterectomy                               Anesthesia Physical Anesthesia Plan  ASA: 2  Anesthesia Plan: General   Post-op Pain Management: Minimal or no pain anticipated   Induction: Intravenous  PONV Risk Score and Plan: 2 and Propofol  infusion and TIVA  Airway Management Planned: Nasal Cannula  Additional Equipment: None  Intra-op Plan:   Post-operative Plan:   Informed Consent: I have reviewed the patients  History and Physical, chart, labs and discussed the procedure including the risks, benefits and alternatives for the proposed anesthesia with the patient or authorized representative who has indicated his/her understanding and acceptance.     Dental advisory given  Plan Discussed with: CRNA and Surgeon  Anesthesia Plan Comments: (Discussed risks of anesthesia with patient, including possibility of difficulty with spontaneous ventilation under anesthesia necessitating airway intervention, PONV, and rare risks such as cardiac or respiratory or neurological events, and allergic reactions. Discussed the role of CRNA in patient's perioperative care. Patient understands.)        Anesthesia Quick Evaluation

## 2024-09-02 NOTE — Anesthesia Postprocedure Evaluation (Signed)
 Anesthesia Post Note  Patient: Carly Miller  Procedure(s) Performed: COLONOSCOPY  Patient location during evaluation: Endoscopy Anesthesia Type: General Level of consciousness: awake and alert Pain management: pain level controlled Vital Signs Assessment: post-procedure vital signs reviewed and stable Respiratory status: spontaneous breathing, nonlabored ventilation, respiratory function stable and patient connected to nasal cannula oxygen Cardiovascular status: blood pressure returned to baseline and stable Postop Assessment: no apparent nausea or vomiting Anesthetic complications: no   No notable events documented.   Last Vitals:  Vitals:   09/02/24 1117 09/02/24 1139  BP: 106/69 105/75  Pulse: 93 83  Resp: 14 18  Temp:  36.5 C  SpO2: 100% 100%    Last Pain:  Vitals:   09/02/24 1117  TempSrc:   PainSc: 0-No pain                 Debby Mines

## 2024-09-02 NOTE — Plan of Care (Signed)

## 2024-09-02 NOTE — Transfer of Care (Signed)
 Immediate Anesthesia Transfer of Care Note  Patient: Carly Miller  Procedure(s) Performed: COLONOSCOPY  Patient Location: PACU  Anesthesia Type:General  Level of Consciousness: awake, alert , and oriented  Airway & Oxygen Therapy: Patient Spontanous Breathing  Post-op Assessment: Report given to RN and Post -op Vital signs reviewed and stable  Post vital signs: Reviewed  Last Vitals:  Vitals Value Taken Time  BP    Temp    Pulse    Resp    SpO2      Last Pain:  Vitals:   09/02/24 0958  TempSrc: Temporal  PainSc: 0-No pain      Patients Stated Pain Goal: 3 (09/01/24 2000)  Complications: No notable events documented.

## 2024-09-02 NOTE — Op Note (Signed)
 [facility] Patient Name: Carly Miller Procedure Date: 09/02/2024 9:53 AM MRN: [id number] Account #: [id number] Date of Birth: [birth year] Admit Type: Inpatient Age: 48 Room: [facility] ENDO ROOM 4 Gender: Female Note Status: Finalized Instrument Name: Colon Scope [id number] Procedure:             Colonoscopy Indications:           Hematochezia, Abnormal CT of the GI tract Providers:             Ole Schick MD, MD Referring MD:          Alm BROCKS. Marget MD, MD (Referring MD) Medicines:             Monitored Anesthesia Care Complications:         No immediate complications. Estimated blood loss:                         Minimal. Procedure:             Pre-Anesthesia Assessment:                        - Prior to the procedure, a History and Physical was                         performed, and patient medications and allergies were                         reviewed. The patient is competent. The risks and                         benefits of the procedure and the sedation options and                         risks were discussed with the patient. All questions                         were answered and informed consent was obtained.                         Patient identification and proposed procedure were                         verified by the physician, the nurse, the                         anesthesiologist, the anesthetist and the technician                         in the endoscopy suite. Mental Status Examination:                         alert and oriented. Airway Examination: normal                         oropharyngeal airway and neck mobility. Respiratory                         Examination: clear to auscultation. CV Examination:  normal. Prophylactic Antibiotics: The patient does not                         require prophylactic antibiotics. Prior                         Anticoagulants: The patient has taken no anticoagulant                          or antiplatelet agents. ASA Grade Assessment: II - A                         patient with mild systemic disease. After reviewing                         the risks and benefits, the patient was deemed in                         satisfactory condition to undergo the procedure. The                         anesthesia plan was to use monitored anesthesia care                         (MAC). Immediately prior to administration of                         medications, the patient was re-assessed for adequacy                         to receive sedatives. The heart rate, respiratory                         rate, oxygen saturations, blood pressure, adequacy of                         pulmonary ventilation, and response to care were                         monitored throughout the procedure. The physical                         status of the patient was re-assessed after the                         procedure.                        After obtaining informed consent, the colonoscope was                         passed under direct vision. Throughout the procedure,                         the patient's blood pressure, pulse, and oxygen                         saturations were monitored continuously. The  Colonoscope was introduced through the anus and                         advanced to the the terminal ileum, with                         identification of the appendiceal orifice and IC                         valve. The colonoscopy was performed without                         difficulty. The patient tolerated the procedure well.                         The quality of the bowel preparation was adequate to                         identify polyps. The terminal ileum, ileocecal valve,                         appendiceal orifice, and rectum were photographed. Findings:      The perianal and digital rectal examinations were normal.      The terminal ileum appeared  normal.      A diffuse area of moderately erythematous mucosa was found in the       descending colon, at the splenic flexure and in the distal transverse       colon. Biopsies were taken with a cold forceps for histology. Estimated       blood loss was minimal.      The exam was otherwise without abnormality on direct and retroflexion       views. Impression:            - The examined portion of the ileum was normal.                        - Erythematous mucosa in the descending colon, at the                         splenic flexure and in the distal transverse colon.                         Biopsied.                        - The examination was otherwise normal on direct and                         retroflexion views. Recommendation:        - Return patient to hospital ward for possible                         discharge same day.                        - Advance diet as tolerated.                        - Continue present medications.                        -  Await pathology results. Suspect ischemic colitis                         given distribution and appearance. Procedure Code(s):     --- Professional ---                        [phone], Colonoscopy, flexible; with biopsy, single or                         multiple Diagnosis Code(s):     --- Professional ---                        K63.89, Other specified diseases of intestine                        K92.1, Melena (includes Hematochezia)                        R93.3, Abnormal findings on diagnostic imaging of                         other parts of digestive tract CPT copyright 2022 [employer]. All rights reserved. The codes documented in this report are preliminary and upon coder review may  be revised to meet current compliance requirements. Ole Schick MD, MD 09/02/2024 10:58:25 AM Number of Addenda: 0 Note Initiated On: 09/02/2024 9:53 AM Scope Withdrawal Time: 0 hours 6 minutes 1 second  Total Procedure  Duration: 0 hours 10 minutes 19 seconds  Estimated Blood Loss:  Estimated blood loss was minimal.      [facility]

## 2024-09-02 NOTE — Progress Notes (Signed)
 Reviewed discharge instructions with pt. Pt verbalizes understanding. IV removed. Tele removed. Pt has belongings

## 2024-09-02 NOTE — Plan of Care (Signed)
°  Problem: Education: Goal: Knowledge of General Education information will improve Description: Including pain rating scale, medication(s)/side effects and non-pharmacologic comfort measures 09/02/2024 1137 by Marvyn Torrez K, RN Outcome: Adequate for Discharge 09/02/2024 1043 by Freeda Leon POUR, RN Outcome: Progressing   Problem: Health Behavior/Discharge Planning: Goal: Ability to manage health-related needs will improve 09/02/2024 1137 by Freeda Leon POUR, RN Outcome: Adequate for Discharge 09/02/2024 1043 by Freeda Leon POUR, RN Outcome: Progressing   Problem: Clinical Measurements: Goal: Ability to maintain clinical measurements within normal limits will improve 09/02/2024 1137 by [place] Churchill K, RN Outcome: Adequate for Discharge 09/02/2024 1043 by Freeda Leon POUR, RN Outcome: Progressing Goal: Will remain free from infection 09/02/2024 1137 by Freeda Leon POUR, RN Outcome: Adequate for Discharge 09/02/2024 1043 by Freeda Leon POUR, RN Outcome: Progressing Goal: Diagnostic test results will improve 09/02/2024 1137 by Freeda Leon POUR, RN Outcome: Adequate for Discharge 09/02/2024 1043 by Freeda Leon POUR, RN Outcome: Progressing Goal: Respiratory complications will improve 09/02/2024 1137 by Jaimie Pippins K, RN Outcome: Adequate for Discharge 09/02/2024 1043 by Freeda Leon POUR, RN Outcome: Progressing Goal: Cardiovascular complication will be avoided 09/02/2024 1137 by Sergio Zawislak K, RN Outcome: Adequate for Discharge 09/02/2024 1043 by Kolyn Rozario K, RN Outcome: Progressing   Problem: Activity: Goal: Risk for activity intolerance will decrease 09/02/2024 1137 by Freeda Leon POUR, RN Outcome: Adequate for Discharge 09/02/2024 1043 by Freeda Leon POUR, RN Outcome: Progressing   Problem: Nutrition: Goal: Adequate nutrition will be maintained 09/02/2024 1137 by Freeda Leon POUR, RN Outcome: Adequate for Discharge 09/02/2024 1043 by Freeda Leon POUR, RN Outcome: Progressing   Problem: Coping: Goal: Level of anxiety will decrease 09/02/2024 1137 by Freeda Leon POUR, RN Outcome: Adequate for Discharge 09/02/2024 1043 by Freeda Leon POUR, RN Outcome: Progressing   Problem: Elimination: Goal: Will not experience complications related to bowel motility 09/02/2024 1137 by Freeda Leon POUR, RN Outcome: Adequate for Discharge 09/02/2024 1043 by Freeda Leon POUR, RN Outcome: Progressing Goal: Will not experience complications related to urinary retention 09/02/2024 1137 by Freeda Leon POUR, RN Outcome: Adequate for Discharge 09/02/2024 1043 by Freeda Leon POUR, RN Outcome: Progressing   Problem: Pain Managment: Goal: General experience of comfort will improve and/or be controlled 09/02/2024 1137 by Arihana Ambrocio K, RN Outcome: Adequate for Discharge 09/02/2024 1043 by Freeda Leon POUR, RN Outcome: Progressing   Problem: Safety: Goal: Ability to remain free from injury will improve 09/02/2024 1137 by Freeda Leon POUR, RN Outcome: Adequate for Discharge 09/02/2024 1043 by Freeda Leon POUR, RN Outcome: Progressing   Problem: Skin Integrity: Goal: Risk for impaired skin integrity will decrease 09/02/2024 1137 by Freeda Leon POUR, RN Outcome: Adequate for Discharge 09/02/2024 1043 by Logan Vegh K, RN Outcome: Progressing

## 2024-09-02 NOTE — Discharge Summary (Signed)
 Physician Discharge Summary   Patient: Carly Miller MRN: [id number] DOB: Jan 16, 1976  Admit date:     08/31/2024  Discharge date: 09/02/2024  Discharge Physician: Nena Rebel   PCP: Marget Lenis, MD   Recommendations at discharge:   Continue taking Cipro  and Flagyl  for a week duration. Follow-up with GI as outpatient for to review the biopsy result. Continue with primary care provider within 2 weeks  Discharge Diagnoses: Principal Problem:   Hemorrhagic colitis Active Problems:   History of breast cancer   Peripheral neuropathy   Depression  Resolved Problems:   * No resolved hospital problems. *  Hospital Course: Carly Miller is a 48 y.o. lady with history of breast cancer, HLD, and endometriosis here with history of abdominal pain and hematochezia. This started suddenly yesterday. Never had something like this before. Had a normal colonoscopy last year. CT imaging suggested infectious/inflammatory colitis. C. Diff has been ruled out but rest of PCR test is pending. Passing clots but has improved today. No blood thinners. History of complicated abdominal surgery due intrabdominal abscess. No fevers currently. Abdominal pain started first and then diarrhea/hematochezia. No sick contacts. No weird food intake. Husband ate same thing as her and did not get sick.  Patient underwent flexible colonoscopy which revealed erythematous mucosa in the descending colon which was biopsied.  Gastroenterologist advised to start regular diet and discharge patient home and follow-up with GI as outpatient for biopsy result. Patient was discharged home.  Continue Cipro  and Flagyl  for 5 days  Assessment and Plan: 48-year female with history of breast cancer, HLD, endometriosis who was admitted for abdominal pain and hematochezia.  Suspected ischemic colitis.  Ischemic versus infective colitis- Evaluated by GI, patient underwent flexible colonoscopy and found to have erythematous colon mucosa  descending colon.  Biopsy was done.  GI advised that patient can eat and get discharged home and follow-up with GI as outpatient within 2 weeks.  Patient has been discharged on Cipro  and Flagyl  for 5 days follow-up with GI as outpatient.  Depression - Will continue home Lexapro .   Peripheral neuropathy - Continue home Neurontin .   History of breast cancer - Will continue home tamoxifen         Pain control - [place]  Controlled Substance Reporting System database was reviewed. and patient was instructed, not to drive, operate heavy machinery, perform activities at heights, swimming or participation in water activities or provide baby-sitting services while on Pain, Sleep and Anxiety Medications; until their outpatient Physician has advised to do so again. Also recommended to not to take more than prescribed Pain, Sleep and Anxiety Medications.  Consultants: Gastroenterology Procedures performed: Colonoscopy Disposition: Home Diet recommendation:  Regular diet DISCHARGE MEDICATION: Allergies as of 09/02/2024       Reactions   Naproxen Sodium Other (See Comments), Anaphylaxis, Hives   Feel like I'm choking when I take it feels like I'm choking Feel like I'm choking when I take it   Naproxen Other (See Comments)        Medication List     TAKE these medications    ciprofloxacin  500 MG tablet Commonly known as: Cipro  Take 1 tablet (500 mg total) by mouth 2 (two) times daily for 10 doses.   escitalopram  10 MG tablet Commonly known as: LEXAPRO  Take 10 mg by mouth daily.   gabapentin  300 MG capsule Commonly known as: NEURONTIN  TAKE 1 CAPSULE BY MOUTH 3 TIMES DAILY AS NEEDED.   metroNIDAZOLE  500 MG tablet Commonly known as: FLAGYL  Take  1 tablet (500 mg total) by mouth 3 (three) times daily.   multivitamin capsule Take 1 capsule by mouth daily.   ondansetron  4 MG tablet Commonly known as: ZOFRAN  Take 4 mg by mouth every 8 (eight) hours as needed. for  nausea   tamoxifen  20 MG tablet Commonly known as: NOLVADEX  TAKE 1 TABLET BY MOUTH EVERY DAY   zolpidem  10 MG tablet Commonly known as: AMBIEN  zolpidem  10 mg tablet  TAKE ONE TABLET AT BEDTIME AS NEEDED FOR INSOMNIA        Follow-up Information     [facility].   Specialty: Emergency Medicine Why: If symptoms worsen Contact information: [address] [place]  [address] [phone]        Schedule an appointment as soon as possible for a visit  with Unk Corinn Skiff, MD.   Specialty: Gastroenterology Contact information: [address] [place] [address] [phone]                Discharge Exam: Fredricka Weights   08/31/24 1737 08/31/24 1750  Weight: 73.9 kg 73.9 kg   Seen and examined at bedside Patient was alert awake and oriented not in distress. Rest of the exam was unremarkable   Condition at discharge: stable  The results of significant diagnostics from this hospitalization (including imaging, microbiology, ancillary and laboratory) are listed below for reference.   Imaging Studies: CT Angio Abd/Pel W and/or Wo Contrast Result Date: 09/01/2024 EXAM: CTA ABDOMEN AND PELVIS WITHOUT AND WITH CONTRAST 09/01/2024 12:24:29 AM TECHNIQUE: CTA images of the abdomen and pelvis without and with 100 mL iohexol  350 mg/mL intravenous contrast. Three-dimensional MIP/volume rendered formations were performed. Automated exposure control, iterative reconstruction, and/or weight based adjustment of the mA/kV was utilized to reduce the radiation dose to as low as reasonably achievable. COMPARISON: None available. CLINICAL HISTORY: Lower GI bleed FINDINGS: VASCULATURE: AORTA: Atherosclerotic calcifications of the abdominal aorta. No acute finding. No abdominal aortic aneurysm. No dissection. CELIAC TRUNK: No acute finding. No occlusion or significant stenosis. SUPERIOR MESENTERIC ARTERY: No acute  finding. No occlusion or significant stenosis. RENAL ARTERIES: No acute finding. No occlusion or significant stenosis. ILIAC ARTERIES: No acute finding. No occlusion or significant stenosis. LIVER: The liver is unremarkable. GALLBLADDER AND BILE DUCTS: Status post cholecystectomy. No biliary ductal dilatation. SPLEEN: The spleen is unremarkable. PANCREAS: The pancreas is unremarkable. ADRENAL GLANDS: Bilateral adrenal glands demonstrate no acute abnormality. KIDNEYS, URETERS AND BLADDER: No stones in the kidneys or ureters. No hydronephrosis. No perinephric or periureteral stranding. Urinary bladder is unremarkable. GI AND BOWEL: Stomach demonstrates a small hiatal hernia. Duodenal sweep demonstrates no acute abnormality. Long segment wall thickening with pericolonic stranding involving the transverse and descending colon (postcontrast image 103), reflecting infectious or inflammatory colitis. Normal appendix (image 62). There is no bowel obstruction. Following contrast administration, there is no intraluminal spillage of contrast to suggest active GI bleeding. REPRODUCTIVE: Status post hysterectomy. PERITONEUM AND RETRPERITONEUM: No ascites or free air. LUNG BASE: No acute abnormality. LYMPH NODES: No lymphadenopathy. BONES AND SOFT TISSUES: Bilateral breast augmentation. No acute abnormality of the bones. No acute soft tissue abnormality. IMPRESSION: 1. No active gastrointestinal bleeding. 2. Long segment infectious/inflammatory colitis involving the transverse and descending colon. Electronically signed by: Pinkie Pebbles MD 09/01/2024 12:28 AM EST RP Workstation: [id number]    Microbiology: Results for orders placed or performed during the hospital encounter of 08/31/24  C Difficile Quick Screen w PCR reflex     Status:  None   Collection Time: 09/01/24 11:05 AM   Specimen: STOOL  Result Value Ref Range Status   C Diff antigen NEGATIVE NEGATIVE Final   C Diff toxin NEGATIVE NEGATIVE Final   C Diff  interpretation No C. difficile detected.  Final    Comment: Performed at [facility], [address] [place], [place] [address]    Labs: CBC: Recent Labs  Lab 08/31/24 1752 08/31/24 2230 09/01/24 1116 09/01/24 1812 09/02/24 0042 09/02/24 0407  WBC 10.4  --   --   --   --  8.8  HGB 13.2 13.2 12.8 11.8* 12.3 11.7*  HCT 39.3 40.2 38.4 35.9* 36.1 35.0*  MCV 90.3  --   --   --   --  89.3  PLT 334  --   --   --   --  292   Basic Metabolic Panel: Recent Labs  Lab 08/31/24 1752 09/02/24 0407  NA 139 141  K 4.2 3.6  CL 102 106  CO2 25 23  GLUCOSE 86 84  BUN 13 5*  CREATININE 0.73 0.55  CALCIUM 9.1 8.6*   Liver Function Tests: Recent Labs  Lab 08/31/24 1752  AST 16  ALT 9  ALKPHOS 66  BILITOT 0.3  PROT 6.8  ALBUMIN 4.3   CBG: No results for input(s): GLUCAP in the last 168 hours.  Discharge time spent: greater than 30 minutes.  Signed: Nena Rebel, MD [facility] 09/02/2024

## 2024-09-02 NOTE — Plan of Care (Signed)
   Problem: Education: Goal: Knowledge of General Education information will improve Description: Including pain rating scale, medication(s)/side effects and non-pharmacologic comfort measures Outcome: Progressing   Problem: Clinical Measurements: Goal: Will remain free from infection Outcome: Progressing   Problem: Clinical Measurements: Goal: Diagnostic test results will improve Outcome: Progressing

## 2024-09-03 ENCOUNTER — Other Ambulatory Visit: Payer: Self-pay

## 2024-09-05 ENCOUNTER — Encounter: Payer: Self-pay | Admitting: Gastroenterology

## 2024-09-05 LAB — SURGICAL PATHOLOGY

## 2024-09-06 NOTE — Progress Notes (Signed)
 "   PATIENT PROFILE: Carly Miller is a 48 y.o. female who presents to the [facility] for a hospital follow up.  HISTORY OF PRESENT ILLNESS: Carly Miller is a 48 year old female with a past medical history of estrogen receptor positive breast cancer who presents today for hospital follow up. She was admitted on 08/31/14 for abdominal pain, diarrhea, and hematochezia. CT revealed colitis of the transverse and descending colon. She underwent colonoscopy with Dr Maryruth on 09/02/24 which revealed erythematous mucosa extending from descending colon to distal transverse colon. Biopsies revealed changes consistent with ischemia. Discharged home on cipro  and flagyl  x 5 days with close GI follow up.   She states she went to the hospital due to a sudden onset of abdominal pain, severe diarrhea, hematochezia, and feeling pre-syncopal. She states she has never had an episode like this previously. Overall, she is feeling well today. She states her stools are soft without diarrhea, urgency, hematochezia, or melena. She still has mild residual abdominal discomfort, but denies any changes to appetite, nausea, vomiting, or fever.   Her blood pressure today was 104/68. She states this is typical for her but denies any history of symptomatic hypotension or other cardiac history. She states she typically drinks plenty of fluids throughout the day. She denies significant constipation, having bowel movents typically every other day, but does endorse some occasional straining. She denies frequent nsaid use.   ROS: denies fever, chills, night sweats, unintentional weight loss, chest pain, shortness of breath, heartburn, reflux, dysphagia, odynophagia, nausea, vomiting, diarrhea, melena, or hematochezia.   Prior Scopes: Colonoscopy:  09/02/24- [facility]- erythematous mucosa extending from descending colon to distal transverse colon. Biopsies revealed changes consistent with ischemia. 09/2022-unremarkable with 10 year  repeat  EGD: none prior  Denies family history of GI related cancers.  Endorses prior abdominal surgery: oophorectomy, open abdominal surgery for complicated tuboovarian abscess, cholecystectomy, hysterectomy with lysis of adhesions  Denies frequent NSAID use, social alcohol use, denies tobacco use, or other drug use.  GENERAL REVIEW OF SYSTEMS:  Review of Systems  Constitutional:  Negative for chills, fever and unexpected weight change.  HENT:  Negative for congestion, sore throat, trouble swallowing and voice change.   Eyes:  Negative for visual disturbance.  Respiratory:  Negative for cough and shortness of breath.   Cardiovascular:  Negative for chest pain.  Gastrointestinal:        See HPI  Genitourinary:  Negative for dysuria.  Musculoskeletal:  Negative for joint swelling.  Skin:  Negative for color change.  Neurological:  Negative for syncope, weakness and light-headedness.  Psychiatric/Behavioral:  Negative for confusion.     MEDICATIONS: Outpatient Encounter Medications as of 09/07/2024  Medication Sig Dispense Refill   escitalopram  oxalate (LEXAPRO ) 10 MG tablet Take 10 mg by mouth once daily     tamoxifen  (NOLVADEX ) 10 MG tablet Take 10 mg by mouth     No facility-administered encounter medications on file as of 09/07/2024.    ALLERGIES: Naproxen sodium  PAST MEDICAL HISTORY: Past Medical History:  Diagnosis Date   Hyperlipidemia     PAST SURGICAL HISTORY: Past Surgical History:  Procedure Laterality Date   HYSTERECTOMY       FAMILY HISTORY: Family History  Problem Relation Name Age of Onset   Obesity Mother     Hyperlipidemia (Elevated cholesterol) Mother     Diabetes Father       SOCIAL HISTORY: Social History   Socioeconomic History   Marital status: Unknown  Tobacco  Use   Smoking status: Never   Smokeless tobacco: Never  Substance and Sexual Activity   Alcohol use: Not Currently   Drug use: Not Currently   Social Drivers  of Health   Financial Resource Strain: Low Risk  (09/07/2024)   Overall Financial Resource Strain (CARDIA)    Difficulty of Paying Living Expenses: Not very hard  Food Insecurity: No Food Insecurity (09/07/2024)   Hunger Vital Sign    Worried About Running Out of Food in the Last Year: Never true    Ran Out of Food in the Last Year: Never true  Transportation Needs: No Transportation Needs (09/07/2024)   PRAPARE - Administrator, Civil Service (Medical): No    Lack of Transportation (Non-Medical): No    Vitals:   09/07/24 1128  BP: 104/68  BP Location: Left upper arm  Pulse: 78  Temp: 36.1 C (96.9 F)  TempSrc: Oral  Weight: 74.1 kg (163 lb 6.4 oz)  Height: 167.6 cm (5' 6)    Wt Readings from Last 3 Encounters:  09/07/24 74.1 kg (163 lb 6.4 oz)  12/16/21 71.7 kg (158 lb)     PHYSICAL EXAM: Physical Exam Vitals reviewed.  Constitutional:      General: She is not in acute distress.    Appearance: Normal appearance. She is not toxic-appearing.  HENT:     Head: Normocephalic and atraumatic.  Eyes:     General: No scleral icterus.    Extraocular Movements: Extraocular movements intact.  Cardiovascular:     Rate and Rhythm: Normal rate and regular rhythm.     Heart sounds: No murmur heard.    No friction rub. No gallop.  Pulmonary:     Effort: Pulmonary effort is normal.     Breath sounds: Normal breath sounds. No wheezing, rhonchi or rales.  Abdominal:     General: Abdomen is flat. Bowel sounds are normal. There is no distension.     Palpations: Abdomen is soft.     Tenderness: There is abdominal tenderness (mildly ttp throughout). There is no guarding or rebound.  Musculoskeletal:     Cervical back: Neck supple. No tenderness.     Right lower leg: No edema.     Left lower leg: No edema.  Lymphadenopathy:     Cervical: No cervical adenopathy.  Skin:    General: Skin is warm and dry.     Coloration: Skin is not jaundiced.  Neurological:      General: No focal deficit present.     Mental Status: She is alert and oriented to person, place, and time.  Psychiatric:        Mood and Affect: Mood normal.        Behavior: Behavior normal.        Thought Content: Thought content normal.    REVIEW OF DATA: I have reviewed the following data today:  Labs: 09/02/24 BMP sig for calcium 8.6, BUN 5 CBC- sig for hgb 11.7, hct 35  Imaging: 09/01/24 IMPRESSION: 1. No active gastrointestinal bleeding. 2. Long segment infectious/inflammatory colitis involving the transverse and descending colon.   Encounters: 08/31/24 hospital admission- abd pain, diarrhea, hematochezia- ischemic colitis   ASSESSMENT AND PLAN: Ms. Wyss is a 48 y.o. female presenting for hospital follow up.  Assessment & Plan History of ischemic colitis Recent episode 12/11 confirmed by CT and colonoscopy. Patient is stable today and largely asymptomatic from GI perspective.  -Educated on signs, symptoms, risk factors, management, and prevention strategies of ischemic  colitis. -Educated that most episodes occur with spontaneous resolution. Discussed signs/symptoms that prompt urgent evaluation. - Discussed the importance of adequate hydration. Work to increase daily fluid intake. - BP 104/68 today without history of symptomatic hypotension. Monitor blood pressure daily, keep a log. - Consider follow up with primary care if blood pressure trends low as this is a risk factor for ischemic colitis. - Consider daily fiber supplement like Benefiber to prevent constipation.  All questions were answered and patient expresses understanding.  Follow up: as needed.   Attestation Statement:   I personally performed the service, non-incident to. [facility])   LYNDA JOESPH RUMMER, PA  Lynda Rummer, PA-C  [facility]  [place], [place]   This note has been created using automated tools and reviewed for accuracy by [facility].  "

## 2024-12-11 ENCOUNTER — Ambulatory Visit

## 2025-07-27 ENCOUNTER — Inpatient Hospital Stay: Admitting: Hematology and Oncology
